# Patient Record
Sex: Female | Born: 1950 | Race: White | Hispanic: No | Smoking: Former smoker
Health system: Southern US, Community
[De-identification: ages and names within clinical notes are randomized; demographics above are authoritative.]

## PROBLEM LIST (undated history)

## (undated) DIAGNOSIS — G43909 Migraine, unspecified, not intractable, without status migrainosus: Secondary | ICD-10-CM

## (undated) DIAGNOSIS — G4730 Sleep apnea, unspecified: Secondary | ICD-10-CM

## (undated) DIAGNOSIS — F419 Anxiety disorder, unspecified: Secondary | ICD-10-CM

## (undated) DIAGNOSIS — M109 Gout, unspecified: Secondary | ICD-10-CM

## (undated) DIAGNOSIS — N183 Chronic kidney disease, stage 3 unspecified: Secondary | ICD-10-CM

## (undated) DIAGNOSIS — G8929 Other chronic pain: Secondary | ICD-10-CM

## (undated) DIAGNOSIS — C50919 Malignant neoplasm of unspecified site of unspecified female breast: Secondary | ICD-10-CM

## (undated) DIAGNOSIS — J449 Chronic obstructive pulmonary disease, unspecified: Secondary | ICD-10-CM

## (undated) DIAGNOSIS — B009 Herpesviral infection, unspecified: Secondary | ICD-10-CM

## (undated) DIAGNOSIS — E119 Type 2 diabetes mellitus without complications: Secondary | ICD-10-CM

## (undated) DIAGNOSIS — K760 Fatty (change of) liver, not elsewhere classified: Secondary | ICD-10-CM

## (undated) DIAGNOSIS — Z8709 Personal history of other diseases of the respiratory system: Secondary | ICD-10-CM

## (undated) DIAGNOSIS — K219 Gastro-esophageal reflux disease without esophagitis: Secondary | ICD-10-CM

## (undated) DIAGNOSIS — I10 Essential (primary) hypertension: Secondary | ICD-10-CM

## (undated) DIAGNOSIS — K449 Diaphragmatic hernia without obstruction or gangrene: Secondary | ICD-10-CM

## (undated) DIAGNOSIS — E669 Obesity, unspecified: Secondary | ICD-10-CM

## (undated) DIAGNOSIS — Z8669 Personal history of other diseases of the nervous system and sense organs: Secondary | ICD-10-CM

## (undated) DIAGNOSIS — E785 Hyperlipidemia, unspecified: Secondary | ICD-10-CM

## (undated) HISTORY — DX: Gout, unspecified: M10.9

## (undated) HISTORY — DX: Gastro-esophageal reflux disease without esophagitis: K21.9

## (undated) HISTORY — PX: CHOLECYSTECTOMY: SHX55

## (undated) HISTORY — DX: Personal history of other diseases of the respiratory system: Z87.09

## (undated) HISTORY — DX: Diaphragmatic hernia without obstruction or gangrene: K44.9

## (undated) HISTORY — DX: Obesity, unspecified: E66.9

## (undated) HISTORY — DX: Chronic obstructive pulmonary disease, unspecified: J44.9

## (undated) HISTORY — PX: MOLE REMOVAL: SHX2046

## (undated) HISTORY — PX: BLADDER SURGERY: SHX569

## (undated) HISTORY — DX: Hyperlipidemia, unspecified: E78.5

## (undated) HISTORY — DX: Herpesviral infection, unspecified: B00.9

## (undated) HISTORY — DX: Personal history of other diseases of the nervous system and sense organs: Z86.69

## (undated) HISTORY — DX: Migraine, unspecified, not intractable, without status migrainosus: G43.909

## (undated) HISTORY — DX: Other chronic pain: G89.29

## (undated) HISTORY — PX: ABDOMINAL HYSTERECTOMY: SHX81

## (undated) HISTORY — DX: Fatty (change of) liver, not elsewhere classified: K76.0

---

## 2003-05-15 ENCOUNTER — Other Ambulatory Visit: Admission: RE | Admit: 2003-05-15 | Discharge: 2003-05-15 | Payer: Self-pay | Admitting: Unknown Physician Specialty

## 2004-11-09 ENCOUNTER — Ambulatory Visit: Payer: Self-pay | Admitting: Internal Medicine

## 2004-11-15 ENCOUNTER — Ambulatory Visit: Payer: Self-pay | Admitting: Internal Medicine

## 2004-11-15 ENCOUNTER — Ambulatory Visit (HOSPITAL_COMMUNITY): Admission: RE | Admit: 2004-11-15 | Discharge: 2004-11-15 | Payer: Self-pay | Admitting: Internal Medicine

## 2004-11-15 DIAGNOSIS — K449 Diaphragmatic hernia without obstruction or gangrene: Secondary | ICD-10-CM

## 2004-11-15 HISTORY — PX: ESOPHAGOGASTRODUODENOSCOPY: SHX1529

## 2004-11-15 HISTORY — DX: Diaphragmatic hernia without obstruction or gangrene: K44.9

## 2004-11-17 ENCOUNTER — Ambulatory Visit (HOSPITAL_COMMUNITY): Admission: RE | Admit: 2004-11-17 | Discharge: 2004-11-17 | Payer: Self-pay | Admitting: Internal Medicine

## 2004-11-17 IMAGING — NM NM HEPATO W/GB/PHARM/[PERSON_NAME]
2 series · 12 of 12 positions shown · non-contrast
Comparison: none

CLINICAL DATA: Nausea.
 NUCLEAR MEDICINE HEPATOBILIARY SCAN
 Hepatobiliary study was performed after the IV administration of 5.0 mCi [6T] Choletec.  Images were performed at different intervals up to one hour.

[Series 1: hepatobiliary · 3.20mm/px · 6 of 60 frames shown (1 of 2)]
[frame 6/60]
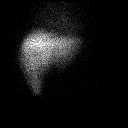
[frame 16/60]
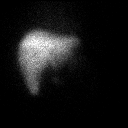
[frame 26/60]
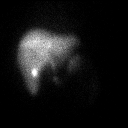
[frame 36/60]
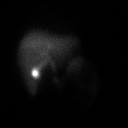
[frame 46/60]
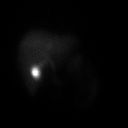
[frame 56/60]
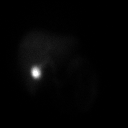

[Series 1: hepatobiliary · 3.20mm/px · 6 of 57 frames shown (2 of 2)]
[frame 5/57]
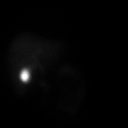
[frame 15/57]
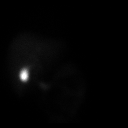
[frame 24/57]
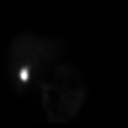
[frame 34/57]
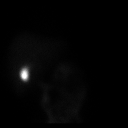
[frame 43/57]
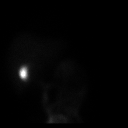
[frame 53/57]
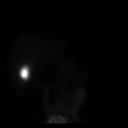

[12 of 12 positions shown; findings below may reference images not displayed]

The liver was seen promptly followed by the biliary ducts and then the gallbladder in the first 22 minutes of the study.  Intestinal activity was seen approximately by 15 minutes.  At the end of one hour, most of the activity had cleared from the liver and was concentrated in the gallbladder and small intestine.  Post fatty meal challenge, there was more passage of the tracer into the small bowel and the gallbladder emptied with an ejection fraction of 9% (normal greater than 35%).
IMPRESSION: Decreased gallbladder ejection fraction of 9% suggestive of a severe degree of biliary dyskinesia /cystic duct syndrome.

## 2010-04-06 ENCOUNTER — Encounter: Payer: Self-pay | Admitting: Cardiology

## 2010-05-05 ENCOUNTER — Encounter: Payer: Self-pay | Admitting: Cardiology

## 2010-07-06 ENCOUNTER — Encounter: Payer: Self-pay | Admitting: Cardiology

## 2010-07-07 ENCOUNTER — Encounter: Payer: Self-pay | Admitting: Cardiology

## 2010-07-07 ENCOUNTER — Ambulatory Visit: Payer: Self-pay | Admitting: Cardiology

## 2010-07-22 ENCOUNTER — Encounter: Payer: Self-pay | Admitting: Cardiology

## 2010-08-24 ENCOUNTER — Ambulatory Visit: Payer: Self-pay | Admitting: Cardiology

## 2010-08-24 DIAGNOSIS — E785 Hyperlipidemia, unspecified: Secondary | ICD-10-CM | POA: Insufficient documentation

## 2010-08-24 DIAGNOSIS — E7800 Pure hypercholesterolemia, unspecified: Secondary | ICD-10-CM | POA: Insufficient documentation

## 2010-08-24 DIAGNOSIS — E119 Type 2 diabetes mellitus without complications: Secondary | ICD-10-CM | POA: Insufficient documentation

## 2010-08-24 DIAGNOSIS — K219 Gastro-esophageal reflux disease without esophagitis: Secondary | ICD-10-CM | POA: Insufficient documentation

## 2010-08-24 DIAGNOSIS — G4730 Sleep apnea, unspecified: Secondary | ICD-10-CM | POA: Insufficient documentation

## 2010-08-24 DIAGNOSIS — R079 Chest pain, unspecified: Secondary | ICD-10-CM | POA: Insufficient documentation

## 2010-08-24 DIAGNOSIS — I10 Essential (primary) hypertension: Secondary | ICD-10-CM | POA: Insufficient documentation

## 2010-08-24 DIAGNOSIS — E118 Type 2 diabetes mellitus with unspecified complications: Secondary | ICD-10-CM | POA: Insufficient documentation

## 2010-08-24 DIAGNOSIS — J45909 Unspecified asthma, uncomplicated: Secondary | ICD-10-CM | POA: Insufficient documentation

## 2010-11-16 NOTE — Consult Note (Signed)
Summary: CARDIOLOGY CONSULT/ [facility]  CARDIOLOGY CONSULT/ [facility]   Imported By: Zachary George 08/23/2010 16:46:10  _____________________________________________________________________  External Attachment:    Type:   Image     Comment:   External Document

## 2010-11-16 NOTE — Letter (Signed)
Summary: Discharge Summary/ [facility]  Discharge Summary/ [facility]   Imported By: Dorise Hiss 08/24/2010 08:41:55  _____________________________________________________________________  External Attachment:    Type:   Image     Comment:   External Document

## 2010-11-16 NOTE — Letter (Signed)
Summary: Internal Other/ PATIENT HISTORY FORM  Internal Other/ PATIENT HISTORY FORM   Imported By: Dorise Hiss 08/26/2010 09:06:13  _____________________________________________________________________  External Attachment:    Type:   Image     Comment:   External Document

## 2010-11-16 NOTE — Letter (Signed)
Summary: External Correspondence/ OFFICE VISIT DR. HASANAJ  External Correspondence/ OFFICE VISIT DR. HASANAJ   Imported By: Dorise Hiss 08/16/2010 12:19:09  _____________________________________________________________________  External Attachment:    Type:   Image     Comment:   External Document
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
## 2011-03-04 NOTE — Op Note (Signed)
Beth Phelps, Beth Phelps                ACCOUNT NO.:  [id number]   MEDICAL RECORD NO.:  [id number]          PATIENT TYPE:  AMB   LOCATION:  DAY                           FACILITY:  [facility]   PHYSICIAN:  Lionel December, M.D.    DATE OF BIRTH:  1951/02/14   DATE OF PROCEDURE:  11/15/2004  DATE OF DISCHARGE:                                 OPERATIVE REPORT   PROCEDURE:  Esophagogastroduodenoscopy.   INDICATIONS:  Beth Phelps is a 60 year old Caucasian female with nausea of four  months' duration associated with postprandial abdominal pain.  She has  chronic GERD and her symptoms are well controlled with PPI, but not her  other symptoms.  She had an ultrasound recently revealing a fatty liver, but  no evidence of cholelithiasis or dilated bile duct.  Her LFTs are  essentially normal, except for borderline alkaline phosphatase.  Her AST was  19 and ALT 22 (November 09, 2004).  Her CBC was normal, except for a WBC of  11 and an MCV of 79.3.  H&H was 13.5 and 41.1.  She is undergoing diagnostic  EGD.  The procedure risks were reviewed with the patient and informed  consent was obtained.   PREOPERATIVE MEDICATIONS:  Cetacaine spray for pharyngeal topical anesthesia  and Demerol 25 mg IV and Versed 6 mg IV in divided dose.   FINDINGS:  Procedure performed in endoscopy suite.  The patient's vital  signs and O2 saturations were monitored during the procedure and remained  stable.  The patient was placed in the left lateral decubitus position and  the Olympus was passed per oropharynx without any difficulty into the  esophagus.   Esophagus:  Mucosa of the esophagus normal.  The squamocolumnar junction was  located at 33 cm from the incisors.  There was focal erythema at the GE  junction.  The hiatus was at 37 cm from the incisors.   Stomach:  It was empty and distended for evaluation insufflation.  Folds of  the proximal stomach were normal.  Examination of the mucosa revealed normal  appearance of body,  antrum, pyloric channel, as well as angularis, fundus  and cardia.   Duodenal and bulbar mucosa were normal.  The scope was passed to the second  part of the duodenum where mucosa and folds were normal.  The endoscope was  withdrawn.  The patient tolerated the procedure well.   FINAL DIAGNOSES:  1.  Small sliding hiatal hernia with mild changes of reflux esophagitis      limited to gastroesophageal junction.  2.  No evidence of peptic ulcer disease or complicated gastrointestinal      reflux disease.  3.  Suspect her nausea may be part of her depression given postprandial      pain.  Would rule out biliary dyskinesia.   RECOMMENDATIONS:  1.  Levsin SL before each meal.  2.  HIDA scan with CCK.  The patient was advised not to take any Levsin for      24 hours prior to this procedure.      NR/MEDQ  D:  11/15/2004  T:  11/15/2004  Job:  [id number]   cc:   Earleen Newport, P.A.  [facility] Department
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
## 2011-04-14 ENCOUNTER — Encounter: Payer: Self-pay | Admitting: Cardiology

## 2012-02-28 ENCOUNTER — Ambulatory Visit: Payer: Self-pay | Admitting: Internal Medicine

## 2012-04-03 ENCOUNTER — Telehealth: Payer: Self-pay | Admitting: Internal Medicine

## 2012-04-03 ENCOUNTER — Ambulatory Visit: Payer: Self-pay | Admitting: Internal Medicine

## 2012-04-03 NOTE — Telephone Encounter (Signed)
Pt was a no show

## 2012-09-16 HISTORY — PX: COLONOSCOPY: SHX174

## 2013-02-03 DIAGNOSIS — R059 Cough, unspecified: Secondary | ICD-10-CM

## 2013-02-03 DIAGNOSIS — R05 Cough: Secondary | ICD-10-CM

## 2014-03-25 ENCOUNTER — Other Ambulatory Visit: Payer: Self-pay | Admitting: Internal Medicine

## 2014-03-25 DIAGNOSIS — N63 Unspecified lump in unspecified breast: Secondary | ICD-10-CM

## 2014-04-03 ENCOUNTER — Other Ambulatory Visit: Payer: Self-pay | Admitting: Internal Medicine

## 2014-04-03 ENCOUNTER — Ambulatory Visit
Admission: RE | Admit: 2014-04-03 | Discharge: 2014-04-03 | Disposition: A | Discharge status: Home / Self Care | Payer: Medicare Other | Source: Ambulatory Visit | Attending: Internal Medicine | Admitting: Internal Medicine

## 2014-04-03 ENCOUNTER — Encounter (INDEPENDENT_AMBULATORY_CARE_PROVIDER_SITE_OTHER): Payer: Self-pay

## 2014-04-03 DIAGNOSIS — N63 Unspecified lump in unspecified breast: Secondary | ICD-10-CM

## 2014-05-07 ENCOUNTER — Emergency Department (HOSPITAL_COMMUNITY): Payer: Medicare Other

## 2014-05-07 ENCOUNTER — Observation Stay (HOSPITAL_COMMUNITY)
Admission: EM | Admit: 2014-05-07 | Discharge: 2014-05-09 | Disposition: A | Discharge status: Home / Self Care | Payer: Medicare Other | Attending: Family Medicine | Admitting: Family Medicine

## 2014-05-07 ENCOUNTER — Encounter (HOSPITAL_COMMUNITY): Payer: Self-pay | Admitting: Emergency Medicine

## 2014-05-07 DIAGNOSIS — Z87891 Personal history of nicotine dependence: Secondary | ICD-10-CM | POA: Diagnosis not present

## 2014-05-07 DIAGNOSIS — K219 Gastro-esophageal reflux disease without esophagitis: Secondary | ICD-10-CM | POA: Diagnosis not present

## 2014-05-07 DIAGNOSIS — R1032 Left lower quadrant pain: Principal | ICD-10-CM | POA: Insufficient documentation

## 2014-05-07 DIAGNOSIS — K625 Hemorrhage of anus and rectum: Secondary | ICD-10-CM | POA: Diagnosis not present

## 2014-05-07 DIAGNOSIS — J45909 Unspecified asthma, uncomplicated: Secondary | ICD-10-CM | POA: Insufficient documentation

## 2014-05-07 DIAGNOSIS — Z79899 Other long term (current) drug therapy: Secondary | ICD-10-CM | POA: Diagnosis not present

## 2014-05-07 DIAGNOSIS — R1031 Right lower quadrant pain: Secondary | ICD-10-CM | POA: Diagnosis not present

## 2014-05-07 DIAGNOSIS — K559 Vascular disorder of intestine, unspecified: Secondary | ICD-10-CM

## 2014-05-07 DIAGNOSIS — Z90710 Acquired absence of both cervix and uterus: Secondary | ICD-10-CM | POA: Insufficient documentation

## 2014-05-07 DIAGNOSIS — E669 Obesity, unspecified: Secondary | ICD-10-CM | POA: Insufficient documentation

## 2014-05-07 DIAGNOSIS — E119 Type 2 diabetes mellitus without complications: Secondary | ICD-10-CM

## 2014-05-07 DIAGNOSIS — G8929 Other chronic pain: Secondary | ICD-10-CM | POA: Diagnosis not present

## 2014-05-07 DIAGNOSIS — M7989 Other specified soft tissue disorders: Secondary | ICD-10-CM | POA: Diagnosis not present

## 2014-05-07 DIAGNOSIS — N390 Urinary tract infection, site not specified: Secondary | ICD-10-CM

## 2014-05-07 DIAGNOSIS — Z9089 Acquired absence of other organs: Secondary | ICD-10-CM | POA: Diagnosis not present

## 2014-05-07 DIAGNOSIS — Z8669 Personal history of other diseases of the nervous system and sense organs: Secondary | ICD-10-CM | POA: Diagnosis not present

## 2014-05-07 DIAGNOSIS — K5900 Constipation, unspecified: Secondary | ICD-10-CM | POA: Insufficient documentation

## 2014-05-07 DIAGNOSIS — R1030 Lower abdominal pain, unspecified: Secondary | ICD-10-CM

## 2014-05-07 DIAGNOSIS — N3000 Acute cystitis without hematuria: Secondary | ICD-10-CM

## 2014-05-07 DIAGNOSIS — R109 Unspecified abdominal pain: Secondary | ICD-10-CM | POA: Diagnosis present

## 2014-05-07 DIAGNOSIS — E118 Type 2 diabetes mellitus with unspecified complications: Secondary | ICD-10-CM | POA: Diagnosis present

## 2014-05-07 DIAGNOSIS — Z8619 Personal history of other infectious and parasitic diseases: Secondary | ICD-10-CM | POA: Diagnosis not present

## 2014-05-07 DIAGNOSIS — R197 Diarrhea, unspecified: Secondary | ICD-10-CM

## 2014-05-07 DIAGNOSIS — K922 Gastrointestinal hemorrhage, unspecified: Secondary | ICD-10-CM | POA: Diagnosis present

## 2014-05-07 HISTORY — DX: Essential (primary) hypertension: I10

## 2014-05-07 LAB — CBC WITH DIFFERENTIAL/PLATELET
Basophils Absolute: 0 10*3/uL (ref 0.0–0.1)
Basophils Relative: 0 % (ref 0–1)
Eosinophils Absolute: 0.2 10*3/uL (ref 0.0–0.7)
Eosinophils Relative: 2 % (ref 0–5)
HCT: 41.7 % (ref 36.0–46.0)
Hemoglobin: 13.8 g/dL (ref 12.0–15.0)
Lymphocytes Relative: 31 % (ref 12–46)
Lymphs Abs: 2.8 10*3/uL (ref 0.7–4.0)
MCH: 27.8 pg (ref 26.0–34.0)
MCHC: 33.1 g/dL (ref 30.0–36.0)
MCV: 84.1 fL (ref 78.0–100.0)
Monocytes Absolute: 0.6 10*3/uL (ref 0.1–1.0)
Monocytes Relative: 7 % (ref 3–12)
Neutro Abs: 5.5 10*3/uL (ref 1.7–7.7)
Neutrophils Relative %: 60 % (ref 43–77)
Platelets: 252 10*3/uL (ref 150–400)
RBC: 4.96 MIL/uL (ref 3.87–5.11)
RDW: 13.9 % (ref 11.5–15.5)
WBC: 9.1 10*3/uL (ref 4.0–10.5)

## 2014-05-07 LAB — BASIC METABOLIC PANEL
Anion gap: 13 (ref 5–15)
BUN: 12 mg/dL (ref 6–23)
CO2: 29 mEq/L (ref 19–32)
Calcium: 9.7 mg/dL (ref 8.4–10.5)
Chloride: 97 mEq/L (ref 96–112)
Creatinine, Ser: 0.81 mg/dL (ref 0.50–1.10)
GFR calc Af Amer: 88 mL/min — ABNORMAL LOW (ref >90)
GFR calc non Af Amer: 76 mL/min — ABNORMAL LOW (ref >90)
Glucose, Bld: 139 mg/dL — ABNORMAL HIGH (ref 70–99)
Potassium: 3.8 mEq/L (ref 3.7–5.3)
Sodium: 139 mEq/L (ref 137–147)

## 2014-05-07 LAB — TYPE AND SCREEN
ABO/RH(D): A POS
Antibody Screen: NEGATIVE

## 2014-05-07 LAB — HEPATIC FUNCTION PANEL
ALT: 24 U/L (ref 0–35)
AST: 21 U/L (ref 0–37)
Albumin: 3.5 g/dL (ref 3.5–5.2)
Alkaline Phosphatase: 91 U/L (ref 39–117)
Bilirubin, Direct: <0.2 mg/dL (ref 0.0–0.3)
Total Bilirubin: 0.4 mg/dL (ref 0.3–1.2)
Total Protein: 7.7 g/dL (ref 6.0–8.3)

## 2014-05-07 LAB — GLUCOSE, CAPILLARY
Glucose-Capillary: 125 mg/dL — ABNORMAL HIGH (ref 70–99)
Glucose-Capillary: 97 mg/dL (ref 70–99)

## 2014-05-07 LAB — CBC
HCT: 38.9 % (ref 36.0–46.0)
Hemoglobin: 12.8 g/dL (ref 12.0–15.0)
MCH: 27.8 pg (ref 26.0–34.0)
MCHC: 32.9 g/dL (ref 30.0–36.0)
MCV: 84.6 fL (ref 78.0–100.0)
Platelets: 242 10*3/uL (ref 150–400)
RBC: 4.6 MIL/uL (ref 3.87–5.11)
RDW: 14.1 % (ref 11.5–15.5)
WBC: 9.4 10*3/uL (ref 4.0–10.5)

## 2014-05-07 LAB — PRO B NATRIURETIC PEPTIDE: Pro B Natriuretic peptide (BNP): 33.1 pg/mL (ref 0–125)

## 2014-05-07 LAB — LIPASE, BLOOD: Lipase: 18 U/L (ref 11–59)

## 2014-05-07 LAB — MRSA PCR SCREENING: MRSA by PCR: NEGATIVE

## 2014-05-07 IMAGING — CR DG CHEST 2V
2 series · 2 of 2 positions shown · non-contrast
Comparison: Prior chest x-ray [DATE]; CT abdomen/ pelvis
performed today [DATE]

CLINICAL DATA: Four day history of cough, former smoker

EXAM:
CHEST  2 VIEW

[view not recorded (1 of 2)]
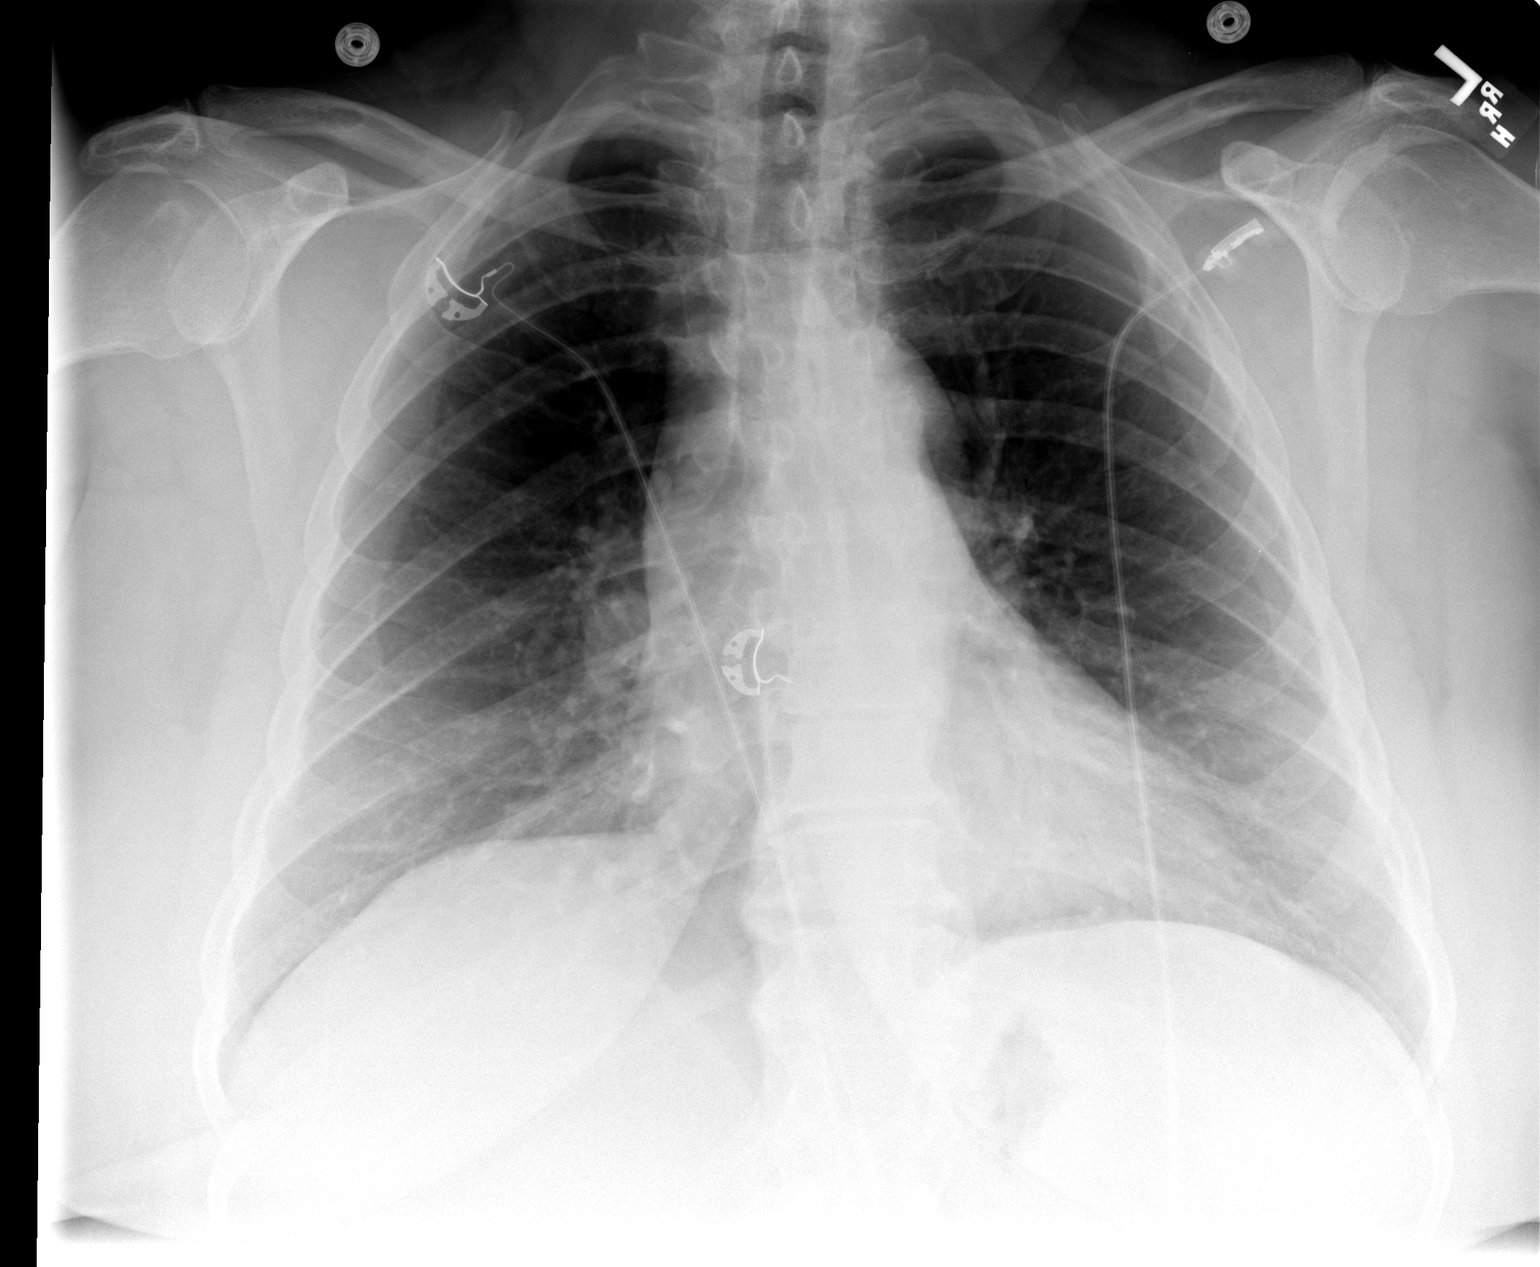

[view not recorded (2 of 2)]
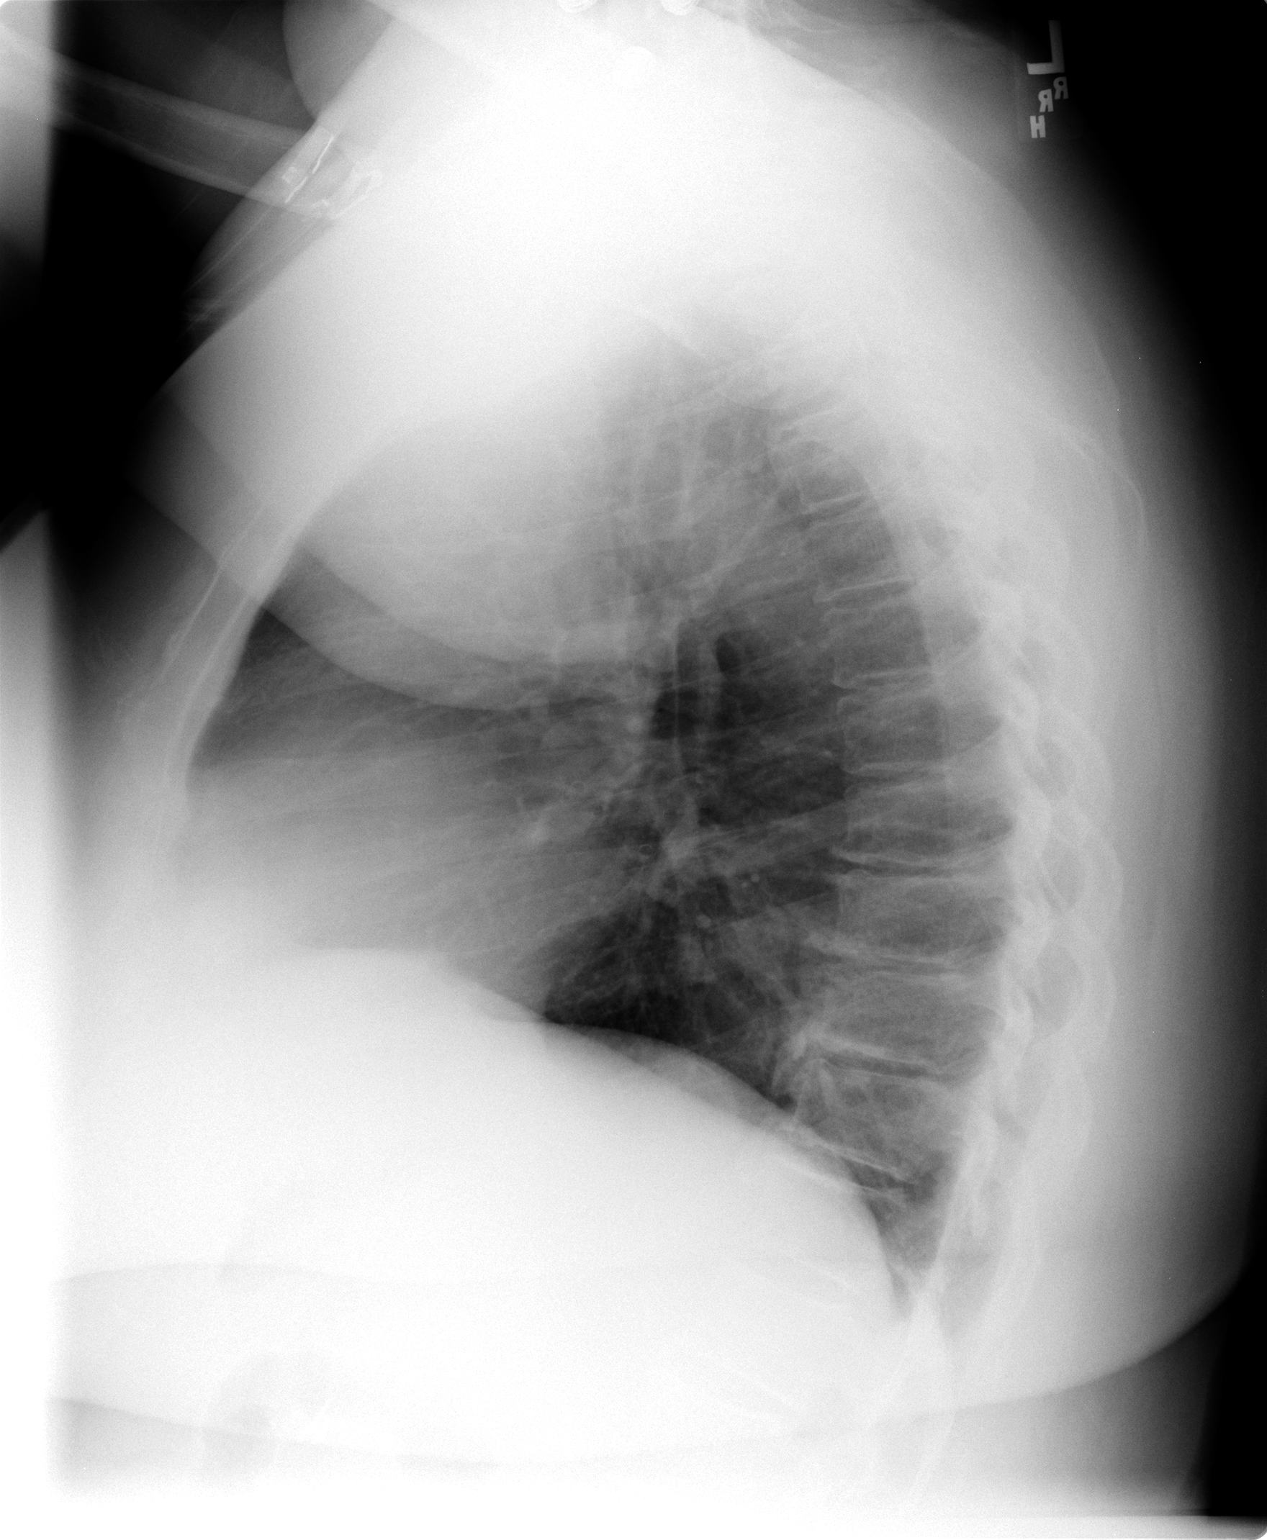

[2 of 2 positions shown; findings below may reference images not displayed]

FINDINGS: Stable cardiac and mediastinal contours which are within normal
limits. The lungs are clear. No focal airspace consolidation,
pleural effusion or pneumothorax. No suspicious pulmonary nodule or
mass. No acute osseous abnormality.
IMPRESSION: No active cardiopulmonary disease.

## 2014-05-07 IMAGING — CT CT ABD-PELV W/ CM
2 of 5 series · 16 of 46 positions shown, 18 images · IV contrast (Omnipaque 300)
Comparison: None.

CLINICAL DATA: Abdominal pain, nausea

EXAM:
CT ABDOMEN AND PELVIS WITH CONTRAST
TECHNIQUE: Multidetector CT imaging of the abdomen and pelvis was performed
using the standard protocol following bolus administration of
intravenous contrast.
CONTRAST:  50mL OMNIPAQUE IOHEXOL 300 MG/ML SOLN, 100mL OMNIPAQUE
IOHEXOL 300 MG/ML SOLN

[Series 2: abd_pel_with 5.0 b40f · axial · 0.88mm/px · z∈[-478,-64]mm · 13 of 95 slices shown, 15 images]
[im 6/95  soft-tissue]
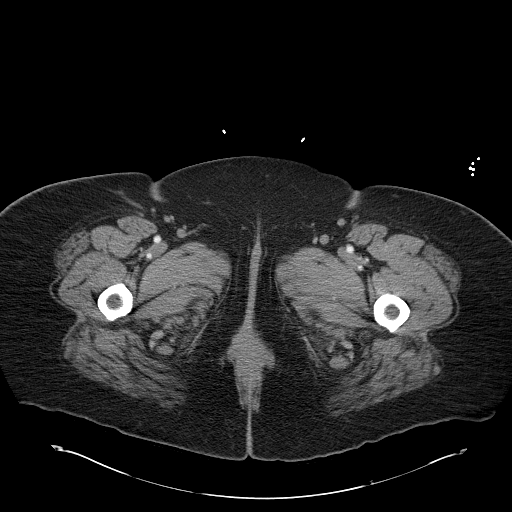
[im 6/95  bone]
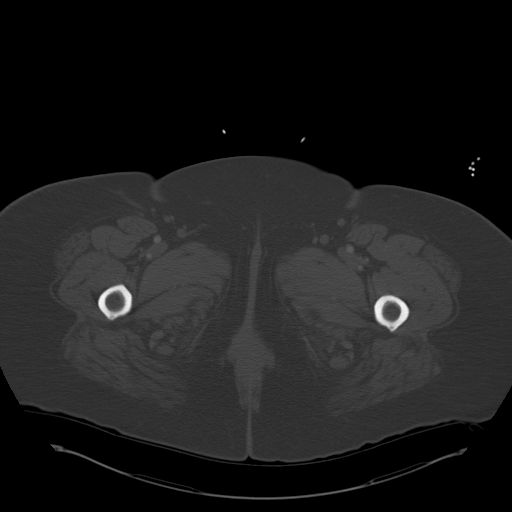
[im 12/95  soft-tissue]
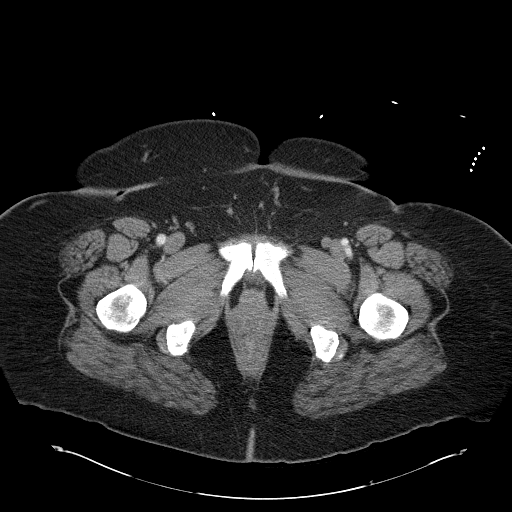
[im 23/95  soft-tissue]
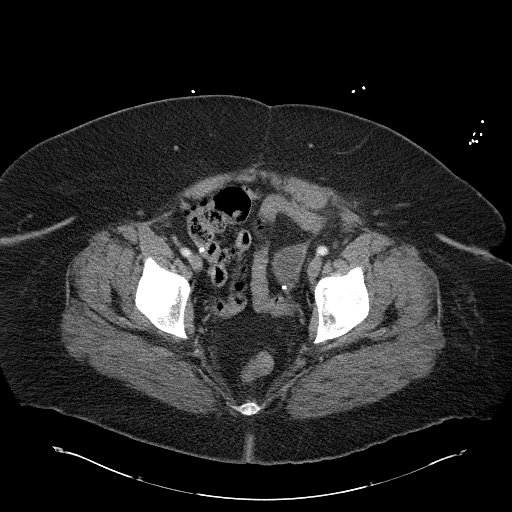
[im 28/95  soft-tissue]
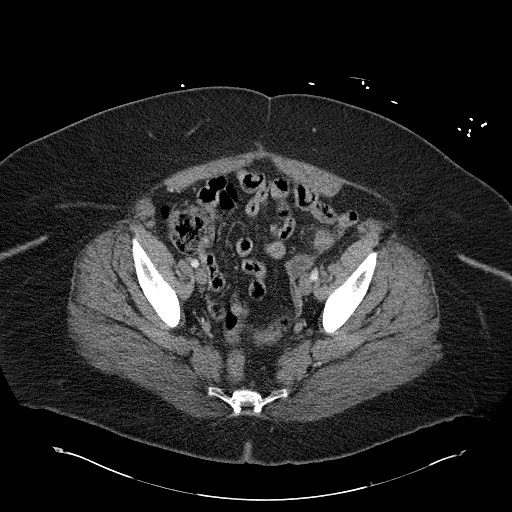
[im 34/95  soft-tissue]
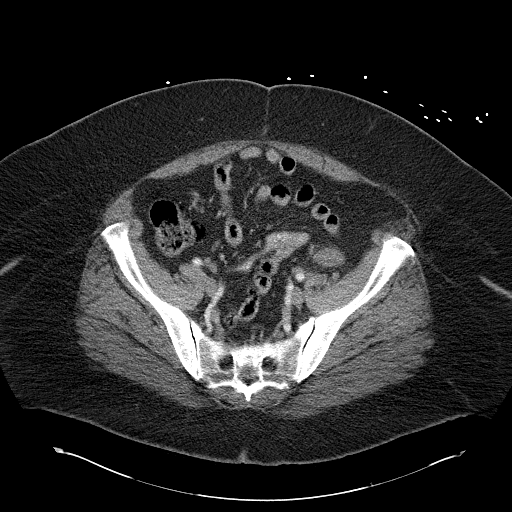
[im 39/95  soft-tissue]
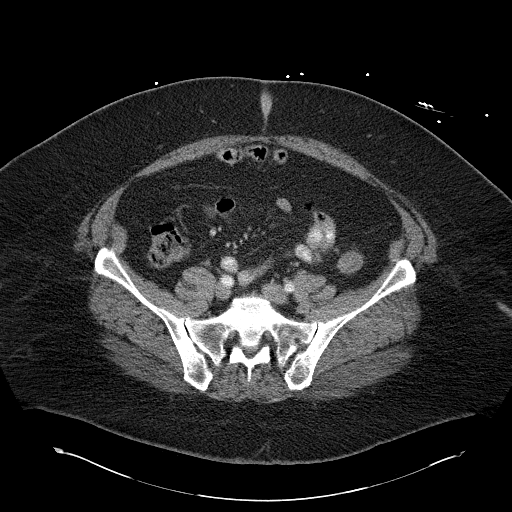
[im 50/95  soft-tissue]
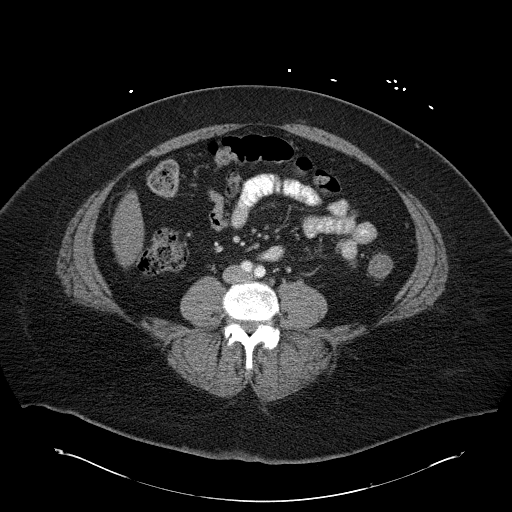
[im 56/95  soft-tissue]
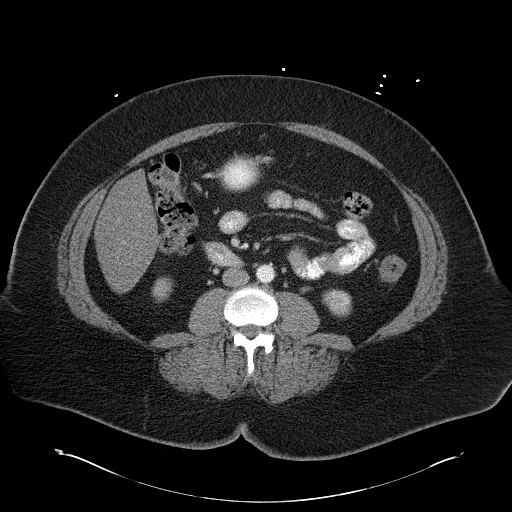
[im 61/95  soft-tissue]
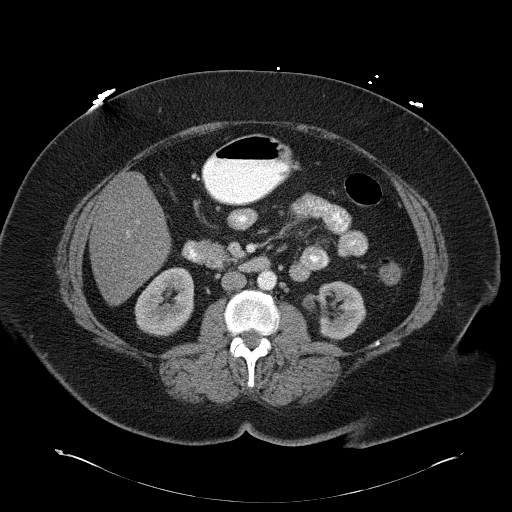
[im 61/95  bone]
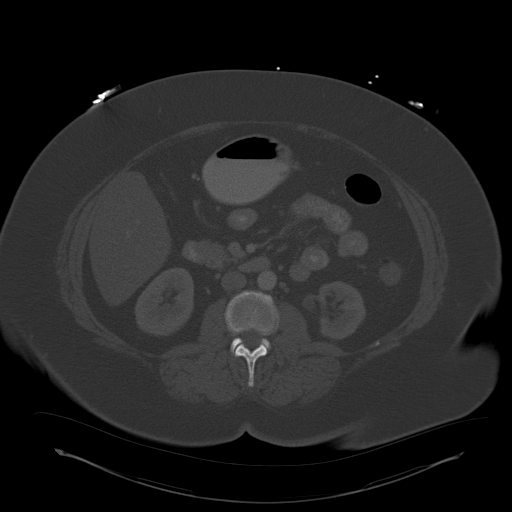
[im 67/95  soft-tissue]
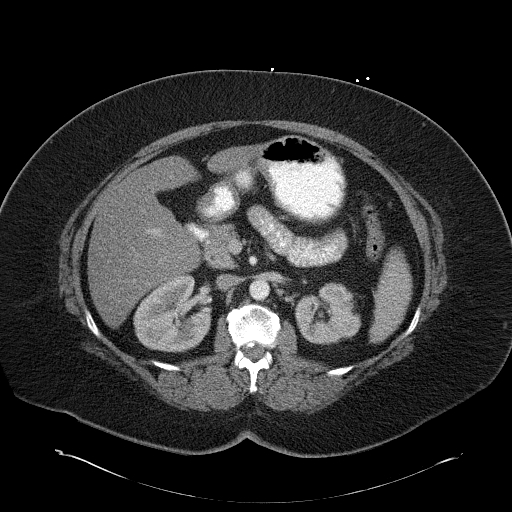
[im 72/95  soft-tissue]
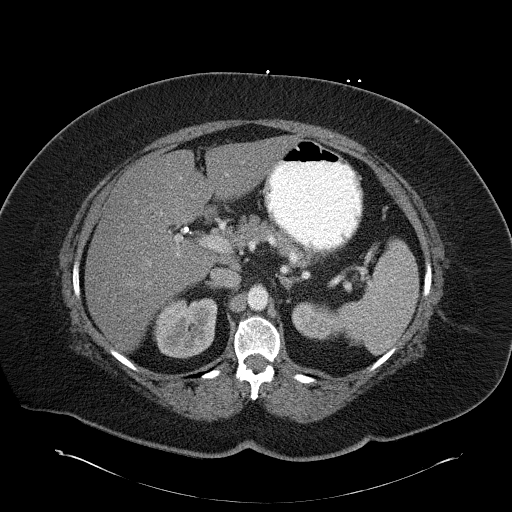
[im 83/95  soft-tissue]
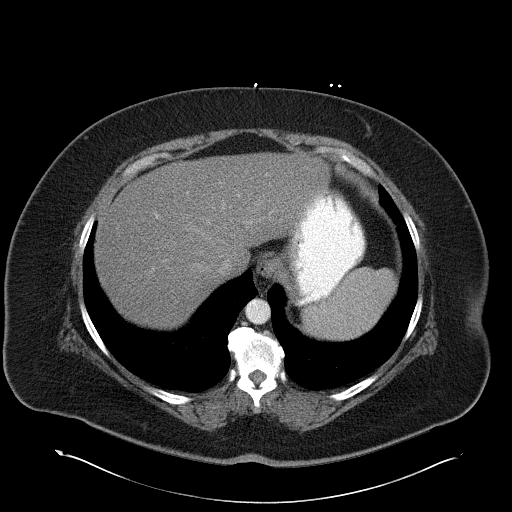
[im 89/95  soft-tissue]
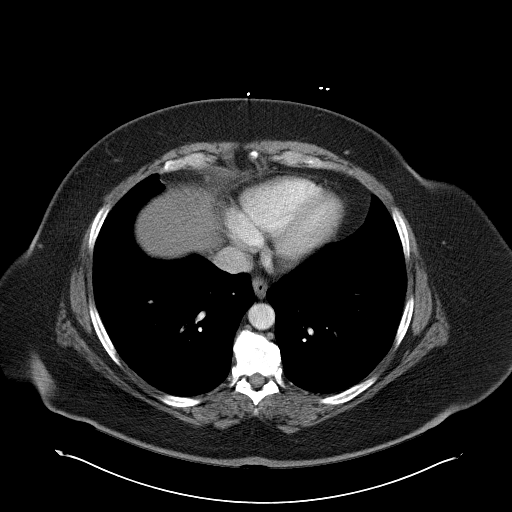

[Series 3: abd_pel_with 3.0 spo cor · coronal · 0.90mm/px · 3 of 97 slices shown]
[im 33/97  soft-tissue]
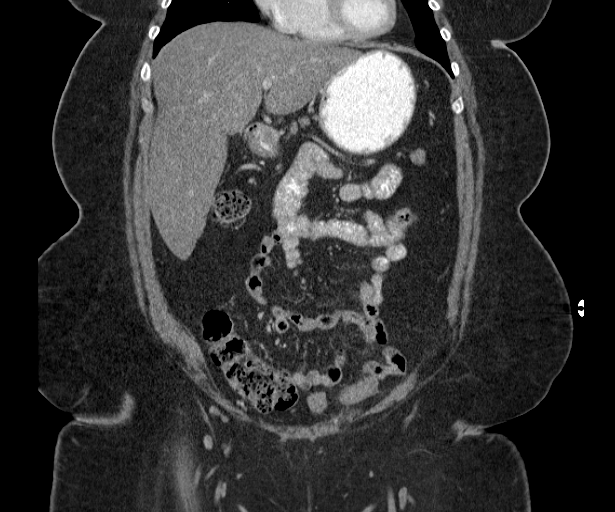
[im 43/97  soft-tissue]
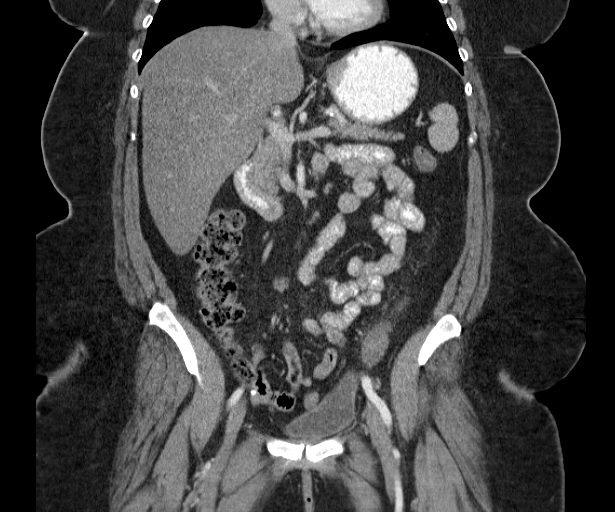
[im 54/97  soft-tissue]
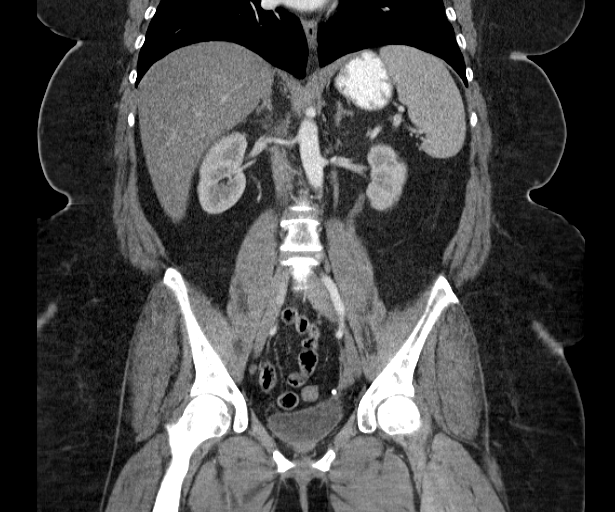

[16 of 46 positions shown; findings below may reference images not displayed]

FINDINGS: Lower Chest: The lung bases are clear. Visualized cardiac structures
are within normal limits for size. No pericardial effusion.
Unremarkable visualized distal thoracic esophagus.

Abdomen: Unremarkable CT appearance of the stomach, duodenum,
spleen, adrenal glands and pancreas. Diffuse hypoattenuation of the
hepatic parenchyma consistent with at least moderate steatosis. No
discrete hepatic lesion. The gallbladder is surgically absent. No
intra or extrahepatic biliary ductal dilatation. Unremarkable
appearance of the bilateral kidneys. No focal solid lesion,
hydronephrosis or nephrolithiasis. Subtle regions of focal cortical
thinning in the superior aspect of the left kidney suggests areas of
cortical scarring, perhaps from prior infection.

No focal bowel wall thickening or evidence of obstruction. The
appendix is not clearly identified however, there is no significant
inflammatory change in the right lower quadrant to suggest
appendicitis.

Pelvis: Surgical changes of prior cholecystectomy. The the bladder
dome is patulous and extends upward to be the left ureter consistent
with surgical changes of prior psoas BORRES procedure. The bilateral
ovaries are unremarkable. No free fluid or suspicious adenopathy.

Bones/Soft Tissues: No acute fracture or aggressive appearing lytic
or blastic osseous lesion.

Vascular: No significant atherosclerotic vascular disease,
aneurysmal dilatation or acute abnormality.
IMPRESSION: 1. No acute abnormality in the abdomen or pelvis to explain the
patient's clinical symptoms.
2. Surgical changes of prior cholecystectomy, hysterectomy and left
psoas BORRES ureteroneocystostomy procedure.
3. Small foci of left upper pole renal cortical scarring likely
secondary to remote reflux nephropathy or pyelonephritis.
4. The appendix is not clearly identified and may also be surgically
absent. No inflammatory change of the right lower quadrant to
suggest appendicitis.

## 2014-05-07 MED ORDER — ACETAMINOPHEN 325 MG PO TABS
650.0000 mg | ORAL_TABLET | Freq: Four times a day (QID) | ORAL | Status: DC | PRN
  Administered 2014-05-07 – 2014-05-09 (×4): 650 mg via ORAL
  Filled 2014-05-07 (×4): qty 2

## 2014-05-07 MED ORDER — ALUM & MAG HYDROXIDE-SIMETH 200-200-20 MG/5ML PO SUSP
15.0000 mL | ORAL | Status: DC | PRN
Start: 2014-05-07 — End: 2014-05-09
  Administered 2014-05-08: 15 mL via ORAL
  Filled 2014-05-07: qty 30

## 2014-05-07 MED ORDER — SODIUM CHLORIDE 0.9 % IV SOLN
INTRAVENOUS | Status: DC
  Administered 2014-05-07 – 2014-05-08 (×4): via INTRAVENOUS

## 2014-05-07 MED ORDER — IOHEXOL 300 MG/ML  SOLN
100.0000 mL | Freq: Once | INTRAMUSCULAR | Status: AC | PRN
  Administered 2014-05-07: 100 mL via INTRAVENOUS

## 2014-05-07 MED ORDER — SODIUM CHLORIDE 0.9 % IV SOLN: INTRAVENOUS | Status: DC

## 2014-05-07 MED ORDER — METOPROLOL TARTRATE 50 MG PO TABS
100.0000 mg | ORAL_TABLET | Freq: Every day | ORAL | Status: DC
  Administered 2014-05-08 – 2014-05-09 (×2): 100 mg via ORAL
  Filled 2014-05-07 (×2): qty 2

## 2014-05-07 MED ORDER — IOHEXOL 300 MG/ML  SOLN
50.0000 mL | Freq: Once | INTRAMUSCULAR | Status: AC | PRN
Start: 2014-05-07 — End: 2014-05-07
  Administered 2014-05-07: 50 mL via ORAL

## 2014-05-07 MED ORDER — ACETAMINOPHEN 650 MG RE SUPP: 650.0000 mg | Freq: Four times a day (QID) | RECTAL | Status: DC | PRN

## 2014-05-07 MED ORDER — ONDANSETRON HCL 4 MG PO TABS: 4.0000 mg | ORAL_TABLET | Freq: Four times a day (QID) | ORAL | Status: DC | PRN

## 2014-05-07 MED ORDER — ESCITALOPRAM OXALATE 10 MG PO TABS
20.0000 mg | ORAL_TABLET | Freq: Every day | ORAL | Status: DC
  Administered 2014-05-08 – 2014-05-09 (×2): 20 mg via ORAL
  Filled 2014-05-07 (×2): qty 2

## 2014-05-07 MED ORDER — PANTOPRAZOLE SODIUM 40 MG PO TBEC
40.0000 mg | DELAYED_RELEASE_TABLET | Freq: Every day | ORAL | Status: DC
  Administered 2014-05-07 – 2014-05-09 (×3): 40 mg via ORAL
  Filled 2014-05-07 (×3): qty 1

## 2014-05-07 MED ORDER — INSULIN ASPART 100 UNIT/ML ~~LOC~~ SOLN
0.0000 [IU] | Freq: Three times a day (TID) | SUBCUTANEOUS | Status: DC
  Administered 2014-05-09: 1 [IU] via SUBCUTANEOUS

## 2014-05-07 MED ORDER — SODIUM CHLORIDE 0.9 % IV BOLUS (SEPSIS)
250.0000 mL | Freq: Once | INTRAVENOUS | Status: AC
  Administered 2014-05-07: 1000 mL via INTRAVENOUS

## 2014-05-07 MED ORDER — HYDROCHLOROTHIAZIDE 25 MG PO TABS
25.0000 mg | ORAL_TABLET | Freq: Every day | ORAL | Status: DC
Start: 2014-05-08 — End: 2014-05-09
  Administered 2014-05-08 – 2014-05-09 (×2): 25 mg via ORAL
  Filled 2014-05-07 (×2): qty 1

## 2014-05-07 MED ORDER — ONDANSETRON HCL 4 MG/2ML IJ SOLN: 4.0000 mg | Freq: Four times a day (QID) | INTRAMUSCULAR | Status: DC | PRN

## 2014-05-07 MED ORDER — SODIUM CHLORIDE 0.9 % IJ SOLN
INTRAMUSCULAR | Status: AC
  Filled 2014-05-07: qty 500

## 2014-05-07 NOTE — ED Notes (Signed)
abd pain and rectal bleeding since last night. Denies n/v. Eating cramps stomach. 6-7 Episodes of bright red blood. States just bloody mucus coming out now. Alert/oriented. No gen weakness noted. Mm wet

## 2014-05-07 NOTE — ED Provider Notes (Signed)
CSN: [id number]     Arrival date & time 05/07/14  1013 History  This chart was scribed for Fredia Sorrow, MD by Peyton Bottoms, ED Scribe. This patient was seen in room APA02/APA02 and the patient's care was started at 10:58 AM.  Chief Complaint  Patient presents with  . Abdominal Pain   Patient is a 63 y.o. female presenting with abdominal pain. The history is provided by the patient. No language interpreter was used.  Abdominal Pain Pain location:  LLQ and RLQ Pain quality: cramping   Pain radiates to:  Does not radiate Pain severity:  Moderate Onset quality:  Unable to specify Duration:  2 days Timing:  Intermittent Progression:  Unchanged Chronicity:  New Relieved by:  None tried Exacerbated by: eating. Ineffective treatments:  None tried Associated symptoms: constipation, cough and diarrhea   Associated symptoms: no chest pain, no chills, no dysuria, no fever, no nausea, no shortness of breath, no sore throat and no vomiting    HPI Comments: Beth Phelps is a 63 y.o. female with prior who presents to the Emergency Department complaining of 6-7 episodes of red blood mixed in stool onset at 7pm last night. Patient states the blood was mixed in with her stool and she didn't notice the toilet water water turn red. Patient states she has had constipation for the past 2 months. Patient has prior history of similar episode of rectal bleeding 6 years ago at which time she was diagnosed with a colon polyp. Patient also has had associated lower abdominal pain onset at 3pm yesterday.  Patient describes abdominal pain as intermittent, non-radiating, cramping and rates pain as 8/10. Patient states pain is worsened with eating. Patient denies nausea and vomiting.    Past Medical History  Diagnosis Date  . History of sleep apnea   . Diabetes mellitus     type 2  . History of asthma   . GERD (gastroesophageal reflux disease)   . Chronic pain   . Hyperlipidemia   . Obesity   . Fatty  liver   . Hiatal hernia 11/15/2004    small  . Migraine   . Herpes    Past Surgical History  Procedure Laterality Date  . Abdominal hysterectomy    . Cholecystectomy    . Bladder surgery    . Mole removal      malignant  . Esophagogastroduodenoscopy  11/15/2004    Dr. Laural Golden- small sliding hiatal hernia with mild changes of reflux esophagitis   Family History  Problem Relation Age of Onset  . Heart disease Neg Hx    History  Substance Use Topics  . Smoking status: Former Research scientist (life sciences)  . Smokeless tobacco: Not on file  . Alcohol Use: No   OB History   Grav Para Term Preterm Abortions TAB SAB Ect Mult Living                 Review of Systems  Constitutional: Negative for fever and chills.  HENT: Negative for rhinorrhea and sore throat.   Eyes: Negative for visual disturbance.  Respiratory: Positive for cough. Negative for shortness of breath.   Cardiovascular: Positive for leg swelling. Negative for chest pain.  Gastrointestinal: Positive for abdominal pain (lower quadrants), diarrhea and constipation. Negative for nausea and vomiting.  Genitourinary: Negative for dysuria.  Musculoskeletal: Negative for back pain.  Skin: Negative for rash.  Neurological: Negative for dizziness, light-headedness and headaches.  Hematological: Does not bruise/bleed easily.  Psychiatric/Behavioral: Negative for confusion.  Allergies  Review of patient's allergies indicates no known allergies.  Home Medications   Prior to Admission medications   Medication Sig Start Date End Date Taking? Authorizing Provider  escitalopram (LEXAPRO) 20 MG tablet Take 20 mg by mouth daily.     Yes Historical Provider, MD  esomeprazole (NEXIUM) 40 MG capsule Take 40 mg by mouth daily before breakfast.     Yes Historical Provider, MD  hydrochlorothiazide 25 MG tablet Take 25 mg by mouth daily.     Yes Historical Provider, MD  metFORMIN (GLUCOPHAGE) 500 MG tablet Take 500 mg by mouth daily.     Yes Historical  Provider, MD  metoprolol (LOPRESSOR) 50 MG tablet Take 100 mg by mouth daily.    Yes Historical Provider, MD   Triage Vitals: BP 155/116  Pulse 93  Temp(Src) 98.1 F (36.7 C) (Oral)  Resp 20  Ht 5\' 3"  (1.6 m)  Wt 286 lb (129.729 kg)  BMI 50.68 kg/m2  SpO2 98% Physical Exam  Nursing note and vitals reviewed. Constitutional: She is oriented to person, place, and time. She appears well-developed and well-nourished. No distress.  HENT:  Head: Normocephalic and atraumatic.  Eyes: Conjunctivae and EOM are normal.  Neck: Neck supple. No tracheal deviation present.  Cardiovascular: Normal rate and regular rhythm.   Pulmonary/Chest: Effort normal and breath sounds normal. No respiratory distress. She has no wheezes. She has no rales.  Lungs CTA  Abdominal: Bowel sounds are normal. There is no tenderness.  Genitourinary: Guaiac positive stool.  Rectal heme positive faint red in color. No external hemorrhoids no prolapsed internal hemorrhoids no fissure.  Musculoskeletal: Normal range of motion. She exhibits no edema.  No swelling in ankles  Neurological: She is alert and oriented to person, place, and time.  Skin: Skin is warm and dry.  Psychiatric: She has a normal mood and affect. Her behavior is normal.    ED Course  Procedures (including critical care time)  DIAGNOSTIC STUDIES: Oxygen Saturation is 98% on RA, normal by my interpretation.    COORDINATION OF CARE: 11:03 AM- Discussed plans to order diagnostic lab work and imaging. Pt advised of plan for treatment and pt agrees.  Medications  0.9 %  sodium chloride infusion ( Intravenous New Bag/Given 05/07/14 1151)  sodium chloride 0.9 % injection (not administered)  sodium chloride 0.9 % bolus 250 mL (0 mLs Intravenous Stopped 05/07/14 1150)  iohexol (OMNIPAQUE) 300 MG/ML solution 50 mL (50 mLs Oral Contrast Given 05/07/14 1213)  iohexol (OMNIPAQUE) 300 MG/ML solution 100 mL (100 mLs Intravenous Contrast Given 05/07/14 1213)     Results for orders placed during the hospital encounter of 05/07/14  CBC WITH DIFFERENTIAL      Result Value Ref Range   WBC 9.1  4.0 - 10.5 K/uL   RBC 4.96  3.87 - 5.11 MIL/uL   Hemoglobin 13.8  12.0 - 15.0 g/dL   HCT 41.7  36.0 - 46.0 %   MCV 84.1  78.0 - 100.0 fL   MCH 27.8  26.0 - 34.0 pg   MCHC 33.1  30.0 - 36.0 g/dL   RDW 13.9  11.5 - 15.5 %   Platelets 252  150 - 400 K/uL   Neutrophils Relative % 60  43 - 77 %   Neutro Abs 5.5  1.7 - 7.7 K/uL   Lymphocytes Relative 31  12 - 46 %   Lymphs Abs 2.8  0.7 - 4.0 K/uL   Monocytes Relative 7  3 - 12 %  Monocytes Absolute 0.6  0.1 - 1.0 K/uL   Eosinophils Relative 2  0 - 5 %   Eosinophils Absolute 0.2  0.0 - 0.7 K/uL   Basophils Relative 0  0 - 1 %   Basophils Absolute 0.0  0.0 - 0.1 K/uL  BASIC METABOLIC PANEL      Result Value Ref Range   Sodium 139  137 - 147 mEq/L   Potassium 3.8  3.7 - 5.3 mEq/L   Chloride 97  96 - 112 mEq/L   CO2 29  19 - 32 mEq/L   Glucose, Bld 139 (*) 70 - 99 mg/dL   BUN 12  6 - 23 mg/dL   Creatinine, Ser 0.81  0.50 - 1.10 mg/dL   Calcium 9.7  8.4 - 10.5 mg/dL   GFR calc non Af Amer 76 (*) >90 mL/min   GFR calc Af Amer 88 (*) >90 mL/min   Anion gap 13  5 - 15  PRO B NATRIURETIC PEPTIDE      Result Value Ref Range   Pro B Natriuretic peptide (BNP) 33.1  0 - 125 pg/mL  LIPASE, BLOOD      Result Value Ref Range   Lipase 18  11 - 59 U/L  HEPATIC FUNCTION PANEL      Result Value Ref Range   Total Protein 7.7  6.0 - 8.3 g/dL   Albumin 3.5  3.5 - 5.2 g/dL   AST 21  0 - 37 U/L   ALT 24  0 - 35 U/L   Alkaline Phosphatase 91  39 - 117 U/L   Total Bilirubin 0.4  0.3 - 1.2 mg/dL   Bilirubin, Direct <0.2  0.0 - 0.3 mg/dL   Indirect Bilirubin NOT CALCULATED  0.3 - 0.9 mg/dL  TYPE AND SCREEN      Result Value Ref Range   ABO/RH(D) A POS     Antibody Screen NEG     Sample Expiration 05/10/2014      Imaging Review Dg Chest 2 View  05/07/2014   CLINICAL DATA:  Four day history of cough, former  smoker  EXAM: CHEST  2 VIEW  COMPARISON:  Prior chest x-ray 02/03/2013; CT abdomen/ pelvis performed today 05/07/2014  FINDINGS: Stable cardiac and mediastinal contours which are within normal limits. The lungs are clear. No focal airspace consolidation, pleural effusion or pneumothorax. No suspicious pulmonary nodule or mass. No acute osseous abnormality.  IMPRESSION: No active cardiopulmonary disease.   Electronically Signed   By: Jacqulynn Cadet M.D.   On: 05/07/2014 12:20   Ct Abdomen Pelvis W Contrast  05/07/2014   CLINICAL DATA:  Abdominal pain, nausea  EXAM: CT ABDOMEN AND PELVIS WITH CONTRAST  TECHNIQUE: Multidetector CT imaging of the abdomen and pelvis was performed using the standard protocol following bolus administration of intravenous contrast.  CONTRAST:  35mL OMNIPAQUE IOHEXOL 300 MG/ML SOLN, 164mL OMNIPAQUE IOHEXOL 300 MG/ML SOLN  COMPARISON:  None.  FINDINGS: Lower Chest: The lung bases are clear. Visualized cardiac structures are within normal limits for size. No pericardial effusion. Unremarkable visualized distal thoracic esophagus.  Abdomen: Unremarkable CT appearance of the stomach, duodenum, spleen, adrenal glands and pancreas. Diffuse hypoattenuation of the hepatic parenchyma consistent with at least moderate steatosis. No discrete hepatic lesion. The gallbladder is surgically absent. No intra or extrahepatic biliary ductal dilatation. Unremarkable appearance of the bilateral kidneys. No focal solid lesion, hydronephrosis or nephrolithiasis. Subtle regions of focal cortical thinning in the superior aspect of the left kidney suggests areas  of cortical scarring, perhaps from prior infection.  No focal bowel wall thickening or evidence of obstruction. The appendix is not clearly identified however, there is no significant inflammatory change in the right lower quadrant to suggest appendicitis.  Pelvis: Surgical changes of prior cholecystectomy. The the bladder dome is patulous and extends  upward to be the left ureter consistent with surgical changes of prior psoas hitch procedure. The bilateral ovaries are unremarkable. No free fluid or suspicious adenopathy.  Bones/Soft Tissues: No acute fracture or aggressive appearing lytic or blastic osseous lesion.  Vascular: No significant atherosclerotic vascular disease, aneurysmal dilatation or acute abnormality.  IMPRESSION: 1. No acute abnormality in the abdomen or pelvis to explain the patient's clinical symptoms. 2. Surgical changes of prior cholecystectomy, hysterectomy and left psoas hitch ureteroneocystostomy procedure. 3. 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 4. The appendix is not clearly identified and may also be surgically absent. No inflammatory change of the right lower quadrant to suggest appendicitis.   Electronically Signed   By: Jacqulynn Cadet M.D.   On: 05/07/2014 12:44     EKG Interpretation None      MDM   Final diagnoses:  Rectal bleeding  Lower abdominal pain    Patient presents with lower abdominal pain and rectal bleeding since last night about 67 episodes of blood not staining water all red. Explanation for the lower quadrant abdominal pain not provided by the CT scan. On rectal exam patient is heme positive with some faint red blood not dark in color. No obvious explanation for the rectal bleeding. Due to the episodes and frequency of the bleeding the patient should be admitted. Patient is not tachycardic not hypotensive. Hemoglobin and hematocrit here is normal. No leukocytosis. CT scan does not show diverticulosis or diverticulitis. Patient primary care Dr. in the [place] area.  I personally performed the services described in this documentation, which was scribed in my presence. The recorded information has been reviewed and is accurate.       Fredia Sorrow, MD 05/07/14 [phone]

## 2014-05-07 NOTE — H&P (Signed)
History and Physical  Beth Phelps [facility] SAY:[id number] DOB: [birth year] DOA: 05/07/2014  Referring physician: Dr. Rogene Houston in ED PCP: Neale Burly, MD   Chief Complaint: Bleeding  HPI:  63 year old woman presented to the emergency department with acute onset of crampy abdominal pain and rectal bleeding beginning last night. Initial evaluation was unremarkable in the emergency department and she was referred for observation.  Patient reports a colonoscopy sometime in the past with polypectomy, uncertain how long ago. She has had no previous problems with bleeding but does have hemorrhoids. She was visiting family yesterday when on the return trip she developed crampy abdominal pain and diarrhea. She began bleeding last night with several episodes overnight. Seems to be letting up at this point. Abdominal pains associated with cramping and bowel movement and relieved with evacuation. She has had no nausea or vomiting.  In the emergency department afebrile, vital signs stable. No hypoxia. Complete metabolic panel unremarkable. CBC normal. CT of the abdomen and pelvis with no acute abnormalities.  Review of Systems:  Negative for fever, visual changes, sore throat, rash, new muscle aches, chest pain, SOB, dysuria  Past Medical History  Diagnosis Date  . History of sleep apnea   . Diabetes mellitus     type 2  . History of asthma   . GERD (gastroesophageal reflux disease)   . Chronic pain   . Hyperlipidemia   . Obesity   . Fatty liver   . Hiatal hernia 11/15/2004    small  . Migraine   . Herpes   . HTN (hypertension)     Past Surgical History  Procedure Laterality Date  . Abdominal hysterectomy    . Cholecystectomy    . Bladder surgery    . Mole removal      malignant  . Esophagogastroduodenoscopy  11/15/2004    Dr. Laural Golden- small sliding hiatal hernia with mild changes of reflux esophagitis    Social History:  reports that she has quit smoking. She does not have any smokeless  tobacco history on file. She reports that she does not drink alcohol or use illicit drugs.  No Known Allergies  Family History  Problem Relation Age of Onset  . Heart disease Neg Hx   . Diabetes Father      Prior to Admission medications   Medication Sig Start Date End Date Taking? Authorizing Provider  escitalopram (LEXAPRO) 20 MG tablet Take 20 mg by mouth daily.     Yes Historical Provider, MD  esomeprazole (NEXIUM) 40 MG capsule Take 40 mg by mouth daily before breakfast.     Yes Historical Provider, MD  hydrochlorothiazide 25 MG tablet Take 25 mg by mouth daily.     Yes Historical Provider, MD  metFORMIN (GLUCOPHAGE) 500 MG tablet Take 500 mg by mouth daily.     Yes Historical Provider, MD  metoprolol (LOPRESSOR) 50 MG tablet Take 100 mg by mouth daily.    Yes Historical Provider, MD   Physical Exam: Filed Vitals:   05/07/14 1230 05/07/14 1249 05/07/14 1439 05/07/14 1533  BP:  135/76 127/75 127/74  Pulse: 83 79 86 83  Temp:    98 F (36.7 C)  TempSrc:    Oral  Resp: 22 19 18 16   Height:      Weight:      SpO2: 99% 98% 97% 97%    General: Examined in the emergency department. Appears calm and comfortable Eyes: PERRL, normal lids, irises  ENT: grossly normal hearing, lips & tongue Neck: no  LAD, masses or thyromegaly Cardiovascular: RRR, no m/r/g. No LE edema. Respiratory: CTA bilaterally, no w/r/r. Normal respiratory effort. Abdomen: soft, nondistended. Mild lower abdominal pain. No masses appreciated Skin: no rash or induration seen  Musculoskeletal: grossly normal tone BUE/BLE Psychiatric: grossly normal mood and affect, speech fluent and appropriate Neurologic: grossly non-focal.  Wt Readings from Last 3 Encounters:  05/07/14 129.729 kg (286 lb)  08/24/10 124.739 kg (275 lb)    Labs on Admission:  Basic Metabolic Panel:  Recent Labs Lab 05/07/14 1125  NA 139  K 3.8  CL 97  CO2 29  GLUCOSE 139*  BUN 12  CREATININE 0.81  CALCIUM 9.7    Liver  Function Tests:  Recent Labs Lab 05/07/14 1125  AST 21  ALT 24  ALKPHOS 91  BILITOT 0.4  PROT 7.7  ALBUMIN 3.5    Recent Labs Lab 05/07/14 1125  LIPASE 18    CBC:  Recent Labs Lab 05/07/14 1125  WBC 9.1  NEUTROABS 5.5  HGB 13.8  HCT 41.7  MCV 84.1  PLT 252      Recent Labs  05/07/14 1125  PROBNP 33.1     Radiological Exams on Admission: Dg Chest 2 View  05/07/2014   CLINICAL DATA:  Four day history of cough, former smoker  EXAM: CHEST  2 VIEW  COMPARISON:  Prior chest x-ray 02/03/2013; CT abdomen/ pelvis performed today 05/07/2014  FINDINGS: Stable cardiac and mediastinal contours which are within normal limits. The lungs are clear. No focal airspace consolidation, pleural effusion or pneumothorax. No suspicious pulmonary nodule or mass. No acute osseous abnormality.  IMPRESSION: No active cardiopulmonary disease.   Electronically Signed   By: Jacqulynn Cadet M.D.   On: 05/07/2014 12:20   Ct Abdomen Pelvis W Contrast  05/07/2014   CLINICAL DATA:  Abdominal pain, nausea  EXAM: CT ABDOMEN AND PELVIS WITH CONTRAST  TECHNIQUE: Multidetector CT imaging of the abdomen and pelvis was performed using the standard protocol following bolus administration of intravenous contrast.  CONTRAST:  57mL OMNIPAQUE IOHEXOL 300 MG/ML SOLN, 193mL OMNIPAQUE IOHEXOL 300 MG/ML SOLN  COMPARISON:  None.  FINDINGS: Lower Chest: The lung bases are clear. Visualized cardiac structures are within normal limits for size. No pericardial effusion. Unremarkable visualized distal thoracic esophagus.  Abdomen: Unremarkable CT appearance of the stomach, duodenum, spleen, adrenal glands and pancreas. Diffuse hypoattenuation of the hepatic parenchyma consistent with at least moderate steatosis. No discrete hepatic lesion. The gallbladder is surgically absent. No intra or extrahepatic biliary ductal dilatation. Unremarkable appearance of the bilateral kidneys. No focal solid lesion, hydronephrosis or  nephrolithiasis. Subtle regions of focal cortical thinning in the superior aspect of the left kidney suggests areas of cortical scarring, perhaps from prior infection.  No focal bowel wall thickening or evidence of obstruction. The appendix is not clearly identified however, there is no significant inflammatory change in the right lower quadrant to suggest appendicitis.  Pelvis: Surgical changes of prior cholecystectomy. The the bladder dome is patulous and extends upward to be the left ureter consistent with surgical changes of prior psoas hitch procedure. The bilateral ovaries are unremarkable. No free fluid or suspicious adenopathy.  Bones/Soft Tissues: No acute fracture or aggressive appearing lytic or blastic osseous lesion.  Vascular: No significant atherosclerotic vascular disease, aneurysmal dilatation or acute abnormality.  IMPRESSION: 1. No acute abnormality in the abdomen or pelvis to explain the patient's clinical symptoms. 2. Surgical changes of prior cholecystectomy, hysterectomy and left psoas hitch ureteroneocystostomy procedure. 3. 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 4. The appendix is not clearly identified and may also be surgically absent. No inflammatory change of the right lower quadrant to suggest appendicitis.   Electronically Signed   By: Jacqulynn Cadet M.D.   On: 05/07/2014 12:44      Active Problems:   GI bleed   Assessment/Plan 1. Rectal bleeding with abdominal cramping, suspect lower GI bleed. Differential includes hemorrhoidal or diverticular. No evidence of hemorrhage at this point. Initial hemoglobin within normal limits. 2. Diabetes mellitus type 2.   Appears stable for admission to medical floor for overnight observation. Plan serial CBC. Monitor for worsening of bleeding. GI evaluation in the morning.  Sliding-scale insulin.  Code Status: full code  DVT prophylaxis: SCDs Family  Communication: none present Disposition Plan/Anticipated LOS: obs, 24 hours  Time spent: 50 minutes  Murray Hodgkins, MD  [facility] Pager [phone] 05/07/2014, 4:34 PM

## 2014-05-08 ENCOUNTER — Encounter (HOSPITAL_COMMUNITY): Payer: Self-pay | Admitting: Gastroenterology

## 2014-05-08 DIAGNOSIS — R197 Diarrhea, unspecified: Secondary | ICD-10-CM

## 2014-05-08 DIAGNOSIS — E119 Type 2 diabetes mellitus without complications: Secondary | ICD-10-CM

## 2014-05-08 DIAGNOSIS — K625 Hemorrhage of anus and rectum: Secondary | ICD-10-CM

## 2014-05-08 DIAGNOSIS — R109 Unspecified abdominal pain: Secondary | ICD-10-CM

## 2014-05-08 LAB — URINE MICROSCOPIC-ADD ON

## 2014-05-08 LAB — GLUCOSE, CAPILLARY
Glucose-Capillary: 137 mg/dL — ABNORMAL HIGH (ref 70–99)
Glucose-Capillary: 110 mg/dL — ABNORMAL HIGH (ref 70–99)
Glucose-Capillary: 107 mg/dL — ABNORMAL HIGH (ref 70–99)
Glucose-Capillary: 164 mg/dL — ABNORMAL HIGH (ref 70–99)

## 2014-05-08 LAB — URINALYSIS, ROUTINE W REFLEX MICROSCOPIC
Bilirubin Urine: NEGATIVE
Glucose, UA: NEGATIVE mg/dL
Hgb urine dipstick: NEGATIVE
Ketones, ur: NEGATIVE mg/dL
Nitrite: POSITIVE — AB
Protein, ur: NEGATIVE mg/dL
Specific Gravity, Urine: 1.01 (ref 1.005–1.030)
Urobilinogen, UA: 0.2 mg/dL (ref 0.0–1.0)
pH: 6.5 (ref 5.0–8.0)

## 2014-05-08 LAB — BASIC METABOLIC PANEL
Anion gap: 11 (ref 5–15)
BUN: 10 mg/dL (ref 6–23)
CO2: 29 mEq/L (ref 19–32)
Calcium: 8.7 mg/dL (ref 8.4–10.5)
Chloride: 102 mEq/L (ref 96–112)
Creatinine, Ser: 0.84 mg/dL (ref 0.50–1.10)
GFR calc Af Amer: 84 mL/min — ABNORMAL LOW (ref >90)
GFR calc non Af Amer: 72 mL/min — ABNORMAL LOW (ref >90)
Glucose, Bld: 119 mg/dL — ABNORMAL HIGH (ref 70–99)
Potassium: 3.6 mEq/L — ABNORMAL LOW (ref 3.7–5.3)
Sodium: 142 mEq/L (ref 137–147)

## 2014-05-08 LAB — OCCULT BLOOD, POC DEVICE: Fecal Occult Bld: POSITIVE — AB

## 2014-05-08 LAB — CBC
HCT: 39 % (ref 36.0–46.0)
Hemoglobin: 12.9 g/dL (ref 12.0–15.0)
MCH: 27.9 pg (ref 26.0–34.0)
MCHC: 33.1 g/dL (ref 30.0–36.0)
MCV: 84.4 fL (ref 78.0–100.0)
Platelets: 212 10*3/uL (ref 150–400)
RBC: 4.62 MIL/uL (ref 3.87–5.11)
RDW: 14.2 % (ref 11.5–15.5)
WBC: 7.8 10*3/uL (ref 4.0–10.5)

## 2014-05-08 MED ORDER — CIPROFLOXACIN HCL 250 MG PO TABS
250.0000 mg | ORAL_TABLET | Freq: Two times a day (BID) | ORAL | Status: DC
  Administered 2014-05-08 – 2014-05-09 (×2): 250 mg via ORAL
  Filled 2014-05-08 (×2): qty 1

## 2014-05-08 NOTE — Consult Note (Signed)
Referring Provider: Samuella Cota, MD Primary Care Physician:  Neale Burly, MD Primary Gastroenterologist:  Garfield Cornea, MD  Reason for Consultation:  Rectal bleeding  HPI: Beth Phelps is a 63 y.o. female who presented to the emergency department with acute onset crampy abdominal pain associated with diarrhea and rectal bleeding beginning evening before admission. We were asked to see the patient at the request of Dr. Sarajane Jews. Remotely seen by Dr. Laural Golden but request seeing Dr Gala Romney.   In the ER, CT performed with no acute findings. Patient reports previous colonoscopies with colon polyps. Last TCS ?one year ago, Dr. Anthony Sar. I have requested records this morning. Constipation worse for couple of months. Never been regular. Prior to this occasional brbpr on toilet tissue. Current episode noted blood after several BMs. Two BMs in the ER. Less blood. No heartburn, dysphagia, vomiting. Some nausea. Was taken ASA per day but none in 3 days or so. UTI 3 weeks ago, Macrodantin. C/o dysuria that started overnight. Denies ill contacts, travel abroad.    Prior to Admission medications   Medication Sig Start Date End Date Taking? Authorizing Provider  escitalopram (LEXAPRO) 20 MG tablet Take 20 mg by mouth daily.     Yes Historical Provider, MD  esomeprazole (NEXIUM) 40 MG capsule Take 40 mg by mouth daily before breakfast.     Yes Historical Provider, MD  hydrochlorothiazide 25 MG tablet Take 25 mg by mouth daily.     Yes Historical Provider, MD  metFORMIN (GLUCOPHAGE) 500 MG tablet Take 500 mg by mouth daily.     Yes Historical Provider, MD  metoprolol (LOPRESSOR) 50 MG tablet Take 100 mg by mouth daily.    Yes Historical Provider, MD    Current Facility-Administered Medications  Medication Dose Route Frequency Provider Last Rate Last Dose  . 0.9 %  sodium chloride infusion   Intravenous Continuous Fredia Sorrow, MD 75 mL/hr at 05/07/14 2102    . acetaminophen (TYLENOL) tablet 650 mg   650 mg Oral Q6H PRN Samuella Cota, MD   650 mg at 05/08/14 4970   Or  . acetaminophen (TYLENOL) suppository 650 mg  650 mg Rectal Q6H PRN Samuella Cota, MD      . alum & mag hydroxide-simeth (MAALOX/MYLANTA) 200-200-20 MG/5ML suspension 15 mL  15 mL Oral Q4H PRN Samuella Cota, MD   15 mL at 05/08/14 0729  . escitalopram (LEXAPRO) tablet 20 mg  20 mg Oral Daily Samuella Cota, MD      . hydrochlorothiazide (HYDRODIURIL) tablet 25 mg  25 mg Oral Daily Samuella Cota, MD      . insulin aspart (novoLOG) injection 0-9 Units  0-9 Units Subcutaneous TID WC Samuella Cota, MD      . metoprolol (LOPRESSOR) tablet 100 mg  100 mg Oral Daily Samuella Cota, MD      . ondansetron [facility]) tablet 4 mg  4 mg Oral Q6H PRN Samuella Cota, MD       Or  . ondansetron [facility]) injection 4 mg  4 mg Intravenous Q6H PRN Samuella Cota, MD      . pantoprazole (PROTONIX) EC tablet 40 mg  40 mg Oral Daily Samuella Cota, MD   40 mg at 05/07/14 1737    Allergies as of 05/07/2014  . (No Known Allergies)    Past Medical History  Diagnosis Date  . History of sleep apnea   . Diabetes mellitus     type 2  .  History of asthma   . GERD (gastroesophageal reflux disease)   . Chronic pain   . Hyperlipidemia   . Obesity   . Fatty liver   . Hiatal hernia 11/15/2004    small  . Migraine   . Herpes   . HTN (hypertension)     Past Surgical History  Procedure Laterality Date  . Abdominal hysterectomy    . Cholecystectomy    . Bladder surgery    . Mole removal      malignant  . Esophagogastroduodenoscopy  11/15/2004    Dr. Laural Golden- small sliding hiatal hernia with mild changes of reflux esophagitis    Family History  Problem Relation Age of Onset  . Heart disease Neg Hx   . Diabetes Father   . Colon cancer Neg Hx     History   Social History  . Marital Status: Divorced    Spouse Name: N/A    Number of Children: N/A  . Years of Education: N/A   Occupational History  .  Not on file.   Social History Main Topics  . Smoking status: Former Research scientist (life sciences)  . Smokeless tobacco: Never Used  . Alcohol Use: No  . Drug Use: No  . Sexual Activity: No   Other Topics Concern  . Not on file   Social History Narrative   Divorced     ROS:  General: Negative for anorexia, weight loss, fever, chills, fatigue, weakness. Eyes: Negative for vision changes.  ENT: Negative for hoarseness, difficulty swallowing , nasal congestion. CV: Negative for chest pain, angina, palpitations, dyspnea on exertion, peripheral edema.  Respiratory: Negative for dyspnea at rest, dyspnea on exertion, cough, sputum, wheezing.  GI: See history of present illness. GU:  Negative for hematuria, urinary incontinence. +dysuria urinary frequency, nocturnal urination.  MS: Negative for joint pain, low back pain.  Derm: Negative for rash or itching.  Neuro: Negative for weakness, abnormal sensation, seizure, frequent headaches, memory loss, confusion.  Psych: Negative for anxiety, depression, suicidal ideation, hallucinations.  Endo: Negative for unusual weight change.  Heme: Negative for bruising or bleeding. Allergy: Negative for rash or hives.       Physical Examination: Vital signs in last 24 hours: Temp:  [97.4 F (36.3 C)-98.4 F (36.9 C)] 97.4 F (36.3 C) (07/23 0727) Pulse Rate:  [75-93] 87 (07/23 0727) Resp:  [16-22] 16 (07/22 1533) BP: (127-155)/(68-116) 155/68 mmHg (07/23 0727) SpO2:  [95 %-99 %] 95 % (07/23 0727) Weight:  [276 lb 3.8 oz (125.3 kg)-286 lb (129.729 kg)] 276 lb 3.8 oz (125.3 kg) (07/22 1647) Last BM Date: 05/07/14  General: Well-nourished, well-developed in no acute distress. Morbidly obese. Head: Normocephalic, atraumatic.   Eyes: Conjunctiva pink, no icterus. Mouth: Oropharyngeal mucosa moist and pink , no lesions erythema or exudate. Neck: Supple without thyromegaly, masses, or lymphadenopathy.  Lungs: Clear to auscultation bilaterally.  Heart: Regular rate  and rhythm, no murmurs rubs or gallops.  Abdomen: Bowel sounds are normal, nontender, nondistended, no hepatosplenomegaly or masses, no abdominal bruits or    hernia , no rebound or guarding.  Limited exam due to body habitus. Rectal: done in ER Extremities: No lower extremity edema, clubbing, deformity.  Neuro: Alert and oriented x 4 , grossly normal neurologically.  Skin: Warm and dry, no rash or jaundice.   Psych: Alert and cooperative, normal mood and affect.        Intake/Output from previous day: 07/22 0701 - 07/23 0700 In: 1578.8 [P.O.:240; I.V.:1338.8] Out: 400 [Urine:400] Intake/Output this shift:  Lab Results: CBC  Recent Labs  05/07/14 1125 05/07/14 1700 05/08/14 0537  WBC 9.1 9.4 7.8  HGB 13.8 12.8 12.9  HCT 41.7 38.9 39.0  MCV 84.1 84.6 84.4  PLT 252 242 212   BMET  Recent Labs  05/07/14 1125 05/08/14 0537  NA 139 142  K 3.8 3.6*  CL 97 102  CO2 29 29  GLUCOSE 139* 119*  BUN 12 10  CREATININE 0.81 0.84  CALCIUM 9.7 8.7   LFT  Recent Labs  05/07/14 1125  BILITOT 0.4  BILIDIR <0.2  IBILI NOT CALCULATED  ALKPHOS 91  AST 21  ALT 24  PROT 7.7  ALBUMIN 3.5    Lipase  Recent Labs  05/07/14 1125  LIPASE 18    PT/INR No results found for this basename: LABPROT, INR,  in the last 72 hours    Imaging Studies: Dg Chest 2 View  05/07/2014   CLINICAL DATA:  Four day history of cough, former smoker  EXAM: CHEST  2 VIEW  COMPARISON:  Prior chest x-ray 02/03/2013; CT abdomen/ pelvis performed today 05/07/2014  FINDINGS: Stable cardiac and mediastinal contours which are within normal limits. The lungs are clear. No focal airspace consolidation, pleural effusion or pneumothorax. No suspicious pulmonary nodule or mass. No acute osseous abnormality.  IMPRESSION: No active cardiopulmonary disease.   Electronically Signed   By: Jacqulynn Cadet M.D.   On: 05/07/2014 12:20   Ct Abdomen Pelvis W Contrast  05/07/2014   CLINICAL DATA:  Abdominal pain,  nausea  EXAM: CT ABDOMEN AND PELVIS WITH CONTRAST  TECHNIQUE: Multidetector CT imaging of the abdomen and pelvis was performed using the standard protocol following bolus administration of intravenous contrast.  CONTRAST:  40mL OMNIPAQUE IOHEXOL 300 MG/ML SOLN, 1104mL OMNIPAQUE IOHEXOL 300 MG/ML SOLN  COMPARISON:  None.  FINDINGS: Lower Chest: The lung bases are clear. Visualized cardiac structures are within normal limits for size. No pericardial effusion. Unremarkable visualized distal thoracic esophagus.  Abdomen: Unremarkable CT appearance of the stomach, duodenum, spleen, adrenal glands and pancreas. Diffuse hypoattenuation of the hepatic parenchyma consistent with at least moderate steatosis. No discrete hepatic lesion. The gallbladder is surgically absent. No intra or extrahepatic biliary ductal dilatation. Unremarkable appearance of the bilateral kidneys. No focal solid lesion, hydronephrosis or nephrolithiasis. Subtle regions of focal cortical thinning in the superior aspect of the left kidney suggests areas of cortical scarring, perhaps from prior infection.  No focal bowel wall thickening or evidence of obstruction. The appendix is not clearly identified however, there is no significant inflammatory change in the right lower quadrant to suggest appendicitis.  Pelvis: Surgical changes of prior cholecystectomy. The the bladder dome is patulous and extends upward to be the left ureter consistent with surgical changes of prior psoas hitch procedure. The bilateral ovaries are unremarkable. No free fluid or suspicious adenopathy.  Bones/Soft Tissues: No acute fracture or aggressive appearing lytic or blastic osseous lesion.  Vascular: No significant atherosclerotic vascular disease, aneurysmal dilatation or acute abnormality.  IMPRESSION: 1. No acute abnormality in the abdomen or pelvis to explain the patient's clinical symptoms. 2. Surgical changes of prior cholecystectomy, hysterectomy and left psoas hitch  ureteroneocystostomy procedure. 3. 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 4. The appendix is not clearly identified and may also be surgically absent. No inflammatory change of the right lower quadrant to suggest appendicitis.   Electronically Signed   By: Jacqulynn Cadet M.D.   On:  05/07/2014 12:44  [4 week]   Impression: 63 year old lady presented with acute onset abdominal cramping associated with loose stools and fresh blood per him. Hemoglobin has remained normal. She's had no further bowel movements since admission. She believes her last colonoscopy was about one year ago. Clinically he would consider gastroenteritis plus or minus benign anorectal bleeding, cannot exclude mild ischemic colitis, unlikely IBD or malignancy.  Plan: 1. Review records of last colonoscopy. Keep n.p.o. until decision made regarding possible need for colonoscopy this admission. 2. Stool studies. 3. Further recommendations to follow.   We would like to thank you for the opportunity to participate in the care of [place].   LOS: 1 day   Neil Crouch  05/08/2014, 7:40 AM  Addendum Colonoscopy, December 2013, Dr. Anthony Sar.  Indication: Personal history of polyps.  Impression: Normal colonoscopy.  Discussed with Dr. Gala Romney. Patient likely with episode of mild ischemic colitis. Would recommend supportive measures. Consider outpatient colonoscopy.   Attending note:  Patient seen and examined this afternoon. As outlined above. Patient is having 1 formed nonbloody stool this afternoon. More likely acute illness related to mild ischemic colitis, less likely infection. She appears to be improving spontaneously.  She will be reassessed in the morning

## 2014-05-08 NOTE — Progress Notes (Signed)
  PROGRESS NOTE  [facility] ZPH:[id number] DOB: 1951-01-18 DOA: 05/07/2014 PCP: Neale Burly, MD  Summary: 63 year old woman presented with acute onset of crampy abdominal pain and rectal bleeding less than 24 hours prior to admission. Initial evaluation was unremarkable.  Assessment/Plan: 1. Rectal bleeding with abdominal cramping. Significance unclear. Hemoglobin remained stable. No further bleeding since admission. Possibly hemorrhoids.-Enteritis also considered but appears resolved. 2. Dysuria. Stable. 3. Diabetes mellitus type 2.   Further recommendations per GI. Anticipate discharge when cleared by GI. Check urinalysis.  Check urinalysis, culture.  Code Status: Full code DVT prophylaxis: SCDs Family Communication: None present Disposition Plan: Home  Murray Hodgkins, MD  [facility]  Pager [phone] If 7PM-7AM, please contact night-coverage at [email or web address], password [facility] 05/08/2014, 9:26 AM  LOS: 1 day   Consultants:  Gastroenterology  Procedures:    Antibiotics:    HPI/Subjective: Gastroenterology considering gastroenteritis, possible benign anorectal bleeding. Records requested.  No bowel movement or bleeding since admission. No nausea or vomiting. No abdominal pain. Reports dysuria, burning.  Objective: Filed Vitals:   05/07/14 1533 05/07/14 1647 05/07/14 2106 05/08/14 0727  BP: 127/74 144/88 140/78 155/68  Pulse: 83 79 75 87  Temp: 98 F (36.7 C) 98.4 F (36.9 C) 98.1 F (36.7 C) 97.4 F (36.3 C)  TempSrc: Oral Oral Oral Oral  Resp: 16     Height:  5\' 3"  (1.6 m)    Weight:  125.3 kg (276 lb 3.8 oz)    SpO2: 97% 97% 97% 95%    Intake/Output Summary (Last 24 hours) at 05/08/14 0926 Last data filed at 05/08/14 0542  Gross per 24 hour  Intake 1578.75 ml  Output    400 ml  Net 1178.75 ml     Filed Weights   05/07/14 1026 05/07/14 1647  Weight: 129.729 kg (286 lb) 125.3 kg (276 lb 3.8 oz)    Exam:     Afebrile, vital signs  stable. No hypoxia. Gen. Appears calm and comfortable. Speech fluent and clear.  Psych. Grossly normal mentation.  Cardiovascular. Regular rate and rhythm. No murmur, rub or gallop.  Respiratory. Clear to auscultation bilaterally.  Abdomen. Soft, nontender, nondistended.  Data Reviewed:  Chemistry: Basic metabolic panel unremarkable.  Heme: CBC remains normal.  Scheduled Meds: . escitalopram  20 mg Oral Daily  . hydrochlorothiazide  25 mg Oral Daily  . insulin aspart  0-9 Units Subcutaneous TID WC  . metoprolol  100 mg Oral Daily  . pantoprazole  40 mg Oral Daily   Continuous Infusions: . sodium chloride 75 mL/hr at 05/07/14 2102    Principal Problem:   GI bleed Active Problems:   DM   Rectal bleeding   Time spent 20 minutes

## 2014-05-08 NOTE — Progress Notes (Signed)
Late entry for 0730. Pt c/o left side chest pain that was deep, muscular, & sharp in nature. Pt states that it feels like gas pain. Pt has Maalox available. Maalox and Tylenol were given. Pain was reassessed and pt had no relief. Pt states that she has had pulled muscles in the past and that it felt similar to this. Heat pack was given, pain was reassessed within the hour and pt denies pain at this time. Marry Guan

## 2014-05-09 ENCOUNTER — Telehealth: Payer: Self-pay | Admitting: Gastroenterology

## 2014-05-09 DIAGNOSIS — N3000 Acute cystitis without hematuria: Secondary | ICD-10-CM

## 2014-05-09 DIAGNOSIS — N390 Urinary tract infection, site not specified: Secondary | ICD-10-CM

## 2014-05-09 DIAGNOSIS — K559 Vascular disorder of intestine, unspecified: Secondary | ICD-10-CM

## 2014-05-09 LAB — CBC
HCT: 41.8 % (ref 36.0–46.0)
Hemoglobin: 14.1 g/dL (ref 12.0–15.0)
MCH: 28.7 pg (ref 26.0–34.0)
MCHC: 33.7 g/dL (ref 30.0–36.0)
MCV: 85 fL (ref 78.0–100.0)
Platelets: 244 10*3/uL (ref 150–400)
RBC: 4.92 MIL/uL (ref 3.87–5.11)
RDW: 13.9 % (ref 11.5–15.5)
WBC: 8 10*3/uL (ref 4.0–10.5)

## 2014-05-09 LAB — GLUCOSE, CAPILLARY
Glucose-Capillary: 138 mg/dL — ABNORMAL HIGH (ref 70–99)
Glucose-Capillary: 151 mg/dL — ABNORMAL HIGH (ref 70–99)

## 2014-05-09 MED ORDER — CIPROFLOXACIN HCL 250 MG PO TABS: 250.0000 mg | ORAL_TABLET | Freq: Two times a day (BID) | ORAL | Status: DC

## 2014-05-09 NOTE — Plan of Care (Signed)
Problem: Food- and Nutrition-Related Knowledge Deficit (NB-1.1) Goal: Nutrition education Formal process to instruct or train a patient/client in a skill or to impart knowledge to help patients/clients voluntarily manage or modify food choices and eating behavior to maintain or improve health. Outcome: Adequate for Discharge  RD consulted for nutrition education regarding diabetes.     No results found for this basename: HGBA1C    RD provided "Sample Meal Planning", " [place]", " Eating Healthy on a Lean Budget" handouts. Pt says she was able to lose weight and improve her DM management in the past when she was tracking how much she ate daily. Provided her with recommendations of 3 credible on-line sources that will allow her to journal her daily intake and give her feedback as too calorie intake and macronutrient breakdown. Discussed importance of controlled and consistent carbohydrate intake throughout the day. Provided examples of ways to balance meals/snacks and encouraged intake of high-fiber, whole grain complex carbohydrates. Teach back method used.  Expect good compliance.  Body mass index is 48.95 kg/(m^2). Pt meets criteria for obesity class III  based on current BMI.  Current diet order is CHO Modified diet and patient is consuming approximately 100% of meals at this time. Labs and medications reviewed. No further nutrition interventions warranted at this time. RD contact information provided. If additional nutrition issues arise, please re-consult RD.  Colman Cater MS,RD,CSG,LDN Office: [phone] Pager: [phone]

## 2014-05-09 NOTE — Discharge Summary (Signed)
Physician Discharge Summary  Beth Phelps [facility] HQI:[id number] DOB: [birth year] DOA: 05/07/2014  PCP: Beth Burly, MD  Admit date: 05/07/2014 Discharge date: 05/09/2014  Recommendations for Outpatient Follow-up:  1. Suspected ischemic colitis, outpatient colonoscopy. 2. Resolution of UTI. Culture pending.   Follow-up Information   Schedule an appointment as soon as possible for a visit with [facility] A, MD.   Specialty:  Internal Medicine   Contact information:   [address] [place] [address] 010 [phone]       Follow up with Beth Rudd, MD. (office will contact you for appointment)    Specialty:  Gastroenterology   Contact information:   [place] [address] [place] [address] [phone]      Discharge Diagnoses:  1. Rectal bleeding, suspected mild ischemic colitis. 2. UTI. 3. Diabetes mellitus type 2.  Discharge Condition: Improved. Disposition: Home.  Diet recommendation: Diabetic diet.  Filed Weights   05/07/14 1026 05/07/14 1647  Weight: 129.729 kg (286 lb) 125.3 kg (276 lb 3.8 oz)    History of present illness:  63 year old woman presented with acute onset of crampy abdominal pain and rectal bleeding less than 24 hours prior to admission. Initial evaluation was unremarkable.  Hospital Course:  Ms. Beth Phelps was admitted for further evaluation of rectal bleeding. She had no further episodes during her hospitalization her hemoglobin did remain stable. She required no blood products. She was seen by gastroenterology. No procedures were pursued. Clinical suspicion for mild ischemic colitis. GI recommended outpatient followup with colonoscopy. Incidental finding of UTI which will be treated with oral antibiotics.  1. Rectal bleeding with abdominal cramping. S resolved. GI suspect mild ischemic colitis. Hemoglobin remained stable. No further bleeding since admission.  2. UTI. Empiric Cipro. Followup culture. 3. Diabetes mellitus  type 2. Stable.  Consultants:  Gastroenterology Procedures: none Antibiotics:  Ciprofloxacin 7/23 >> 7/25  Discharge Instructions  Discharge Instructions   Activity as tolerated - No restrictions    Complete by:  As directed      Diet Carb Modified    Complete by:  As directed      Discharge instructions    Complete by:  As directed   Call your physician or seek immediate medical attention for recurrent bleeding, pain or worsening of condition.            Medication List         ciprofloxacin 250 MG tablet  Commonly known as:  CIPRO  Take 1 tablet (250 mg total) by mouth 2 (two) times daily.     escitalopram 20 MG tablet  Commonly known as:  LEXAPRO  Take 20 mg by mouth daily.     esomeprazole 40 MG capsule  Commonly known as:  NEXIUM  Take 40 mg by mouth daily before breakfast.     hydrochlorothiazide 25 MG tablet  Commonly known as:  HYDRODIURIL  Take 25 mg by mouth daily.     metFORMIN 500 MG tablet  Commonly known as:  GLUCOPHAGE  Take 500 mg by mouth daily.     metoprolol 50 MG tablet  Commonly known as:  LOPRESSOR  Take 100 mg by mouth daily.       No Known Allergies  The results of significant diagnostics from this hospitalization (including imaging, microbiology, ancillary and laboratory) are listed below for reference.    Significant Diagnostic Studies: Dg Chest 2 View  05/07/2014   CLINICAL DATA:  Four day history of cough, former smoker  EXAM: CHEST  2 VIEW  COMPARISON:  Prior chest x-ray 02/03/2013; CT abdomen/ pelvis performed today 05/07/2014  FINDINGS: Stable cardiac and mediastinal contours which are within normal limits. The lungs are clear. No focal airspace consolidation, pleural effusion or pneumothorax. No suspicious pulmonary nodule or mass. No acute osseous abnormality.  IMPRESSION: No active cardiopulmonary disease.   Electronically Signed   By: Jacqulynn Cadet M.D.   On: 05/07/2014 12:20   Ct Abdomen Pelvis W Contrast  05/07/2014    CLINICAL DATA:  Abdominal pain, nausea  EXAM: CT ABDOMEN AND PELVIS WITH CONTRAST  TECHNIQUE: Multidetector CT imaging of the abdomen and pelvis was performed using the standard protocol following bolus administration of intravenous contrast.  CONTRAST:  77mL OMNIPAQUE IOHEXOL 300 MG/ML SOLN, 150mL OMNIPAQUE IOHEXOL 300 MG/ML SOLN  COMPARISON:  None.  FINDINGS: Lower Chest: The lung bases are clear. Visualized cardiac structures are within normal limits for size. No pericardial effusion. Unremarkable visualized distal thoracic esophagus.  Abdomen: Unremarkable CT appearance of the stomach, duodenum, spleen, adrenal glands and pancreas. Diffuse hypoattenuation of the hepatic parenchyma consistent with at least moderate steatosis. No discrete hepatic lesion. The gallbladder is surgically absent. No intra or extrahepatic biliary ductal dilatation. Unremarkable appearance of the bilateral kidneys. No focal solid lesion, hydronephrosis or nephrolithiasis. Subtle regions of focal cortical thinning in the superior aspect of the left kidney suggests areas of cortical scarring, perhaps from prior infection.  No focal bowel wall thickening or evidence of obstruction. The appendix is not clearly identified however, there is no significant inflammatory change in the right lower quadrant to suggest appendicitis.  Pelvis: Surgical changes of prior cholecystectomy. The the bladder dome is patulous and extends upward to be the left ureter consistent with surgical changes of prior psoas hitch procedure. The bilateral ovaries are unremarkable. No free fluid or suspicious adenopathy.  Bones/Soft Tissues: No acute fracture or aggressive appearing lytic or blastic osseous lesion.  Vascular: No significant atherosclerotic vascular disease, aneurysmal dilatation or acute abnormality.  IMPRESSION: 1. No acute abnormality in the abdomen or pelvis to explain the patient's clinical symptoms. 2. Surgical changes of prior cholecystectomy,  hysterectomy and left psoas hitch ureteroneocystostomy procedure. 3. 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 4. The appendix is not clearly identified and may also be surgically absent. No inflammatory change of the right lower quadrant to suggest appendicitis.   Electronically Signed   By: Jacqulynn Cadet M.D.   On: 05/07/2014 12:44    Microbiology: Recent Results (from the past 240 hour(s))  MRSA PCR SCREENING     Status: None   Collection Time    05/07/14  5:40 PM      Result Value Ref Range Status   MRSA by PCR NEGATIVE  NEGATIVE Final   Comment:            The GeneXpert MRSA Assay (FDA     approved for NASAL specimens     only), is one component of a     comprehensive MRSA colonization     surveillance program. It is not     intended to diagnose MRSA     infection nor to guide or     monitor treatment for     MRSA infections.     Labs: Basic Metabolic Panel:  Recent Labs Lab 05/07/14 1125 05/08/14 0537  NA 139 142  K 3.8 3.6*  CL 97 102  CO2 29 29  GLUCOSE  139* 119*  BUN 12 10  CREATININE 0.81 0.84  CALCIUM 9.7 8.7   Liver Function Tests:  Recent Labs Lab 05/07/14 1125  AST 21  ALT 24  ALKPHOS 91  BILITOT 0.4  PROT 7.7  ALBUMIN 3.5    Recent Labs Lab 05/07/14 1125  LIPASE 18   CBC:  Recent Labs Lab 05/07/14 1125 05/07/14 1700 05/08/14 0537 05/09/14 0850  WBC 9.1 9.4 7.8 8.0  NEUTROABS 5.5  --   --   --   HGB 13.8 12.8 12.9 14.1  HCT 41.7 38.9 39.0 41.8  MCV 84.1 84.6 84.4 85.0  PLT 252 242 212 244     Recent Labs  05/07/14 1125  PROBNP 33.1   CBG:  Recent Labs Lab 05/08/14 1146 05/08/14 1714 05/08/14 2050 05/09/14 0718 05/09/14 1131  GLUCAP 110* 107* 164* 138* 151*    Principal Problem:   GI bleed Active Problems:   DM   Rectal bleeding   Time coordinating discharge: 25 minutes  Signed:  Murray Hodgkins, MD [facility] 05/09/2014, 11:59 AM

## 2014-05-09 NOTE — Progress Notes (Signed)
Subjective:  No further bleeding. Wants to go home. No abd pain, vomiting.  Objective: Vital signs in last 24 hours: Temp:  [97.6 F (36.4 C)-98.1 F (36.7 C)] 97.7 F (36.5 C) (07/24 0655) Pulse Rate:  [62-90] 62 (07/24 0655) Resp:  [16-18] 18 (07/24 0655) BP: (136-149)/(69-88) 149/69 mmHg (07/24 0655) SpO2:  [96 %-98 %] 96 % (07/24 0655) Last BM Date: 05/07/14 General:   Alert,  Well-developed, well-nourished, pleasant and cooperative in NAD Head:  Normocephalic and atraumatic. Eyes:  Sclera clear, no icterus.  Abdomen:  Soft, nontender and nondistended.  Normal bowel sounds, without guarding, and without rebound.   Extremities:  Without clubbing, deformity or edema. Neurologic:  Alert and  oriented x4;  grossly normal neurologically. Skin:  Intact without significant lesions or rashes. Psych:  Alert and cooperative. Normal mood and affect.  Intake/Output from previous day: 07/23 0701 - 07/24 0700 In: 2476.3 [P.O.:720; I.V.:1756.3] Out: 1700 [Urine:1700] Intake/Output this shift:    Lab Results: CBC  Recent Labs  05/07/14 1125 05/07/14 1700 05/08/14 0537  WBC 9.1 9.4 7.8  HGB 13.8 12.8 12.9  HCT 41.7 38.9 39.0  MCV 84.1 84.6 84.4  PLT 252 242 212   BMET  Recent Labs  05/07/14 1125 05/08/14 0537  NA 139 142  K 3.8 3.6*  CL 97 102  CO2 29 29  GLUCOSE 139* 119*  BUN 12 10  CREATININE 0.81 0.84  CALCIUM 9.7 8.7   LFTs  Recent Labs  05/07/14 1125  BILITOT 0.4  BILIDIR <0.2  IBILI NOT CALCULATED  ALKPHOS 91  AST 21  ALT 24  PROT 7.7  ALBUMIN 3.5    Recent Labs  05/07/14 1125  LIPASE 18   PT/INR No results found for this basename: LABPROT, INR,  in the last 72 hours    Imaging Studies: Dg Chest 2 View  05/07/2014   CLINICAL DATA:  Four day history of cough, former smoker  EXAM: CHEST  2 VIEW  COMPARISON:  Prior chest x-ray 02/03/2013; CT abdomen/ pelvis performed today 05/07/2014  FINDINGS: Stable cardiac and mediastinal  contours which are within normal limits. The lungs are clear. No focal airspace consolidation, pleural effusion or pneumothorax. No suspicious pulmonary nodule or mass. No acute osseous abnormality.  IMPRESSION: No active cardiopulmonary disease.   Electronically Signed   By: Jacqulynn Cadet M.D.   On: 05/07/2014 12:20   Ct Abdomen Pelvis W Contrast  05/07/2014   CLINICAL DATA:  Abdominal pain, nausea  EXAM: CT ABDOMEN AND PELVIS WITH CONTRAST  TECHNIQUE: Multidetector CT imaging of the abdomen and pelvis was performed using the standard protocol following bolus administration of intravenous contrast.  CONTRAST:  39mL OMNIPAQUE IOHEXOL 300 MG/ML SOLN, 133mL OMNIPAQUE IOHEXOL 300 MG/ML SOLN  COMPARISON:  None.  FINDINGS: Lower Chest: The lung bases are clear. Visualized cardiac structures are within normal limits for size. No pericardial effusion. Unremarkable visualized distal thoracic esophagus.  Abdomen: Unremarkable CT appearance of the stomach, duodenum, spleen, adrenal glands and pancreas. Diffuse hypoattenuation of the hepatic parenchyma consistent with at least moderate steatosis. No discrete hepatic lesion. The gallbladder is surgically absent. No intra or extrahepatic biliary ductal dilatation. Unremarkable appearance of the bilateral kidneys. No focal solid lesion, hydronephrosis or nephrolithiasis. Subtle regions of focal cortical thinning in the superior aspect of the left kidney suggests areas of cortical scarring, perhaps from prior infection.  No focal bowel wall thickening or evidence of obstruction. The appendix is not clearly identified however,  there is no significant inflammatory change in the right lower quadrant to suggest appendicitis.  Pelvis: Surgical changes of prior cholecystectomy. The the bladder dome is patulous and extends upward to be the left ureter consistent with surgical changes of prior psoas hitch procedure. The bilateral ovaries are unremarkable. No free fluid or  suspicious adenopathy.  Bones/Soft Tissues: No acute fracture or aggressive appearing lytic or blastic osseous lesion.  Vascular: No significant atherosclerotic vascular disease, aneurysmal dilatation or acute abnormality.  IMPRESSION: 1. No acute abnormality in the abdomen or pelvis to explain the patient's clinical symptoms. 2. Surgical changes of prior cholecystectomy, hysterectomy and left psoas hitch ureteroneocystostomy procedure. 3. 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 4. The appendix is not clearly identified and may also be surgically absent. No inflammatory change of the right lower quadrant to suggest appendicitis.   Electronically Signed   By: Jacqulynn Cadet M.D.   On: 05/07/2014 12:44  [2 weeks]   Assessment:  63 year old lady presented with acute onset abdominal cramping associated with loose stools and fresh blood per him. Hemoglobin has remained normal. One formed nonbloody stool yesterday. Last colonoscopy 09/2012, normal, Dr. Anthony Sar. Suspect mild ischemic colitis this admission. H/H have remained normal.    Plan: 1. Plan outpatient colonoscopy with Dr. Gala Romney in the upcoming weeks. We will arrange for f/u.    LOS: 2 days   Neil Crouch  05/09/2014, 7:50 AM

## 2014-05-09 NOTE — Progress Notes (Signed)
  PROGRESS NOTE  Beth Phelps [employer] [facility] WUJ:[id number] DOB: 12-16-1950 DOA: 05/07/2014 PCP: Neale Burly, MD  Summary: 63 year old woman presented with acute onset of crampy abdominal pain and rectal bleeding less than 24 hours prior to admission. Initial evaluation was unremarkable.  Assessment/Plan: 1. Rectal bleeding with abdominal cramping. S resolved. GI suspect mild ischemic colitis. Hemoglobin remained stable. No further bleeding since admission.  2. UTI. Empiric Cipro. Followup culture. 3. Diabetes mellitus type 2. Stable.   Plan discharge home today. Followup with Dr. Gala Romney as an outpatient for colonoscopy.  Finish antibiotics for UTI as an outpatient.  Murray Hodgkins, MD  [facility]  Pager [phone] If 7PM-7AM, please contact night-coverage at [email or web address], password [facility] 05/09/2014, 11:04 AM  LOS: 2 days   Consultants:  Gastroenterology  Procedures:    Antibiotics:  Ciprofloxacin 7/23 >>   HPI/Subjective: Feels much better today. No abdominal pain nausea or vomiting. No bleeding. Wants to go home.  Objective: Filed Vitals:   05/08/14 0727 05/08/14 1501 05/08/14 2051 05/09/14 0655  BP: 155/68 136/70 149/88 149/69  Pulse: 87 90 63 62  Temp: 97.4 F (36.3 C) 97.6 F (36.4 C) 98.1 F (36.7 C) 97.7 F (36.5 C)  TempSrc: Oral  Oral Oral  Resp:  16 18 18   Height:      Weight:      SpO2: 95% 96% 98% 96%    Intake/Output Summary (Last 24 hours) at 05/09/14 1104 Last data filed at 05/09/14 1013  Gross per 24 hour  Intake 2596.25 ml  Output   1700 ml  Net 896.25 ml     Filed Weights   05/07/14 1026 05/07/14 1647  Weight: 129.729 kg (286 lb) 125.3 kg (276 lb 3.8 oz)    Exam:     Afebrile, VSS. No hypoxia. Gen. Appears calm and comfortable.  Psych. Speech fluent and clear. Alert. Grossly normal mentation. Cardiovascular. Regular rate and rhythm. No murmur, rub or gallop. Respiratory. Clear to auscultation bilaterally. No wheezes, rales or  rhonchi. Normal respiratory effort. Abdomen. Soft.  Data Reviewed:  Heme: CBC within normal limits.  ID: Urinalysis grossly positive. Urine culture pending.  Scheduled Meds: . ciprofloxacin  250 mg Oral BID  . escitalopram  20 mg Oral Daily  . hydrochlorothiazide  25 mg Oral Daily  . insulin aspart  0-9 Units Subcutaneous TID WC  . metoprolol  100 mg Oral Daily  . pantoprazole  40 mg Oral Daily   Continuous Infusions: . sodium chloride 75 mL/hr at 05/08/14 2340    Principal Problem:   GI bleed Active Problems:   DM   Rectal bleeding

## 2014-05-09 NOTE — Telephone Encounter (Signed)
Patient needs E30 hospital f/u to schedule colonoscopy, last week of August.

## 2014-05-09 NOTE — Progress Notes (Signed)
Pt requesting to have a new diet.  Pt states that she has no pain other than her IV site.  Pt requesting diet education.  Placed a call to [employer] for [employer].  Pt also requesting information on PCP in [place] as she is thinking of moving to the area.  Pt up to chair without assistance and consumed 100% of breakfast without c/o nausea.

## 2014-05-10 LAB — URINE CULTURE: Colony Count: >=100000

## 2014-05-13 NOTE — Telephone Encounter (Signed)
Urine culture resulted, resistant to Cipro. Patient without a phone but was able to reach her through her mother today.  Still has some dysuria, otherwise doing well.  No allergies.  Called in Keflex 500 mg po BID to [employer]. Patient will go pick-up.  Murray Hodgkins, MD [facility] [phone]

## 2014-05-14 ENCOUNTER — Encounter: Payer: Self-pay | Admitting: Gastroenterology

## 2014-05-14 NOTE — Telephone Encounter (Signed)
Pt scheduled in next available which will be 9/2 at 0830 with LSL and appt card mailed

## 2014-06-18 ENCOUNTER — Ambulatory Visit (INDEPENDENT_AMBULATORY_CARE_PROVIDER_SITE_OTHER): Payer: Medicare Other | Admitting: Gastroenterology

## 2014-06-18 ENCOUNTER — Encounter: Payer: Self-pay | Admitting: Gastroenterology

## 2014-06-18 ENCOUNTER — Other Ambulatory Visit: Payer: Self-pay | Admitting: Internal Medicine

## 2014-06-18 ENCOUNTER — Encounter (HOSPITAL_COMMUNITY): Payer: Self-pay | Admitting: Pharmacy Technician

## 2014-06-18 ENCOUNTER — Encounter (INDEPENDENT_AMBULATORY_CARE_PROVIDER_SITE_OTHER): Payer: Self-pay

## 2014-06-18 VITALS — BP 134/88 | HR 83 | Temp 98.2°F | Ht 63.0 in | Wt 275.6 lb

## 2014-06-18 DIAGNOSIS — K5900 Constipation, unspecified: Secondary | ICD-10-CM | POA: Insufficient documentation

## 2014-06-18 DIAGNOSIS — K625 Hemorrhage of anus and rectum: Secondary | ICD-10-CM

## 2014-06-18 DIAGNOSIS — K760 Fatty (change of) liver, not elsewhere classified: Secondary | ICD-10-CM

## 2014-06-18 DIAGNOSIS — K7689 Other specified diseases of liver: Secondary | ICD-10-CM

## 2014-06-18 MED ORDER — LUBIPROSTONE 24 MCG PO CAPS: 24.0000 ug | ORAL_CAPSULE | Freq: Two times a day (BID) | ORAL | Status: DC

## 2014-06-18 MED ORDER — PEG 3350-KCL-NA BICARB-NACL 420 G PO SOLR: 4000.0000 mL | ORAL | Status: DC

## 2014-06-18 NOTE — Assessment & Plan Note (Signed)
Recent rectal bleeding possibly ischemic colitis. Continues to have ongoing constipation/change in bowel habits over the past several months. Add Amitiza 10mcg BID. Plan for colonoscopy as previously recommended.  I have discussed the risks, alternatives, benefits with regards to but not limited to the risk of reaction to medication, bleeding, infection, perforation and the patient is agreeable to proceed. Written consent to be obtained.

## 2014-06-18 NOTE — Progress Notes (Signed)
Cc to pcp °

## 2014-06-18 NOTE — Progress Notes (Signed)
PATIENT NIC'D FOR 6 MONTH FU

## 2014-06-18 NOTE — Patient Instructions (Signed)
1. Start Amitiza 102mcg twice daily with food for constipation.  Colonoscopy as scheduled.   Instructions for fatty liver: Recommend 1-2# weight loss per week until ideal body weight through exercise & diet. Low fat/cholesterol diet.   Avoid sweets, sodas, fruit juices, sweetened beverages like tea, etc. Gradually increase exercise from 15 min daily up to 1 hr per day 5 days/week. Limit alcohol use.   Fatty Liver Fatty liver is the accumulation of fat in liver cells. It is also called hepatosteatosis or steatohepatitis. It is normal for your liver to contain some fat. If fat is more than 5 to 10% of your liver's weight, you have fatty liver.  There are often no symptoms (problems) for years while damage is still occurring. People often learn about their fatty liver when they have medical tests for other reasons. Fat can damage your liver for years or even decades without causing problems. When it becomes severe, it can cause fatigue, weight loss, weakness, and confusion. This makes you more likely to develop more serious liver problems. The liver is the largest organ in the body. It does a lot of work and often gives no warning signs when it is sick until late in a disease. The liver has many important jobs including:  Breaking down foods.  Storing vitamins, iron, and other minerals.  Making proteins.  Making bile for food digestion.  Breaking down many products including medications, alcohol and some poisons. CAUSES  There are a number of different conditions, medications, and poisons that can cause a fatty liver. Eating too many calories causes fat to build up in the liver. Not processing and breaking fats down normally may also cause this. Certain conditions, such as obesity, diabetes, and high triglycerides also cause this. Most fatty liver patients tend to be middle-aged and over weight.  Some causes of fatty liver are:  Alcohol over consumption.  Malnutrition.  Steroid  use.  Valproic acid toxicity.  Obesity.  Cushing's syndrome.  Poisons.  Tetracycline in high dosages.  Pregnancy.  Diabetes.  Hyperlipidemia.  Rapid weight loss. Some people develop fatty liver even having none of these conditions. SYMPTOMS  Fatty liver most often causes no problems. This is called asymptomatic.  It can be diagnosed with blood tests and also by a liver biopsy.  It is one of the most common causes of minor elevations of liver enzymes on routine blood tests.  Specialized Imaging of the liver using ultrasound, CT (computed tomography) scan, or MRI (magnetic resonance imaging) can suggest a fatty liver but a biopsy is needed to confirm it.  A biopsy involves taking a small sample of liver tissue. This is done by using a needle. It is then looked at under a microscope by a specialist. TREATMENT  It is important to treat the cause. Simple fatty liver without a medical reason may not need treatment.  Weight loss, fat restriction, and exercise in overweight patients produces inconsistent results but is worth trying.  Fatty liver due to alcohol toxicity may not improve even with stopping drinking.  Good control of diabetes may reduce fatty liver.  Lower your triglycerides through diet, medication or both.  Eat a balanced, healthy diet.  Increase your physical activity.  Get regular checkups from a liver specialist.  There are no medical or surgical treatments for a fatty liver or NASH, but improving your diet and increasing your exercise may help prevent or reverse some of the damage. PROGNOSIS  Fatty liver may cause no damage or it  can lead to an inflammation of the liver. This is, called steatohepatitis. When it is linked to alcohol abuse, it is called alcoholic steatohepatitis. It often is not linked to alcohol. It is then called nonalcoholic steatohepatitis, or NASH. Over time the liver may become scarred and hardened. This condition is called cirrhosis.  Cirrhosis is serious and may lead to liver failure or cancer. NASH is one of the leading causes of cirrhosis. About 10-20% of Americans have fatty liver and a smaller 2-5% has NASH. Document Released: 11/18/2005 Document Revised: 12/26/2011 Document Reviewed: 02/12/2014 [facility] Patient Information 2015 [place], [place]. This information is not intended to replace advice given to you by your health care provider. Make sure you discuss any questions you have with your health care provider.

## 2014-06-18 NOTE — Assessment & Plan Note (Signed)
Fatty liver based on imaging studies but no evidence of advanced disease. LFTs normal.  Instructions for fatty liver: Recommend 1-2# weight loss per week until ideal body weight through exercise & diet. Low fat/cholesterol diet.   Avoid sweets, sodas, fruit juices, sweetened beverages like tea, etc. Gradually increase exercise from 15 min daily up to 1 hr per day 5 days/week. Limit alcohol use.  OV in 6 months with Dr. Gala Romney.

## 2014-06-18 NOTE — Progress Notes (Signed)
Primary Care Physician:  Neale Burly, MD  Primary Gastroenterologist:  Garfield Cornea, MD   Chief Complaint  Patient presents with  . Follow-up  . Constipation    HPI:  Beth Phelps is a 63 y.o. female here for hospital follow up. Seen on 05/08/14 when she presented to ER with acute onset crampy abdominal pain associated with diarrhea and rectal bleeding. CT unremarkable. Suspected to have ischemic colitis. She recovered very quickly.   H/O fatty liver. Told years ago by Dr. Laural Golden. BM few times per week. Constipated to loose. No melena, brbpr. No abdominal pain. GERD well-controlled on Nexium. No dysphagia.   Current Outpatient Prescriptions  Medication Sig Dispense Refill  . aspirin 325 MG tablet Take 325 mg by mouth daily.      Marland Kitchen escitalopram (LEXAPRO) 20 MG tablet Take 20 mg by mouth daily.        Marland Kitchen esomeprazole (NEXIUM) 40 MG capsule Take 40 mg by mouth daily before breakfast.        . hydrochlorothiazide 25 MG tablet Take 25 mg by mouth daily.        . metFORMIN (GLUCOPHAGE) 500 MG tablet Take 500 mg by mouth daily.        . metoprolol (LOPRESSOR) 50 MG tablet Take 100 mg by mouth daily.        No current facility-administered medications for this visit.    Allergies as of 06/18/2014  . (No Known Allergies)    Past Medical History  Diagnosis Date  . History of sleep apnea   . Diabetes mellitus     type 2  . History of asthma   . GERD (gastroesophageal reflux disease)   . Chronic pain   . Hyperlipidemia   . Obesity   . Fatty liver   . Hiatal hernia 11/15/2004    small  . Migraine   . Herpes   . HTN (hypertension)     Past Surgical History  Procedure Laterality Date  . Abdominal hysterectomy    . Cholecystectomy    . Bladder surgery    . Mole removal      malignant  . Esophagogastroduodenoscopy  11/15/2004    Dr. Laural Golden- small sliding hiatal hernia with mild changes of reflux esophagitis  . Colonoscopy  09/2012    Dr. Anthony Sar: normal. H/O prior polyps.     Family History  Problem Relation Age of Onset  . Heart disease Neg Hx   . Diabetes Father   . Colon cancer Neg Hx   . Colon polyps Father     History   Social History  . Marital Status: Divorced    Spouse Name: N/A    Number of Children: N/A  . Years of Education: N/A   Occupational History  . Not on file.   Social History Main Topics  . Smoking status: Former Research scientist (life sciences)  . Smokeless tobacco: Never Used  . Alcohol Use: No  . Drug Use: No  . Sexual Activity: No   Other Topics Concern  . Not on file   Social History Narrative   Divorced      ROS:  General: Negative for anorexia, weight loss, fever, chills, fatigue, weakness. Eyes: Negative for vision changes.  ENT: Negative for hoarseness, difficulty swallowing , nasal congestion. CV: Negative for chest pain, angina, palpitations, dyspnea on exertion, peripheral edema.  Respiratory: Negative for dyspnea at rest, dyspnea on exertion, cough, sputum, wheezing.  GI: See history of present illness. GU:  Negative for dysuria, hematuria, urinary  incontinence, urinary frequency, nocturnal urination.  MS: Negative for joint pain, low back pain.  Derm: Negative for rash or itching.  Neuro: Negative for weakness, abnormal sensation, seizure, frequent headaches, memory loss, confusion.  Psych: Negative for anxiety, depression, suicidal ideation, hallucinations.  Endo: Negative for unusual weight change.  Heme: Negative for bruising or bleeding. Allergy: Negative for rash or hives.    Physical Examination:  BP 134/88  Pulse 83  Temp(Src) 98.2 F (36.8 C) (Oral)  Ht 5\' 3"  (1.6 m)  Wt 275 lb 9.6 oz (125.011 kg)  BMI 48.83 kg/m2   General: Well-nourished, well-developed in no acute distress.  Head: Normocephalic, atraumatic.   Eyes: Conjunctiva pink, no icterus. Mouth: Oropharyngeal mucosa moist and pink , no lesions erythema or exudate. Neck: Supple without thyromegaly, masses, or lymphadenopathy.  Lungs: Clear to  auscultation bilaterally.  Heart: Regular rate and rhythm, no murmurs rubs or gallops.  Abdomen: Bowel sounds are normal, nontender, nondistended, no hepatosplenomegaly or masses, no abdominal bruits or    hernia , no rebound or guarding.   Rectal: deferred Extremities: No lower extremity edema. No clubbing or deformities.  Neuro: Alert and oriented x 4 , grossly normal neurologically.  Skin: Warm and dry, no rash or jaundice.   Psych: Alert and cooperative, normal mood and affect.  Labs: Lab Results  Component Value Date   WBC 8.0 05/09/2014   HGB 14.1 05/09/2014   HCT 41.8 05/09/2014   MCV 85.0 05/09/2014   PLT 244 05/09/2014   Lab Results  Component Value Date   CREATININE 0.84 05/08/2014   BUN 10 05/08/2014   NA 142 05/08/2014   K 3.6* 05/08/2014   CL 102 05/08/2014   CO2 29 05/08/2014   Lab Results  Component Value Date   ALT 24 05/07/2014   AST 21 05/07/2014   ALKPHOS 91 05/07/2014   BILITOT 0.4 05/07/2014     Imaging Studies: No results found.

## 2014-06-18 NOTE — Progress Notes (Signed)
Patient needs return OV in six months with Dr. Gala Romney for f/u fatty liver.

## 2014-07-02 ENCOUNTER — Encounter (HOSPITAL_COMMUNITY)
Admission: RE | Disposition: A | Discharge status: Home / Self Care | Payer: Self-pay | Source: Ambulatory Visit | Attending: Internal Medicine

## 2014-07-02 ENCOUNTER — Encounter (HOSPITAL_COMMUNITY): Payer: Self-pay

## 2014-07-02 ENCOUNTER — Ambulatory Visit (HOSPITAL_COMMUNITY)
Admission: RE | Admit: 2014-07-02 | Discharge: 2014-07-02 | Disposition: A | Discharge status: Home / Self Care | Payer: Medicare Other | Source: Ambulatory Visit | Attending: Internal Medicine | Admitting: Internal Medicine

## 2014-07-02 DIAGNOSIS — Z86010 Personal history of colonic polyps: Secondary | ICD-10-CM

## 2014-07-02 DIAGNOSIS — E785 Hyperlipidemia, unspecified: Secondary | ICD-10-CM | POA: Diagnosis not present

## 2014-07-02 DIAGNOSIS — E119 Type 2 diabetes mellitus without complications: Secondary | ICD-10-CM | POA: Insufficient documentation

## 2014-07-02 DIAGNOSIS — Z87891 Personal history of nicotine dependence: Secondary | ICD-10-CM | POA: Diagnosis not present

## 2014-07-02 DIAGNOSIS — Z79899 Other long term (current) drug therapy: Secondary | ICD-10-CM | POA: Insufficient documentation

## 2014-07-02 DIAGNOSIS — I10 Essential (primary) hypertension: Secondary | ICD-10-CM | POA: Diagnosis not present

## 2014-07-02 DIAGNOSIS — D126 Benign neoplasm of colon, unspecified: Secondary | ICD-10-CM

## 2014-07-02 DIAGNOSIS — R109 Unspecified abdominal pain: Secondary | ICD-10-CM | POA: Diagnosis not present

## 2014-07-02 DIAGNOSIS — E669 Obesity, unspecified: Secondary | ICD-10-CM | POA: Insufficient documentation

## 2014-07-02 DIAGNOSIS — K5900 Constipation, unspecified: Secondary | ICD-10-CM

## 2014-07-02 DIAGNOSIS — K760 Fatty (change of) liver, not elsewhere classified: Secondary | ICD-10-CM

## 2014-07-02 DIAGNOSIS — K921 Melena: Secondary | ICD-10-CM | POA: Diagnosis present

## 2014-07-02 DIAGNOSIS — K625 Hemorrhage of anus and rectum: Secondary | ICD-10-CM

## 2014-07-02 DIAGNOSIS — Z7982 Long term (current) use of aspirin: Secondary | ICD-10-CM | POA: Diagnosis not present

## 2014-07-02 HISTORY — DX: Sleep apnea, unspecified: G47.30

## 2014-07-02 HISTORY — PX: COLONOSCOPY: SHX5424

## 2014-07-02 LAB — GLUCOSE, CAPILLARY: Glucose-Capillary: 180 mg/dL — ABNORMAL HIGH (ref 70–99)

## 2014-07-02 SURGERY — COLONOSCOPY
Anesthesia: Moderate Sedation

## 2014-07-02 MED ORDER — MIDAZOLAM HCL 5 MG/5ML IJ SOLN
INTRAMUSCULAR | Status: DC | PRN
  Administered 2014-07-02: 2 mg via INTRAVENOUS
  Administered 2014-07-02: 1 mg via INTRAVENOUS

## 2014-07-02 MED ORDER — MEPERIDINE HCL 100 MG/ML IJ SOLN
INTRAMUSCULAR | Status: DC | PRN
  Administered 2014-07-02: 25 mg via INTRAVENOUS
  Administered 2014-07-02: 50 mg via INTRAVENOUS

## 2014-07-02 MED ORDER — MIDAZOLAM HCL 5 MG/5ML IJ SOLN
INTRAMUSCULAR | Status: AC
  Filled 2014-07-02: qty 10

## 2014-07-02 MED ORDER — ONDANSETRON HCL 4 MG/2ML IJ SOLN
INTRAMUSCULAR | Status: DC
Start: 2014-07-02 — End: 2014-07-02
  Filled 2014-07-02: qty 2

## 2014-07-02 MED ORDER — MEPERIDINE HCL 100 MG/ML IJ SOLN
INTRAMUSCULAR | Status: DC
Start: 2014-07-02 — End: 2014-07-02
  Filled 2014-07-02: qty 2

## 2014-07-02 MED ORDER — SODIUM CHLORIDE 0.9 % IV SOLN
INTRAVENOUS | Status: DC
  Administered 2014-07-02: 07:00:00 via INTRAVENOUS

## 2014-07-02 MED ORDER — ONDANSETRON HCL 4 MG/2ML IJ SOLN
INTRAMUSCULAR | Status: DC | PRN
  Administered 2014-07-02: 4 mg via INTRAVENOUS

## 2014-07-02 MED ORDER — SIMETHICONE 40 MG/0.6ML PO SUSP
ORAL | Status: DC | PRN
  Administered 2014-07-02: 08:00:00

## 2014-07-02 NOTE — Discharge Instructions (Addendum)
°Colonoscopy °Discharge Instructions ° °Read the instructions outlined below and refer to this sheet in the next few weeks. These discharge instructions provide you with general information on caring for yourself after you leave the hospital. Your doctor may also give you specific instructions. While your treatment has been planned according to the most current medical practices available, unavoidable complications occasionally occur. If you have any problems or questions after discharge, call Dr. Rourk at [phone]. °ACTIVITY °· You may resume your regular activity, but move at a slower pace for the next 24 hours.  °· Take frequent rest periods for the next 24 hours.  °· Walking will help get rid of the air and reduce the bloated feeling in your belly (abdomen).  °· No driving for 24 hours (because of the medicine (anesthesia) used during the test).   °· Do not sign any important legal documents or operate any machinery for 24 hours (because of the anesthesia used during the test).  °NUTRITION °· Drink plenty of fluids.  °· You may resume your normal diet as instructed by your doctor.  °· Begin with a light meal and progress to your normal diet. Heavy or fried foods are harder to digest and may make you feel sick to your stomach (nauseated).  °· Avoid alcoholic beverages for 24 hours or as instructed.  °MEDICATIONS °· You may resume your normal medications unless your doctor tells you otherwise.  °WHAT YOU CAN EXPECT TODAY °· Some feelings of bloating in the abdomen.  °· Passage of more gas than usual.  °· Spotting of blood in your stool or on the toilet paper.  °IF YOU HAD POLYPS REMOVED DURING THE COLONOSCOPY: °· No aspirin products for 7 days or as instructed.  °· No alcohol for 7 days or as instructed.  °· Eat a soft diet for the next 24 hours.  °FINDING OUT THE RESULTS OF YOUR TEST °Not all test results are available during your visit. If your test results are not back during the visit, make an appointment  with your caregiver to find out the results. Do not assume everything is normal if you have not heard from your caregiver or the medical facility. It is important for you to follow up on all of your test results.  °SEEK IMMEDIATE MEDICAL ATTENTION IF: °· You have more than a spotting of blood in your stool.  °· Your belly is swollen (abdominal distention).  °· You are nauseated or vomiting.  °· You have a temperature over 101.  °· You have abdominal pain or discomfort that is severe or gets worse throughout the day.  ° ° °Polyp information provided ° °Further recommendations to follow pending review of pathology report ° °Colon Polyps °Polyps are lumps of extra tissue growing inside the body. Polyps can grow in the large intestine (colon). Most colon polyps are noncancerous (benign). However, some colon polyps can become cancerous over time. Polyps that are larger than a pea may be harmful. To be safe, caregivers remove and test all polyps. °CAUSES  °Polyps form when mutations in the genes cause your cells to grow and divide even though no more tissue is needed. °RISK FACTORS °There are a number of risk factors that can increase your chances of getting colon polyps. They include: °· Being older than 50 years. °· Family history of colon polyps or colon cancer. °· Long-term colon diseases, such as colitis or Crohn disease. °· Being overweight. °· Smoking. °· Being inactive. °· Drinking too much alcohol. °SYMPTOMS  °  Most small polyps do not cause symptoms. If symptoms are present, they may include: °· Blood in the stool. The stool may look dark red or black. °· Constipation or diarrhea that lasts longer than 1 week. °DIAGNOSIS °People often do not know they have polyps until their caregiver finds them during a regular checkup. Your caregiver can use 4 tests to check for polyps: °· Digital rectal exam. The caregiver wears gloves and feels inside the rectum. This test would find polyps only in the rectum. °· Barium  enema. The caregiver puts a liquid called barium into your rectum before taking X-rays of your colon. Barium makes your colon look white. Polyps are dark, so they are easy to see in the X-ray pictures. °· Sigmoidoscopy. A thin, flexible tube (sigmoidoscope) is placed into your rectum. The sigmoidoscope has a light and tiny camera in it. The caregiver uses the sigmoidoscope to look at the last third of your colon. °· Colonoscopy. This test is like sigmoidoscopy, but the caregiver looks at the entire colon. This is the most common method for finding and removing polyps. °TREATMENT  °Any polyps will be removed during a sigmoidoscopy or colonoscopy. The polyps are then tested for cancer. °PREVENTION  °To help lower your risk of getting more colon polyps: °· Eat plenty of fruits and vegetables. Avoid eating fatty foods. °· Do not smoke. °· Avoid drinking alcohol. °· Exercise every day. °· Lose weight if recommended by your caregiver. °· Eat plenty of calcium and folate. Foods that are rich in calcium include milk, cheese, and broccoli. Foods that are rich in folate include chickpeas, kidney beans, and spinach. °HOME CARE INSTRUCTIONS °Keep all follow-up appointments as directed by your caregiver. You may need periodic exams to check for polyps. °SEEK MEDICAL CARE IF: °You notice bleeding during a bowel movement. °Document Released: 06/29/2004 Document Revised: 12/26/2011 Document Reviewed: 12/13/2011 °ExitCare® Patient Information ©2015 [employer]. This information is not intended to replace advice given to you by your health care provider. Make sure you discuss any questions you have with your health care provider. ° °

## 2014-07-02 NOTE — H&P (View-Only) (Signed)
Primary Care Physician:  Neale Burly, MD  Primary Gastroenterologist:  Garfield Cornea, MD   Chief Complaint  Patient presents with  . Follow-up  . Constipation    HPI:  Beth Phelps is a 63 y.o. female here for hospital follow up. Seen on 05/08/14 when she presented to ER with acute onset crampy abdominal pain associated with diarrhea and rectal bleeding. CT unremarkable. Suspected to have ischemic colitis. She recovered very quickly.   H/O fatty liver. Told years ago by Dr. Laural Golden. BM few times per week. Constipated to loose. No melena, brbpr. No abdominal pain. GERD well-controlled on Nexium. No dysphagia.   Current Outpatient Prescriptions  Medication Sig Dispense Refill  . aspirin 325 MG tablet Take 325 mg by mouth daily.      Marland Kitchen escitalopram (LEXAPRO) 20 MG tablet Take 20 mg by mouth daily.        Marland Kitchen esomeprazole (NEXIUM) 40 MG capsule Take 40 mg by mouth daily before breakfast.        . hydrochlorothiazide 25 MG tablet Take 25 mg by mouth daily.        . metFORMIN (GLUCOPHAGE) 500 MG tablet Take 500 mg by mouth daily.        . metoprolol (LOPRESSOR) 50 MG tablet Take 100 mg by mouth daily.        No current facility-administered medications for this visit.    Allergies as of 06/18/2014  . (No Known Allergies)    Past Medical History  Diagnosis Date  . History of sleep apnea   . Diabetes mellitus     type 2  . History of asthma   . GERD (gastroesophageal reflux disease)   . Chronic pain   . Hyperlipidemia   . Obesity   . Fatty liver   . Hiatal hernia 11/15/2004    small  . Migraine   . Herpes   . HTN (hypertension)     Past Surgical History  Procedure Laterality Date  . Abdominal hysterectomy    . Cholecystectomy    . Bladder surgery    . Mole removal      malignant  . Esophagogastroduodenoscopy  11/15/2004    Dr. Laural Golden- small sliding hiatal hernia with mild changes of reflux esophagitis  . Colonoscopy  09/2012    Dr. Anthony Sar: normal. H/O prior polyps.     Family History  Problem Relation Age of Onset  . Heart disease Neg Hx   . Diabetes Father   . Colon cancer Neg Hx   . Colon polyps Father     History   Social History  . Marital Status: Divorced    Spouse Name: N/A    Number of Children: N/A  . Years of Education: N/A   Occupational History  . Not on file.   Social History Main Topics  . Smoking status: Former Research scientist (life sciences)  . Smokeless tobacco: Never Used  . Alcohol Use: No  . Drug Use: No  . Sexual Activity: No   Other Topics Concern  . Not on file   Social History Narrative   Divorced      ROS:  General: Negative for anorexia, weight loss, fever, chills, fatigue, weakness. Eyes: Negative for vision changes.  ENT: Negative for hoarseness, difficulty swallowing , nasal congestion. CV: Negative for chest pain, angina, palpitations, dyspnea on exertion, peripheral edema.  Respiratory: Negative for dyspnea at rest, dyspnea on exertion, cough, sputum, wheezing.  GI: See history of present illness. GU:  Negative for dysuria, hematuria, urinary  incontinence, urinary frequency, nocturnal urination.  MS: Negative for joint pain, low back pain.  Derm: Negative for rash or itching.  Neuro: Negative for weakness, abnormal sensation, seizure, frequent headaches, memory loss, confusion.  Psych: Negative for anxiety, depression, suicidal ideation, hallucinations.  Endo: Negative for unusual weight change.  Heme: Negative for bruising or bleeding. Allergy: Negative for rash or hives.    Physical Examination:  BP 134/88  Pulse 83  Temp(Src) 98.2 F (36.8 C) (Oral)  Ht 5\' 3"  (1.6 m)  Wt 275 lb 9.6 oz (125.011 kg)  BMI 48.83 kg/m2   General: Well-nourished, well-developed in no acute distress.  Head: Normocephalic, atraumatic.   Eyes: Conjunctiva pink, no icterus. Mouth: Oropharyngeal mucosa moist and pink , no lesions erythema or exudate. Neck: Supple without thyromegaly, masses, or lymphadenopathy.  Lungs: Clear to  auscultation bilaterally.  Heart: Regular rate and rhythm, no murmurs rubs or gallops.  Abdomen: Bowel sounds are normal, nontender, nondistended, no hepatosplenomegaly or masses, no abdominal bruits or    hernia , no rebound or guarding.   Rectal: deferred Extremities: No lower extremity edema. No clubbing or deformities.  Neuro: Alert and oriented x 4 , grossly normal neurologically.  Skin: Warm and dry, no rash or jaundice.   Psych: Alert and cooperative, normal mood and affect.  Labs: Lab Results  Component Value Date   WBC 8.0 05/09/2014   HGB 14.1 05/09/2014   HCT 41.8 05/09/2014   MCV 85.0 05/09/2014   PLT 244 05/09/2014   Lab Results  Component Value Date   CREATININE 0.84 05/08/2014   BUN 10 05/08/2014   NA 142 05/08/2014   K 3.6* 05/08/2014   CL 102 05/08/2014   CO2 29 05/08/2014   Lab Results  Component Value Date   ALT 24 05/07/2014   AST 21 05/07/2014   ALKPHOS 91 05/07/2014   BILITOT 0.4 05/07/2014     Imaging Studies: No results found.

## 2014-07-02 NOTE — Op Note (Signed)
[facility] [address] [place], [address]   COLONOSCOPY PROCEDURE REPORT  PATIENT: Beth Phelps, Beth Phelps  MR#:         [id number] BIRTHDATE: Jul 25, 1951 , 63  yrs. old GENDER: Female ENDOSCOPIST: R.  Garfield Cornea, MD FACP FACG REFERRED BY:  Nadeen Landau, M.D. PROCEDURE DATE:  07/02/2014 PROCEDURE:     Colonoscopy with snare polypectomy and biopsy  INDICATIONS: Recent hematochezia abdominal pain  INFORMED CONSENT:  The risks, benefits, alternatives and imponderables including but not limited to bleeding, perforation as well as the possibility of a missed lesion have been reviewed.  The potential for biopsy, lesion removal, etc. have also been discussed.  Questions have been answered.  All parties agreeable. Please see the history and physical in the medical record for more information.  MEDICATIONS: Versed 3 mg IV and Demerol 75 mg IV in divided doses. Zofran 4 mg IV.  DESCRIPTION OF PROCEDURE:  After a digital rectal exam was performed, the EC-[id number] ([id number])  colonoscope was advanced from the anus through the rectum and colon to the area of the cecum, ileocecal valve and appendiceal orifice.  The cecum was deeply intubated.  These structures were well-seen and photographed for the record.  From the level of the cecum and ileocecal valve, the scope was slowly and cautiously withdrawn.  The mucosal surfaces were carefully surveyed utilizing scope tip deflection to facilitate fold flattening as needed.  The scope was pulled down into the rectum where a thorough examination  was performed.    FINDINGS:  Normal rectum. Rectal vault small. Unable to retroflex. Rectal mucosa seen well on-face.  (2) diminutive polyps at the rectosigmoid junction there was another (1) diminutive polyp in the mid descending segment. There was (1) 4 mm pedunculated polyp in the ascending segment; otherwise, the remainder of colonic mucosa appeared normal.  THERAPEUTIC / DIAGNOSTIC  MANEUVERS PERFORMED:  The polyps left colon were removed with cold biopsy forcep technique. The ascending colon polyp was cold snare removed.  COMPLICATIONS: none  CECAL WITHDRAWAL TIME:  11 minutes  IMPRESSION:  Colonic polyps-removed as described above. I suspect a recent bout of ischemic or segmental colitis as the cause of this lady's acute illness.  RECOMMENDATIONS: Followup on pathology.   _______________________________ eSigned:  R. Garfield Cornea, MD FACP [facility] 07/02/2014 8:28 AM   CC:

## 2014-07-02 NOTE — Interval H&P Note (Signed)
History and Physical Interval Note:  07/02/2014 7:37 AM  Beth Phelps  has presented today for surgery, with the diagnosis of CONSTIPATION, RECTAL BLEED, FATTY LIVER  The various methods of treatment have been discussed with the patient and family. After consideration of risks, benefits and other options for treatment, the patient has consented to  Procedure(s) with comments: COLONOSCOPY (N/A) - 7:30 as a surgical intervention .  The patient's history has been reviewed, patient examined, no change in status, stable for surgery.  I have reviewed the patient's chart and labs.  Questions were answered to the patient's satisfaction.     Robert Rourk  No further rectal bleeding. No change. Colonoscopy per plan.The risks, benefits, limitations, alternatives and imponderables have been reviewed with the patient. Questions have been answered. All parties are agreeable.

## 2014-07-03 ENCOUNTER — Encounter: Payer: Self-pay | Admitting: Internal Medicine

## 2014-07-04 ENCOUNTER — Encounter (HOSPITAL_COMMUNITY): Payer: Self-pay | Admitting: Internal Medicine

## 2014-11-18 ENCOUNTER — Encounter: Payer: Self-pay | Admitting: Internal Medicine

## 2015-10-21 ENCOUNTER — Ambulatory Visit (INDEPENDENT_AMBULATORY_CARE_PROVIDER_SITE_OTHER): Payer: Medicare Other | Admitting: Podiatry

## 2015-10-21 ENCOUNTER — Encounter: Payer: Self-pay | Admitting: Podiatry

## 2015-10-21 VITALS — BP 120/72 | HR 72

## 2015-10-21 DIAGNOSIS — B351 Tinea unguium: Secondary | ICD-10-CM | POA: Diagnosis not present

## 2015-10-21 DIAGNOSIS — M79676 Pain in unspecified toe(s): Secondary | ICD-10-CM

## 2015-10-21 DIAGNOSIS — E119 Type 2 diabetes mellitus without complications: Secondary | ICD-10-CM | POA: Diagnosis not present

## 2015-10-21 NOTE — Progress Notes (Signed)
Subjective:     Patient ID: Beth Phelps, female   DOB: Mar 11, 1951, 65 y.o.   MRN: [id number]  HPI this diabetic patient presents the office for an evaluation of her feet and nails. She says she is diabetic for 10 years and she is taking medication for her diabetes. She states that she also has long thick nails that she is unable to self treated herself. She relates that her blood sugar is very high at this point, she desires a foot exam as well as treatment of her painful thick nails   Review of Systems     Objective:   Physical Exam GENERAL APPEARANCE: Alert, conversant. Appropriately groomed. No acute distress.  VASCULAR: Pedal pulses palpable at  [facility] and PT bilateral.  Capillary refill time is immediate to all digits,  Normal temperature gradient.  Digital hair growth is present bilateral  NEUROLOGIC: sensation is normal to 5.07 monofilament at 5/5 sites bilateral.  Light touch is intact bilateral, Muscle strength normal.  MUSCULOSKELETAL: acceptable muscle strength, tone and stability bilateral.  Intrinsic muscluature intact bilateral.  Rectus appearance of foot and digits noted bilateral.   DERMATOLOGIC: skin color, texture, and turgor are within normal limits.  No preulcerative lesions or ulcers  are seen, no interdigital maceration noted.  No open lesions present.  . No drainage noted.  NAILS  Thick disfigured discolored 1,2 nails both feet.     Assessment:     Onychomycosis  Diabetes with no complications.     Plan:     IE  Debridement of Nails.  RTC 4 months.   Gardiner Barefoot DPM

## 2015-12-18 DIAGNOSIS — M1711 Unilateral primary osteoarthritis, right knee: Secondary | ICD-10-CM | POA: Diagnosis not present

## 2015-12-31 DIAGNOSIS — E119 Type 2 diabetes mellitus without complications: Secondary | ICD-10-CM | POA: Diagnosis not present

## 2016-01-01 DIAGNOSIS — G4733 Obstructive sleep apnea (adult) (pediatric): Secondary | ICD-10-CM | POA: Diagnosis not present

## 2016-01-01 DIAGNOSIS — Z0000 Encounter for general adult medical examination without abnormal findings: Secondary | ICD-10-CM | POA: Diagnosis not present

## 2016-01-01 DIAGNOSIS — E119 Type 2 diabetes mellitus without complications: Secondary | ICD-10-CM | POA: Diagnosis not present

## 2016-01-01 DIAGNOSIS — I10 Essential (primary) hypertension: Secondary | ICD-10-CM | POA: Diagnosis not present

## 2016-01-20 DIAGNOSIS — M1711 Unilateral primary osteoarthritis, right knee: Secondary | ICD-10-CM | POA: Diagnosis not present

## 2016-02-17 ENCOUNTER — Ambulatory Visit: Payer: Medicare Other | Admitting: Podiatry

## 2016-02-18 DIAGNOSIS — E119 Type 2 diabetes mellitus without complications: Secondary | ICD-10-CM | POA: Diagnosis not present

## 2016-04-11 DIAGNOSIS — I10 Essential (primary) hypertension: Secondary | ICD-10-CM | POA: Diagnosis not present

## 2016-04-11 DIAGNOSIS — G4733 Obstructive sleep apnea (adult) (pediatric): Secondary | ICD-10-CM | POA: Diagnosis not present

## 2016-04-11 DIAGNOSIS — E782 Mixed hyperlipidemia: Secondary | ICD-10-CM | POA: Diagnosis not present

## 2016-04-11 DIAGNOSIS — E119 Type 2 diabetes mellitus without complications: Secondary | ICD-10-CM | POA: Diagnosis not present

## 2016-07-14 DIAGNOSIS — I10 Essential (primary) hypertension: Secondary | ICD-10-CM | POA: Diagnosis not present

## 2016-07-14 DIAGNOSIS — E119 Type 2 diabetes mellitus without complications: Secondary | ICD-10-CM | POA: Diagnosis not present

## 2016-07-14 DIAGNOSIS — Z1389 Encounter for screening for other disorder: Secondary | ICD-10-CM | POA: Diagnosis not present

## 2016-07-14 DIAGNOSIS — Z0000 Encounter for general adult medical examination without abnormal findings: Secondary | ICD-10-CM | POA: Diagnosis not present

## 2016-07-14 DIAGNOSIS — G4733 Obstructive sleep apnea (adult) (pediatric): Secondary | ICD-10-CM | POA: Diagnosis not present

## 2016-07-25 DIAGNOSIS — Z1231 Encounter for screening mammogram for malignant neoplasm of breast: Secondary | ICD-10-CM | POA: Diagnosis not present

## 2016-08-16 DIAGNOSIS — K219 Gastro-esophageal reflux disease without esophagitis: Secondary | ICD-10-CM | POA: Diagnosis not present

## 2016-08-16 DIAGNOSIS — Z79899 Other long term (current) drug therapy: Secondary | ICD-10-CM | POA: Diagnosis not present

## 2016-08-16 DIAGNOSIS — E7800 Pure hypercholesterolemia, unspecified: Secondary | ICD-10-CM | POA: Diagnosis not present

## 2016-08-16 DIAGNOSIS — Z7982 Long term (current) use of aspirin: Secondary | ICD-10-CM | POA: Diagnosis not present

## 2016-08-16 DIAGNOSIS — E119 Type 2 diabetes mellitus without complications: Secondary | ICD-10-CM | POA: Diagnosis not present

## 2016-08-16 DIAGNOSIS — I10 Essential (primary) hypertension: Secondary | ICD-10-CM | POA: Diagnosis not present

## 2016-08-16 DIAGNOSIS — N390 Urinary tract infection, site not specified: Secondary | ICD-10-CM | POA: Diagnosis not present

## 2016-08-16 DIAGNOSIS — Z7984 Long term (current) use of oral hypoglycemic drugs: Secondary | ICD-10-CM | POA: Diagnosis not present

## 2016-08-23 DIAGNOSIS — H524 Presbyopia: Secondary | ICD-10-CM | POA: Diagnosis not present

## 2016-08-23 DIAGNOSIS — E11319 Type 2 diabetes mellitus with unspecified diabetic retinopathy without macular edema: Secondary | ICD-10-CM | POA: Diagnosis not present

## 2016-08-31 DIAGNOSIS — N390 Urinary tract infection, site not specified: Secondary | ICD-10-CM | POA: Diagnosis not present

## 2016-09-05 DIAGNOSIS — I10 Essential (primary) hypertension: Secondary | ICD-10-CM | POA: Diagnosis not present

## 2016-09-05 DIAGNOSIS — E119 Type 2 diabetes mellitus without complications: Secondary | ICD-10-CM | POA: Diagnosis not present

## 2016-10-07 DIAGNOSIS — E119 Type 2 diabetes mellitus without complications: Secondary | ICD-10-CM | POA: Diagnosis not present

## 2016-10-07 DIAGNOSIS — I10 Essential (primary) hypertension: Secondary | ICD-10-CM | POA: Diagnosis not present

## 2016-10-31 DIAGNOSIS — E119 Type 2 diabetes mellitus without complications: Secondary | ICD-10-CM | POA: Diagnosis not present

## 2016-10-31 DIAGNOSIS — I10 Essential (primary) hypertension: Secondary | ICD-10-CM | POA: Diagnosis not present

## 2016-12-12 DIAGNOSIS — I10 Essential (primary) hypertension: Secondary | ICD-10-CM | POA: Diagnosis not present

## 2016-12-12 DIAGNOSIS — E119 Type 2 diabetes mellitus without complications: Secondary | ICD-10-CM | POA: Diagnosis not present

## 2017-01-06 DIAGNOSIS — Z0000 Encounter for general adult medical examination without abnormal findings: Secondary | ICD-10-CM | POA: Diagnosis not present

## 2017-01-06 DIAGNOSIS — E1165 Type 2 diabetes mellitus with hyperglycemia: Secondary | ICD-10-CM | POA: Diagnosis not present

## 2017-01-06 DIAGNOSIS — E559 Vitamin D deficiency, unspecified: Secondary | ICD-10-CM | POA: Diagnosis not present

## 2017-01-06 DIAGNOSIS — N390 Urinary tract infection, site not specified: Secondary | ICD-10-CM | POA: Diagnosis not present

## 2017-01-06 DIAGNOSIS — Z79899 Other long term (current) drug therapy: Secondary | ICD-10-CM | POA: Diagnosis not present

## 2017-01-06 DIAGNOSIS — R5383 Other fatigue: Secondary | ICD-10-CM | POA: Diagnosis not present

## 2017-01-06 DIAGNOSIS — E7800 Pure hypercholesterolemia, unspecified: Secondary | ICD-10-CM | POA: Diagnosis not present

## 2017-01-06 DIAGNOSIS — R0602 Shortness of breath: Secondary | ICD-10-CM | POA: Diagnosis not present

## 2017-01-25 DIAGNOSIS — R9431 Abnormal electrocardiogram [ECG] [EKG]: Secondary | ICD-10-CM | POA: Diagnosis not present

## 2017-01-25 DIAGNOSIS — E1165 Type 2 diabetes mellitus with hyperglycemia: Secondary | ICD-10-CM | POA: Diagnosis not present

## 2017-01-25 DIAGNOSIS — M79652 Pain in left thigh: Secondary | ICD-10-CM | POA: Diagnosis not present

## 2017-01-25 DIAGNOSIS — I10 Essential (primary) hypertension: Secondary | ICD-10-CM | POA: Diagnosis not present

## 2017-01-25 DIAGNOSIS — S76912A Strain of unspecified muscles, fascia and tendons at thigh level, left thigh, initial encounter: Secondary | ICD-10-CM | POA: Diagnosis not present

## 2017-02-01 DIAGNOSIS — I10 Essential (primary) hypertension: Secondary | ICD-10-CM | POA: Diagnosis not present

## 2017-02-01 DIAGNOSIS — E1165 Type 2 diabetes mellitus with hyperglycemia: Secondary | ICD-10-CM | POA: Diagnosis not present

## 2017-02-01 DIAGNOSIS — L508 Other urticaria: Secondary | ICD-10-CM | POA: Diagnosis not present

## 2017-02-01 DIAGNOSIS — C4491 Basal cell carcinoma of skin, unspecified: Secondary | ICD-10-CM | POA: Diagnosis not present

## 2017-02-02 DIAGNOSIS — C4491 Basal cell carcinoma of skin, unspecified: Secondary | ICD-10-CM | POA: Diagnosis not present

## 2017-02-02 DIAGNOSIS — E1165 Type 2 diabetes mellitus with hyperglycemia: Secondary | ICD-10-CM | POA: Diagnosis not present

## 2017-02-02 DIAGNOSIS — I10 Essential (primary) hypertension: Secondary | ICD-10-CM | POA: Diagnosis not present

## 2017-02-02 DIAGNOSIS — D485 Neoplasm of uncertain behavior of skin: Secondary | ICD-10-CM | POA: Diagnosis not present

## 2017-02-16 DIAGNOSIS — I10 Essential (primary) hypertension: Secondary | ICD-10-CM | POA: Diagnosis not present

## 2017-02-16 DIAGNOSIS — E1165 Type 2 diabetes mellitus with hyperglycemia: Secondary | ICD-10-CM | POA: Diagnosis not present

## 2017-02-16 DIAGNOSIS — C4491 Basal cell carcinoma of skin, unspecified: Secondary | ICD-10-CM | POA: Diagnosis not present

## 2017-02-16 DIAGNOSIS — D485 Neoplasm of uncertain behavior of skin: Secondary | ICD-10-CM | POA: Diagnosis not present

## 2017-02-25 DIAGNOSIS — C4491 Basal cell carcinoma of skin, unspecified: Secondary | ICD-10-CM | POA: Diagnosis not present

## 2017-02-25 DIAGNOSIS — E1165 Type 2 diabetes mellitus with hyperglycemia: Secondary | ICD-10-CM | POA: Diagnosis not present

## 2017-02-25 DIAGNOSIS — E559 Vitamin D deficiency, unspecified: Secondary | ICD-10-CM | POA: Diagnosis not present

## 2017-02-25 DIAGNOSIS — I10 Essential (primary) hypertension: Secondary | ICD-10-CM | POA: Diagnosis not present

## 2017-03-17 DIAGNOSIS — E119 Type 2 diabetes mellitus without complications: Secondary | ICD-10-CM | POA: Diagnosis not present

## 2017-03-17 DIAGNOSIS — L309 Dermatitis, unspecified: Secondary | ICD-10-CM | POA: Diagnosis not present

## 2017-03-17 DIAGNOSIS — E1165 Type 2 diabetes mellitus with hyperglycemia: Secondary | ICD-10-CM | POA: Diagnosis not present

## 2017-03-31 DIAGNOSIS — M79604 Pain in right leg: Secondary | ICD-10-CM | POA: Diagnosis not present

## 2017-03-31 DIAGNOSIS — E1140 Type 2 diabetes mellitus with diabetic neuropathy, unspecified: Secondary | ICD-10-CM | POA: Diagnosis not present

## 2017-03-31 DIAGNOSIS — M79605 Pain in left leg: Secondary | ICD-10-CM | POA: Diagnosis not present

## 2017-04-04 DIAGNOSIS — M79604 Pain in right leg: Secondary | ICD-10-CM | POA: Diagnosis not present

## 2017-04-13 ENCOUNTER — Encounter (HOSPITAL_COMMUNITY): Payer: Self-pay | Admitting: *Deleted

## 2017-04-13 ENCOUNTER — Emergency Department (HOSPITAL_COMMUNITY)
Admission: EM | Admit: 2017-04-13 | Discharge: 2017-04-13 | Disposition: A | Discharge status: Home / Self Care | Payer: Medicare Other | Attending: Emergency Medicine | Admitting: Emergency Medicine

## 2017-04-13 DIAGNOSIS — Z7984 Long term (current) use of oral hypoglycemic drugs: Secondary | ICD-10-CM | POA: Insufficient documentation

## 2017-04-13 DIAGNOSIS — X501XXA Overexertion from prolonged static or awkward postures, initial encounter: Secondary | ICD-10-CM | POA: Insufficient documentation

## 2017-04-13 DIAGNOSIS — Z7982 Long term (current) use of aspirin: Secondary | ICD-10-CM | POA: Diagnosis not present

## 2017-04-13 DIAGNOSIS — Z87891 Personal history of nicotine dependence: Secondary | ICD-10-CM | POA: Diagnosis not present

## 2017-04-13 DIAGNOSIS — M545 Low back pain, unspecified: Secondary | ICD-10-CM

## 2017-04-13 DIAGNOSIS — Y929 Unspecified place or not applicable: Secondary | ICD-10-CM | POA: Diagnosis not present

## 2017-04-13 DIAGNOSIS — S3992XA Unspecified injury of lower back, initial encounter: Secondary | ICD-10-CM | POA: Diagnosis present

## 2017-04-13 DIAGNOSIS — I10 Essential (primary) hypertension: Secondary | ICD-10-CM | POA: Insufficient documentation

## 2017-04-13 DIAGNOSIS — Y93E2 Activity, laundry: Secondary | ICD-10-CM | POA: Diagnosis not present

## 2017-04-13 DIAGNOSIS — Z79899 Other long term (current) drug therapy: Secondary | ICD-10-CM | POA: Insufficient documentation

## 2017-04-13 DIAGNOSIS — E119 Type 2 diabetes mellitus without complications: Secondary | ICD-10-CM | POA: Diagnosis not present

## 2017-04-13 DIAGNOSIS — J45909 Unspecified asthma, uncomplicated: Secondary | ICD-10-CM | POA: Insufficient documentation

## 2017-04-13 DIAGNOSIS — S39012A Strain of muscle, fascia and tendon of lower back, initial encounter: Secondary | ICD-10-CM

## 2017-04-13 DIAGNOSIS — Y998 Other external cause status: Secondary | ICD-10-CM | POA: Diagnosis not present

## 2017-04-13 MED ORDER — METHOCARBAMOL 500 MG PO TABS
500.0000 mg | ORAL_TABLET | Freq: Two times a day (BID) | ORAL | 0 refills | Status: DC
Start: 2017-04-13 — End: 2020-02-05

## 2017-04-13 MED ORDER — METHOCARBAMOL 500 MG PO TABS
500.0000 mg | ORAL_TABLET | Freq: Once | ORAL | Status: AC
  Administered 2017-04-13: 500 mg via ORAL
  Filled 2017-04-13: qty 1

## 2017-04-13 NOTE — Discharge Instructions (Signed)
Please read and follow all provided instructions.  Your diagnoses today include:  1. Acute bilateral low back pain without sciatica   2. Strain of lumbar region, initial encounter    Tests performed today include: Vital signs - see below for your results today  Medications prescribed:   Take any prescribed medications only as directed.  Home care instructions:  Follow any educational materials contained in this packet Please rest, use ice or heat on your back for the next several days Do not lift, push, pull anything more than 10 pounds for the next week  Follow-up instructions: Please follow-up with your primary care provider in the next 1 week for further evaluation of your symptoms.   Return instructions:  SEEK IMMEDIATE MEDICAL ATTENTION IF YOU HAVE: New numbness, tingling, weakness, or problem with the use of your arms or legs Severe back pain not relieved with medications Loss control of your bowels or bladder Increasing pain in any areas of the body (such as chest or abdominal pain) Shortness of breath, dizziness, or fainting.  Worsening nausea (feeling sick to your stomach), vomiting, fever, or sweats Any other emergent concerns regarding your health   Additional Information:  Your vital signs today were: BP (!) 123/59 (BP Location: Right Arm)    Pulse 74    Temp 98.1 F (36.7 C) (Oral)    Resp 16    Ht 5\' 3"  (1.6 m)    Wt 124.7 kg (275 lb)    SpO2 96%    BMI 48.71 kg/m  If your blood pressure (BP) was elevated above 135/85 this visit, please have this repeated by your doctor within one month. --------------

## 2017-04-13 NOTE — ED Provider Notes (Signed)
[facility] DEPT Provider Note   CSN: [id number] Arrival date & time: 04/13/17  1038     History   Chief Complaint Chief Complaint  Patient presents with  . Back Pain    HPI Beth Phelps is a 66 y.o. female.  HPI 66 y.o. female with a hx of DM, HTN, HLD, presents to the Emergency Department today due to low back pain that started on Monday. Notes pain while doing laundry after bending over and picking up laundry. Notes pain bilateral lower lumbar musculature. No midline tenderness. No loss of bowel or bladder function. No saddle anesthesia. No dysuria. States pain worse with lying down and sitting. Improved with standing. No meds PTA. Rates pain 8/10. Aching sensation. No other symptoms noted.   Past Medical History:  Diagnosis Date  . Chronic pain   . Diabetes mellitus    type 2  . Fatty liver   . GERD (gastroesophageal reflux disease)   . Herpes   . Hiatal hernia 11/15/2004   small  . History of asthma   . History of sleep apnea   . HTN (hypertension)   . Hyperlipidemia   . Migraine   . Obesity   . Sleep apnea     Patient Active Problem List   Diagnosis Date Noted  . Unspecified constipation 06/18/2014  . Fatty liver 06/18/2014  . UTI (urinary tract infection) 05/09/2014  . Ischemic colitis ([place]) 05/09/2014  . GI bleed 05/07/2014  . Rectal bleeding 05/07/2014  . DM 08/24/2010  . HYPERLIPIDEMIA 08/24/2010  . HYPERTENSION 08/24/2010  . ASTHMA 08/24/2010  . GERD 08/24/2010  . UNSPECIFIED SLEEP APNEA 08/24/2010  . CHEST PAIN UNSPECIFIED 08/24/2010    Past Surgical History:  Procedure Laterality Date  . ABDOMINAL HYSTERECTOMY    . BLADDER SURGERY    . CHOLECYSTECTOMY    . COLONOSCOPY  09/2012   Dr. Anthony Sar: normal. H/O prior polyps.  . COLONOSCOPY N/A 07/02/2014   Procedure: COLONOSCOPY;  Surgeon: Daneil Dolin, MD;  Location: [facility] SUITE;  Service: Endoscopy;  Laterality: N/A;  7:30  . ESOPHAGOGASTRODUODENOSCOPY  11/15/2004   Dr. Laural Golden- small  sliding hiatal hernia with mild changes of reflux esophagitis  . MOLE REMOVAL     malignant    OB History    No data available       Home Medications    Prior to Admission medications   Medication Sig Start Date End Date Taking? Authorizing Provider  aspirin 325 MG tablet Take 325 mg by mouth daily.    [provider]  escitalopram (LEXAPRO) 20 MG tablet Take 20 mg by mouth daily.      [provider]  esomeprazole (NEXIUM) 40 MG capsule Take 40 mg by mouth daily before breakfast.      [provider]  GLIPIZIDE PO Take 2 mg by mouth 2 (two) times daily.     [provider]  hydrochlorothiazide 25 MG tablet Take 25 mg by mouth daily.      [provider]  lubiprostone (AMITIZA) 24 MCG capsule Take 1 capsule (24 mcg total) by mouth 2 (two) times daily with a meal. 06/18/14   Mahala Menghini, PA-C  meloxicam (MOBIC) 7.5 MG tablet Take 7.5 mg by mouth daily.    [provider]  metFORMIN (GLUCOPHAGE) 500 MG tablet Take 500 mg by mouth daily.      [provider]  metoprolol (LOPRESSOR) 50 MG tablet Take 100 mg by mouth daily.  [provider]    Family History Family History  Problem Relation Age of Onset  . Diabetes Father   . Colon polyps Father   . Heart disease Neg Hx   . Colon cancer Neg Hx     Social History Social History  Substance Use Topics  . Smoking status: Former Smoker    Packs/day: 1.00    Years: 20.00    Types: Cigarettes    Quit date: 07/02/1994  . Smokeless tobacco: Never Used  . Alcohol use No     Allergies   Patient has no known allergies.   Review of Systems Review of Systems  Constitutional: Negative for fever.  Gastrointestinal: Negative for nausea and vomiting.  Genitourinary: Negative for dysuria.  Musculoskeletal: Positive for back pain.  Neurological: Negative for numbness.   Physical Exam Updated Vital Signs BP (!) 123/59 (BP Location: Right Arm)   Pulse 74    Temp 98.1 F (36.7 C) (Oral)   Resp 16   Ht 5\' 3"  (1.6 m)   Wt 124.7 kg (275 lb)   SpO2 96%   BMI 48.71 kg/m   Physical Exam  Constitutional: She is oriented to person, place, and time. Vital signs are normal. She appears well-developed and well-nourished.  HENT:  Head: Normocephalic.  Right Ear: Hearing normal.  Left Ear: Hearing normal.  Eyes: Conjunctivae and EOM are normal. Pupils are equal, round, and reactive to light.  Cardiovascular: Normal rate and regular rhythm.   Pulmonary/Chest: Effort normal.  Musculoskeletal:  TTP bilateral lower lumbar musculature. No midline tenderness. No palpable or visible step off or deformities.   Neurological: She is alert and oriented to person, place, and time. She has normal strength. No sensory deficit.  BLE motor/sensation intact.  Skin: Skin is warm and dry.  Psychiatric: She has a normal mood and affect. Her speech is normal and behavior is normal. Thought content normal.  Nursing note and vitals reviewed.    ED Treatments / Results  Labs (all labs ordered are listed, but only abnormal results are displayed) Labs Reviewed - No data to display  EKG  EKG Interpretation None       Radiology No results found.  Procedures Procedures (including critical care time)  Medications Ordered in ED Medications - No data to display   Initial Impression / Assessment and Plan / ED Course  I have reviewed the triage vital signs and the nursing notes.  Pertinent labs & imaging results that were available during my care of the patient were reviewed by me and considered in my medical decision making (see chart for details).  Final Clinical Impressions(s) / ED Diagnoses     {I have reviewed the relevant previous healthcare records.  {I obtained HPI from historian.   ED Course:  Assessment: Patient is a 66 y.o. female who presents to the ED with back pain. Likely lumbar strain due to positional injury. No neurological deficits  appreciated. Patient is ambulatory. No warning symptoms of back pain including: fecal incontinence, urinary retention or overflow incontinence, night sweats, waking from sleep with back pain, unexplained fevers or weight loss, h/o cancer, IVDU, recent trauma. No concern for cauda equina, epidural abscess, or other serious cause of back pain. Conservative measures such as rest, ice/heat and pain medicine indicated with PCP follow-up if no improvement with conservative management  Disposition/Plan:  DC Home Additional Verbal discharge instructions given and discussed with patient.  Pt Instructed to f/u with PCP in the next week for evaluation and  treatment of symptoms. Return precautions given Pt acknowledges and agrees with plan  Supervising Physician Milton Ferguson, MD  Final diagnoses:  Acute bilateral low back pain without sciatica  Strain of lumbar region, initial encounter    New Prescriptions New Prescriptions   No medications on file     Shary Decamp, Hershal Coria 04/13/17 [place]    Milton Ferguson, MD 04/14/17 1021

## 2017-04-13 NOTE — ED Triage Notes (Signed)
Pt comes in with lower back pain starting Monday. States she did laundry Monday morning and noticed the pain after that. She adds that she did have a shooting pain in her left leg 2 weeks ago but that is now better. Denies any urinary problems.

## 2017-05-21 DIAGNOSIS — R110 Nausea: Secondary | ICD-10-CM | POA: Diagnosis not present

## 2017-05-21 DIAGNOSIS — K219 Gastro-esophageal reflux disease without esophagitis: Secondary | ICD-10-CM | POA: Diagnosis not present

## 2017-05-21 DIAGNOSIS — Z7984 Long term (current) use of oral hypoglycemic drugs: Secondary | ICD-10-CM | POA: Diagnosis not present

## 2017-05-21 DIAGNOSIS — E119 Type 2 diabetes mellitus without complications: Secondary | ICD-10-CM | POA: Diagnosis not present

## 2017-05-21 DIAGNOSIS — R42 Dizziness and giddiness: Secondary | ICD-10-CM | POA: Diagnosis not present

## 2017-05-21 DIAGNOSIS — I10 Essential (primary) hypertension: Secondary | ICD-10-CM | POA: Diagnosis not present

## 2017-05-21 DIAGNOSIS — Z7982 Long term (current) use of aspirin: Secondary | ICD-10-CM | POA: Diagnosis not present

## 2017-05-21 DIAGNOSIS — Z79899 Other long term (current) drug therapy: Secondary | ICD-10-CM | POA: Diagnosis not present

## 2017-06-01 DIAGNOSIS — E119 Type 2 diabetes mellitus without complications: Secondary | ICD-10-CM | POA: Diagnosis not present

## 2018-05-11 ENCOUNTER — Encounter: Payer: Self-pay | Admitting: Internal Medicine

## 2018-06-19 ENCOUNTER — Encounter: Payer: Self-pay | Admitting: Podiatry

## 2018-06-19 ENCOUNTER — Ambulatory Visit: Payer: Medicare Other | Admitting: Podiatry

## 2018-06-19 DIAGNOSIS — M79675 Pain in left toe(s): Secondary | ICD-10-CM

## 2018-06-19 DIAGNOSIS — B351 Tinea unguium: Secondary | ICD-10-CM | POA: Diagnosis not present

## 2018-06-19 DIAGNOSIS — M79674 Pain in right toe(s): Secondary | ICD-10-CM

## 2018-06-19 DIAGNOSIS — M2040 Other hammer toe(s) (acquired), unspecified foot: Secondary | ICD-10-CM | POA: Diagnosis not present

## 2018-06-19 DIAGNOSIS — M2010 Hallux valgus (acquired), unspecified foot: Secondary | ICD-10-CM

## 2018-06-19 DIAGNOSIS — E1142 Type 2 diabetes mellitus with diabetic polyneuropathy: Secondary | ICD-10-CM

## 2018-06-19 NOTE — Progress Notes (Signed)
Complaint:  Visit Type: Patient returns to my office for continued preventative foot care services. Complaint: Patient states" my nails have grown long and thick and become painful to walk and wear shoes" Patient has been diagnosed with DM with neuropathy.. The patient presents for preventative foot care services. No changes to ROS  Podiatric Exam: Vascular: dorsalis pedis and posterior tibial pulses are palpable bilateral. Capillary return is immediate. Temperature gradient is WNL. Skin turgor WNL  Sensorium: Diminished  Semmes Weinstein monofilament test. Normal tactile sensation bilaterally. Nail Exam: Pt has thick disfigured discolored nails with subungual debris noted bilateral entire nail hallux through second  toenails Ulcer Exam: There is no evidence of ulcer or pre-ulcerative changes or infection. Orthopedic Exam: Muscle tone and strength are WNL. No limitations in general ROM. No crepitus or effusions noted. Foot type and digits show no abnormalities.  HAV  B/L.  Hammer toe 2  B/L. Skin: No Porokeratosis. No infection or ulcers  Diagnosis:  Onychomycosis, , Pain in right toe, pain in left toes  DPN  HAV  HT  B/L  Treatment & Plan Procedures and Treatment: Consent by patient was obtained for treatment procedures.   Debridement of mycotic and hypertrophic toenails, 1 through 2 bilateral and clearing of subungual debris. No ulceration, no infection noted. Patient to be evaluated by Liliane Channel for diabetic shoes. Return Visit-Office Procedure: Patient instructed to return to the office for a follow up visit 3 months for continued evaluation and treatment.    Gardiner Barefoot DPM

## 2018-06-27 ENCOUNTER — Telehealth: Payer: Self-pay | Admitting: Podiatry

## 2018-06-27 NOTE — Telephone Encounter (Signed)
Called pt last week and again this week to see if she has seen a MD/DO because Arsenio Katz is a NP and cannot sign off on diabetic shoe paperwork but there is no answer and no voicemail set up.Marland KitchenMarland Kitchen

## 2018-07-10 ENCOUNTER — Telehealth: Payer: Self-pay | Admitting: Podiatry

## 2018-07-10 NOTE — Telephone Encounter (Signed)
Pt called back after seeing missed call and I explained that per Medicare guidelines she has to see the MD/DO for them to sign off on the paperwork for diabetic shoes. I asked pt if there was a md/do in the practice where she sees the PA and she said yes and I told her to call and make an appt with him/her. She said she would have to get back to me on this.

## 2018-07-10 NOTE — Telephone Encounter (Signed)
Called pt again trying to talk with pt about her diabetic shoes she ordered. She gave [place] the doctors name but it is a PA and per medicare guide lines pt has to see MD/DO for diabetic shoe paperwork to be filled out. Pts only number has a voicemail that is full. This is the 3rd attempt to contact pt.

## 2018-08-08 ENCOUNTER — Ambulatory Visit: Payer: Medicare Other | Admitting: Nurse Practitioner

## 2018-08-20 DIAGNOSIS — D1801 Hemangioma of skin and subcutaneous tissue: Secondary | ICD-10-CM | POA: Insufficient documentation

## 2018-09-19 ENCOUNTER — Ambulatory Visit: Payer: Medicare Other | Admitting: Podiatry

## 2019-09-18 ENCOUNTER — Ambulatory Visit: Payer: Medicare Other | Admitting: Urology

## 2019-10-28 DIAGNOSIS — E041 Nontoxic single thyroid nodule: Secondary | ICD-10-CM | POA: Diagnosis not present

## 2019-10-31 DIAGNOSIS — E1165 Type 2 diabetes mellitus with hyperglycemia: Secondary | ICD-10-CM | POA: Diagnosis not present

## 2019-10-31 DIAGNOSIS — Z299 Encounter for prophylactic measures, unspecified: Secondary | ICD-10-CM | POA: Diagnosis not present

## 2019-10-31 DIAGNOSIS — E041 Nontoxic single thyroid nodule: Secondary | ICD-10-CM | POA: Diagnosis not present

## 2019-10-31 DIAGNOSIS — I10 Essential (primary) hypertension: Secondary | ICD-10-CM | POA: Diagnosis not present

## 2019-10-31 DIAGNOSIS — E113293 Type 2 diabetes mellitus with mild nonproliferative diabetic retinopathy without macular edema, bilateral: Secondary | ICD-10-CM | POA: Diagnosis not present

## 2019-10-31 DIAGNOSIS — Z87891 Personal history of nicotine dependence: Secondary | ICD-10-CM | POA: Diagnosis not present

## 2019-11-13 DIAGNOSIS — E113293 Type 2 diabetes mellitus with mild nonproliferative diabetic retinopathy without macular edema, bilateral: Secondary | ICD-10-CM | POA: Diagnosis not present

## 2019-11-13 DIAGNOSIS — I10 Essential (primary) hypertension: Secondary | ICD-10-CM | POA: Diagnosis not present

## 2019-11-13 DIAGNOSIS — M4802 Spinal stenosis, cervical region: Secondary | ICD-10-CM | POA: Diagnosis not present

## 2019-11-13 DIAGNOSIS — E1165 Type 2 diabetes mellitus with hyperglycemia: Secondary | ICD-10-CM | POA: Diagnosis not present

## 2019-11-13 DIAGNOSIS — Z299 Encounter for prophylactic measures, unspecified: Secondary | ICD-10-CM | POA: Diagnosis not present

## 2019-11-21 DIAGNOSIS — E113293 Type 2 diabetes mellitus with mild nonproliferative diabetic retinopathy without macular edema, bilateral: Secondary | ICD-10-CM | POA: Diagnosis not present

## 2019-11-21 DIAGNOSIS — Z299 Encounter for prophylactic measures, unspecified: Secondary | ICD-10-CM | POA: Diagnosis not present

## 2019-11-21 DIAGNOSIS — R197 Diarrhea, unspecified: Secondary | ICD-10-CM | POA: Diagnosis not present

## 2019-11-21 DIAGNOSIS — M25569 Pain in unspecified knee: Secondary | ICD-10-CM | POA: Diagnosis not present

## 2019-11-21 DIAGNOSIS — K589 Irritable bowel syndrome without diarrhea: Secondary | ICD-10-CM | POA: Diagnosis not present

## 2019-11-26 DIAGNOSIS — E1140 Type 2 diabetes mellitus with diabetic neuropathy, unspecified: Secondary | ICD-10-CM | POA: Diagnosis not present

## 2019-11-26 DIAGNOSIS — M79672 Pain in left foot: Secondary | ICD-10-CM | POA: Diagnosis not present

## 2019-11-26 DIAGNOSIS — E1159 Type 2 diabetes mellitus with other circulatory complications: Secondary | ICD-10-CM | POA: Diagnosis not present

## 2019-11-26 DIAGNOSIS — M79671 Pain in right foot: Secondary | ICD-10-CM | POA: Diagnosis not present

## 2019-12-10 DIAGNOSIS — Z808 Family history of malignant neoplasm of other organs or systems: Secondary | ICD-10-CM | POA: Diagnosis not present

## 2019-12-10 DIAGNOSIS — E119 Type 2 diabetes mellitus without complications: Secondary | ICD-10-CM | POA: Diagnosis not present

## 2019-12-10 DIAGNOSIS — E041 Nontoxic single thyroid nodule: Secondary | ICD-10-CM | POA: Diagnosis not present

## 2019-12-10 DIAGNOSIS — I10 Essential (primary) hypertension: Secondary | ICD-10-CM | POA: Diagnosis not present

## 2019-12-12 DIAGNOSIS — Z299 Encounter for prophylactic measures, unspecified: Secondary | ICD-10-CM | POA: Diagnosis not present

## 2019-12-12 DIAGNOSIS — E1142 Type 2 diabetes mellitus with diabetic polyneuropathy: Secondary | ICD-10-CM | POA: Diagnosis not present

## 2019-12-12 DIAGNOSIS — E113293 Type 2 diabetes mellitus with mild nonproliferative diabetic retinopathy without macular edema, bilateral: Secondary | ICD-10-CM | POA: Diagnosis not present

## 2019-12-12 DIAGNOSIS — I10 Essential (primary) hypertension: Secondary | ICD-10-CM | POA: Diagnosis not present

## 2019-12-12 DIAGNOSIS — E1165 Type 2 diabetes mellitus with hyperglycemia: Secondary | ICD-10-CM | POA: Diagnosis not present

## 2019-12-15 DIAGNOSIS — E1165 Type 2 diabetes mellitus with hyperglycemia: Secondary | ICD-10-CM | POA: Diagnosis not present

## 2019-12-18 ENCOUNTER — Other Ambulatory Visit (HOSPITAL_COMMUNITY): Payer: Self-pay | Admitting: Endocrinology

## 2019-12-18 DIAGNOSIS — E041 Nontoxic single thyroid nodule: Secondary | ICD-10-CM

## 2019-12-30 ENCOUNTER — Ambulatory Visit
Admission: RE | Admit: 2019-12-30 | Discharge: 2019-12-30 | Disposition: A | Discharge status: Home / Self Care | Payer: Self-pay | Source: Ambulatory Visit | Attending: Endocrinology | Admitting: Endocrinology

## 2019-12-30 ENCOUNTER — Other Ambulatory Visit (HOSPITAL_COMMUNITY): Payer: Self-pay | Admitting: Endocrinology

## 2019-12-30 DIAGNOSIS — E041 Nontoxic single thyroid nodule: Secondary | ICD-10-CM

## 2020-01-06 DIAGNOSIS — Z299 Encounter for prophylactic measures, unspecified: Secondary | ICD-10-CM | POA: Diagnosis not present

## 2020-01-06 DIAGNOSIS — Z0000 Encounter for general adult medical examination without abnormal findings: Secondary | ICD-10-CM | POA: Diagnosis not present

## 2020-01-06 DIAGNOSIS — E1142 Type 2 diabetes mellitus with diabetic polyneuropathy: Secondary | ICD-10-CM | POA: Diagnosis not present

## 2020-01-06 DIAGNOSIS — Z79899 Other long term (current) drug therapy: Secondary | ICD-10-CM | POA: Diagnosis not present

## 2020-01-06 DIAGNOSIS — E1165 Type 2 diabetes mellitus with hyperglycemia: Secondary | ICD-10-CM | POA: Diagnosis not present

## 2020-01-06 DIAGNOSIS — Z7189 Other specified counseling: Secondary | ICD-10-CM | POA: Diagnosis not present

## 2020-01-06 DIAGNOSIS — Z1211 Encounter for screening for malignant neoplasm of colon: Secondary | ICD-10-CM | POA: Diagnosis not present

## 2020-01-06 DIAGNOSIS — R5383 Other fatigue: Secondary | ICD-10-CM | POA: Diagnosis not present

## 2020-01-06 DIAGNOSIS — I10 Essential (primary) hypertension: Secondary | ICD-10-CM | POA: Diagnosis not present

## 2020-01-08 NOTE — Progress Notes (Signed)
Beth Phelps. [facility] Female, 69 y.o., 1951-01-21 MRN:  [id number] Phone:  [phone] Beth Phelps) PCP:  Precious Gilding, PA Coverage:  [place] [employer] Medicare/[facility] Medicare  RE: Biopsy Received: Today Message Contents  Arne Cleveland, MD  Ernestene Mention   [place] FNA L mid  Can go to [facility]   DDH   Previous Messages  ----- Message -----  From: Lenore Cordia  Sent: 01/08/2020  3:30 PM EDT  To: Beth Phelps  Subject: Biopsy                      Procedure Requested: [place] FNA    Reason for Procedure: Thyroid Nodule    Provider Requesting: Jacelyn Pi  Provider Telephone: [phone]    Other Info: Out side films uploaded            Accession Number [id number] CHL

## 2020-01-09 ENCOUNTER — Encounter (HOSPITAL_COMMUNITY): Payer: Self-pay | Admitting: Radiology

## 2020-01-09 NOTE — Progress Notes (Signed)
[facility] 69 [facility]., September 22, 1951 MRN:  [id number] Phone:  [phone] [name]) PCP:  Precious Gilding, PA Coverage:  [place] [facility] ([facility]) 01/16/2020 at 10:00 AM  FW: Biopsy Received: Today Message Contents  Donn Pierini D      Previous Messages   ----- Message -----  From: Arne Cleveland, MD  Sent: 01/09/2020  8:28 AM EDT  To: Lenore Cordia  Subject: RE: Biopsy                    Yes find a day Thornton Papas or PA is up there   Thx  DDH   ----- Message -----  From: Lenore Cordia  Sent: 01/09/2020  8:13 AM EDT  To: Arne Cleveland, MD  Subject: RE: Biopsy                    Good Morning   Per [name] of [facility]  Patient stated she would like to go to [facility]?  Is that possible?  ----- Message -----  From: Arne Cleveland, MD  Sent: 01/08/2020  4:06 PM EDT  To: Lenore Cordia  Subject: RE: Biopsy                    Ok   [place] FNA L mid  Can go to [facility]   DDH    ----- Message -----  From: Lenore Cordia  Sent: 01/08/2020  3:30 PM EDT  To: Ir Procedure Requests  Subject: Biopsy                      Procedure Requested: [place] FNA    Reason for Procedure: Thyroid Nodule    Provider Requesting: Jacelyn Pi  Provider Telephone: [phone]    Other Info: Out side films uploaded            Accession Number [id number] CHL

## 2020-01-13 DIAGNOSIS — K219 Gastro-esophageal reflux disease without esophagitis: Secondary | ICD-10-CM | POA: Diagnosis not present

## 2020-01-13 DIAGNOSIS — Z87891 Personal history of nicotine dependence: Secondary | ICD-10-CM | POA: Diagnosis not present

## 2020-01-13 DIAGNOSIS — Z7984 Long term (current) use of oral hypoglycemic drugs: Secondary | ICD-10-CM | POA: Diagnosis not present

## 2020-01-13 DIAGNOSIS — L02211 Cutaneous abscess of abdominal wall: Secondary | ICD-10-CM | POA: Diagnosis not present

## 2020-01-13 DIAGNOSIS — R072 Precordial pain: Secondary | ICD-10-CM | POA: Diagnosis not present

## 2020-01-13 DIAGNOSIS — I10 Essential (primary) hypertension: Secondary | ICD-10-CM | POA: Diagnosis not present

## 2020-01-13 DIAGNOSIS — Z20822 Contact with and (suspected) exposure to covid-19: Secondary | ICD-10-CM | POA: Diagnosis not present

## 2020-01-13 DIAGNOSIS — R531 Weakness: Secondary | ICD-10-CM | POA: Diagnosis not present

## 2020-01-13 DIAGNOSIS — Z7982 Long term (current) use of aspirin: Secondary | ICD-10-CM | POA: Diagnosis not present

## 2020-01-13 DIAGNOSIS — N390 Urinary tract infection, site not specified: Secondary | ICD-10-CM | POA: Diagnosis not present

## 2020-01-13 DIAGNOSIS — B9629 Other Escherichia coli [E. coli] as the cause of diseases classified elsewhere: Secondary | ICD-10-CM | POA: Diagnosis not present

## 2020-01-13 DIAGNOSIS — Z79899 Other long term (current) drug therapy: Secondary | ICD-10-CM | POA: Diagnosis not present

## 2020-01-13 DIAGNOSIS — E113299 Type 2 diabetes mellitus with mild nonproliferative diabetic retinopathy without macular edema, unspecified eye: Secondary | ICD-10-CM | POA: Diagnosis not present

## 2020-01-13 DIAGNOSIS — E1140 Type 2 diabetes mellitus with diabetic neuropathy, unspecified: Secondary | ICD-10-CM | POA: Diagnosis not present

## 2020-01-13 DIAGNOSIS — R079 Chest pain, unspecified: Secondary | ICD-10-CM | POA: Diagnosis not present

## 2020-01-14 ENCOUNTER — Other Ambulatory Visit: Payer: Medicare Other

## 2020-01-14 DIAGNOSIS — E1165 Type 2 diabetes mellitus with hyperglycemia: Secondary | ICD-10-CM | POA: Diagnosis not present

## 2020-01-14 DIAGNOSIS — E1140 Type 2 diabetes mellitus with diabetic neuropathy, unspecified: Secondary | ICD-10-CM | POA: Diagnosis not present

## 2020-01-14 DIAGNOSIS — N390 Urinary tract infection, site not specified: Secondary | ICD-10-CM | POA: Diagnosis not present

## 2020-01-14 DIAGNOSIS — R079 Chest pain, unspecified: Secondary | ICD-10-CM | POA: Diagnosis not present

## 2020-01-14 DIAGNOSIS — I10 Essential (primary) hypertension: Secondary | ICD-10-CM | POA: Diagnosis not present

## 2020-01-14 DIAGNOSIS — L662 Folliculitis decalvans: Secondary | ICD-10-CM | POA: Diagnosis not present

## 2020-01-16 ENCOUNTER — Encounter (HOSPITAL_COMMUNITY): Payer: Self-pay

## 2020-01-16 ENCOUNTER — Ambulatory Visit (HOSPITAL_COMMUNITY): Admission: RE | Admit: 2020-01-16 | Payer: Medicare Other | Source: Ambulatory Visit

## 2020-01-16 DIAGNOSIS — Z4800 Encounter for change or removal of nonsurgical wound dressing: Secondary | ICD-10-CM | POA: Diagnosis not present

## 2020-01-16 DIAGNOSIS — I10 Essential (primary) hypertension: Secondary | ICD-10-CM | POA: Diagnosis not present

## 2020-01-16 DIAGNOSIS — B9629 Other Escherichia coli [E. coli] as the cause of diseases classified elsewhere: Secondary | ICD-10-CM | POA: Diagnosis not present

## 2020-01-16 DIAGNOSIS — N12 Tubulo-interstitial nephritis, not specified as acute or chronic: Secondary | ICD-10-CM | POA: Diagnosis not present

## 2020-01-16 DIAGNOSIS — Z792 Long term (current) use of antibiotics: Secondary | ICD-10-CM | POA: Diagnosis not present

## 2020-01-16 DIAGNOSIS — Z7984 Long term (current) use of oral hypoglycemic drugs: Secondary | ICD-10-CM | POA: Diagnosis not present

## 2020-01-16 DIAGNOSIS — L739 Follicular disorder, unspecified: Secondary | ICD-10-CM | POA: Diagnosis not present

## 2020-01-16 DIAGNOSIS — Z9181 History of falling: Secondary | ICD-10-CM | POA: Diagnosis not present

## 2020-01-16 DIAGNOSIS — M5412 Radiculopathy, cervical region: Secondary | ICD-10-CM | POA: Diagnosis not present

## 2020-01-16 DIAGNOSIS — R42 Dizziness and giddiness: Secondary | ICD-10-CM | POA: Diagnosis not present

## 2020-01-16 DIAGNOSIS — E1165 Type 2 diabetes mellitus with hyperglycemia: Secondary | ICD-10-CM | POA: Diagnosis not present

## 2020-01-18 DIAGNOSIS — L739 Follicular disorder, unspecified: Secondary | ICD-10-CM | POA: Diagnosis not present

## 2020-01-18 DIAGNOSIS — N12 Tubulo-interstitial nephritis, not specified as acute or chronic: Secondary | ICD-10-CM | POA: Diagnosis not present

## 2020-01-18 DIAGNOSIS — I10 Essential (primary) hypertension: Secondary | ICD-10-CM | POA: Diagnosis not present

## 2020-01-18 DIAGNOSIS — Z7984 Long term (current) use of oral hypoglycemic drugs: Secondary | ICD-10-CM | POA: Diagnosis not present

## 2020-01-18 DIAGNOSIS — E1165 Type 2 diabetes mellitus with hyperglycemia: Secondary | ICD-10-CM | POA: Diagnosis not present

## 2020-01-18 DIAGNOSIS — R42 Dizziness and giddiness: Secondary | ICD-10-CM | POA: Diagnosis not present

## 2020-01-18 DIAGNOSIS — Z792 Long term (current) use of antibiotics: Secondary | ICD-10-CM | POA: Diagnosis not present

## 2020-01-18 DIAGNOSIS — B9629 Other Escherichia coli [E. coli] as the cause of diseases classified elsewhere: Secondary | ICD-10-CM | POA: Diagnosis not present

## 2020-01-18 DIAGNOSIS — Z9181 History of falling: Secondary | ICD-10-CM | POA: Diagnosis not present

## 2020-01-18 DIAGNOSIS — Z4800 Encounter for change or removal of nonsurgical wound dressing: Secondary | ICD-10-CM | POA: Diagnosis not present

## 2020-01-18 DIAGNOSIS — M5412 Radiculopathy, cervical region: Secondary | ICD-10-CM | POA: Diagnosis not present

## 2020-01-20 DIAGNOSIS — L739 Follicular disorder, unspecified: Secondary | ICD-10-CM | POA: Diagnosis not present

## 2020-01-20 DIAGNOSIS — N12 Tubulo-interstitial nephritis, not specified as acute or chronic: Secondary | ICD-10-CM | POA: Diagnosis not present

## 2020-01-20 DIAGNOSIS — Z792 Long term (current) use of antibiotics: Secondary | ICD-10-CM | POA: Diagnosis not present

## 2020-01-20 DIAGNOSIS — E1165 Type 2 diabetes mellitus with hyperglycemia: Secondary | ICD-10-CM | POA: Diagnosis not present

## 2020-01-20 DIAGNOSIS — Z7984 Long term (current) use of oral hypoglycemic drugs: Secondary | ICD-10-CM | POA: Diagnosis not present

## 2020-01-20 DIAGNOSIS — B9629 Other Escherichia coli [E. coli] as the cause of diseases classified elsewhere: Secondary | ICD-10-CM | POA: Diagnosis not present

## 2020-01-20 DIAGNOSIS — M5412 Radiculopathy, cervical region: Secondary | ICD-10-CM | POA: Diagnosis not present

## 2020-01-20 DIAGNOSIS — R42 Dizziness and giddiness: Secondary | ICD-10-CM | POA: Diagnosis not present

## 2020-01-20 DIAGNOSIS — Z9181 History of falling: Secondary | ICD-10-CM | POA: Diagnosis not present

## 2020-01-20 DIAGNOSIS — Z4800 Encounter for change or removal of nonsurgical wound dressing: Secondary | ICD-10-CM | POA: Diagnosis not present

## 2020-01-20 DIAGNOSIS — I10 Essential (primary) hypertension: Secondary | ICD-10-CM | POA: Diagnosis not present

## 2020-01-21 DIAGNOSIS — L739 Follicular disorder, unspecified: Secondary | ICD-10-CM | POA: Insufficient documentation

## 2020-01-22 DIAGNOSIS — Z299 Encounter for prophylactic measures, unspecified: Secondary | ICD-10-CM | POA: Diagnosis not present

## 2020-01-22 DIAGNOSIS — E1165 Type 2 diabetes mellitus with hyperglycemia: Secondary | ICD-10-CM | POA: Diagnosis not present

## 2020-01-22 DIAGNOSIS — I10 Essential (primary) hypertension: Secondary | ICD-10-CM | POA: Diagnosis not present

## 2020-01-22 DIAGNOSIS — R079 Chest pain, unspecified: Secondary | ICD-10-CM | POA: Diagnosis not present

## 2020-01-22 DIAGNOSIS — E113293 Type 2 diabetes mellitus with mild nonproliferative diabetic retinopathy without macular edema, bilateral: Secondary | ICD-10-CM | POA: Diagnosis not present

## 2020-01-23 DIAGNOSIS — I10 Essential (primary) hypertension: Secondary | ICD-10-CM | POA: Diagnosis not present

## 2020-01-23 DIAGNOSIS — Z7984 Long term (current) use of oral hypoglycemic drugs: Secondary | ICD-10-CM | POA: Diagnosis not present

## 2020-01-23 DIAGNOSIS — E1165 Type 2 diabetes mellitus with hyperglycemia: Secondary | ICD-10-CM | POA: Diagnosis not present

## 2020-01-23 DIAGNOSIS — Z792 Long term (current) use of antibiotics: Secondary | ICD-10-CM | POA: Diagnosis not present

## 2020-01-23 DIAGNOSIS — B9629 Other Escherichia coli [E. coli] as the cause of diseases classified elsewhere: Secondary | ICD-10-CM | POA: Diagnosis not present

## 2020-01-23 DIAGNOSIS — M5412 Radiculopathy, cervical region: Secondary | ICD-10-CM | POA: Diagnosis not present

## 2020-01-23 DIAGNOSIS — N12 Tubulo-interstitial nephritis, not specified as acute or chronic: Secondary | ICD-10-CM | POA: Diagnosis not present

## 2020-01-23 DIAGNOSIS — Z9181 History of falling: Secondary | ICD-10-CM | POA: Diagnosis not present

## 2020-01-23 DIAGNOSIS — L739 Follicular disorder, unspecified: Secondary | ICD-10-CM | POA: Diagnosis not present

## 2020-01-23 DIAGNOSIS — R42 Dizziness and giddiness: Secondary | ICD-10-CM | POA: Diagnosis not present

## 2020-01-23 DIAGNOSIS — Z4800 Encounter for change or removal of nonsurgical wound dressing: Secondary | ICD-10-CM | POA: Diagnosis not present

## 2020-01-25 DIAGNOSIS — L739 Follicular disorder, unspecified: Secondary | ICD-10-CM | POA: Diagnosis not present

## 2020-01-25 DIAGNOSIS — Z792 Long term (current) use of antibiotics: Secondary | ICD-10-CM | POA: Diagnosis not present

## 2020-01-25 DIAGNOSIS — Z4800 Encounter for change or removal of nonsurgical wound dressing: Secondary | ICD-10-CM | POA: Diagnosis not present

## 2020-01-25 DIAGNOSIS — B9629 Other Escherichia coli [E. coli] as the cause of diseases classified elsewhere: Secondary | ICD-10-CM | POA: Diagnosis not present

## 2020-01-25 DIAGNOSIS — M5412 Radiculopathy, cervical region: Secondary | ICD-10-CM | POA: Diagnosis not present

## 2020-01-25 DIAGNOSIS — I10 Essential (primary) hypertension: Secondary | ICD-10-CM | POA: Diagnosis not present

## 2020-01-25 DIAGNOSIS — N12 Tubulo-interstitial nephritis, not specified as acute or chronic: Secondary | ICD-10-CM | POA: Diagnosis not present

## 2020-01-25 DIAGNOSIS — Z9181 History of falling: Secondary | ICD-10-CM | POA: Diagnosis not present

## 2020-01-25 DIAGNOSIS — Z7984 Long term (current) use of oral hypoglycemic drugs: Secondary | ICD-10-CM | POA: Diagnosis not present

## 2020-01-25 DIAGNOSIS — E1165 Type 2 diabetes mellitus with hyperglycemia: Secondary | ICD-10-CM | POA: Diagnosis not present

## 2020-01-25 DIAGNOSIS — R42 Dizziness and giddiness: Secondary | ICD-10-CM | POA: Diagnosis not present

## 2020-01-27 DIAGNOSIS — R42 Dizziness and giddiness: Secondary | ICD-10-CM | POA: Diagnosis not present

## 2020-01-27 DIAGNOSIS — Z9181 History of falling: Secondary | ICD-10-CM | POA: Diagnosis not present

## 2020-01-27 DIAGNOSIS — I10 Essential (primary) hypertension: Secondary | ICD-10-CM | POA: Diagnosis not present

## 2020-01-27 DIAGNOSIS — Z4800 Encounter for change or removal of nonsurgical wound dressing: Secondary | ICD-10-CM | POA: Diagnosis not present

## 2020-01-27 DIAGNOSIS — E1165 Type 2 diabetes mellitus with hyperglycemia: Secondary | ICD-10-CM | POA: Diagnosis not present

## 2020-01-27 DIAGNOSIS — Z7984 Long term (current) use of oral hypoglycemic drugs: Secondary | ICD-10-CM | POA: Diagnosis not present

## 2020-01-27 DIAGNOSIS — M5412 Radiculopathy, cervical region: Secondary | ICD-10-CM | POA: Diagnosis not present

## 2020-01-27 DIAGNOSIS — N12 Tubulo-interstitial nephritis, not specified as acute or chronic: Secondary | ICD-10-CM | POA: Diagnosis not present

## 2020-01-27 DIAGNOSIS — B9629 Other Escherichia coli [E. coli] as the cause of diseases classified elsewhere: Secondary | ICD-10-CM | POA: Diagnosis not present

## 2020-01-27 DIAGNOSIS — E119 Type 2 diabetes mellitus without complications: Secondary | ICD-10-CM | POA: Diagnosis not present

## 2020-01-27 DIAGNOSIS — Z792 Long term (current) use of antibiotics: Secondary | ICD-10-CM | POA: Diagnosis not present

## 2020-01-27 DIAGNOSIS — L739 Follicular disorder, unspecified: Secondary | ICD-10-CM | POA: Diagnosis not present

## 2020-01-29 DIAGNOSIS — L02211 Cutaneous abscess of abdominal wall: Secondary | ICD-10-CM | POA: Diagnosis not present

## 2020-01-29 DIAGNOSIS — Z872 Personal history of diseases of the skin and subcutaneous tissue: Secondary | ICD-10-CM | POA: Diagnosis not present

## 2020-01-29 DIAGNOSIS — L739 Follicular disorder, unspecified: Secondary | ICD-10-CM | POA: Diagnosis not present

## 2020-01-30 DIAGNOSIS — Z4800 Encounter for change or removal of nonsurgical wound dressing: Secondary | ICD-10-CM | POA: Diagnosis not present

## 2020-02-05 ENCOUNTER — Encounter: Payer: Self-pay | Admitting: *Deleted

## 2020-02-06 ENCOUNTER — Other Ambulatory Visit: Payer: Self-pay

## 2020-02-06 ENCOUNTER — Ambulatory Visit (INDEPENDENT_AMBULATORY_CARE_PROVIDER_SITE_OTHER): Payer: Medicare Other | Admitting: Cardiology

## 2020-02-06 ENCOUNTER — Encounter: Payer: Self-pay | Admitting: Cardiology

## 2020-02-06 VITALS — BP 128/78 | HR 74 | Ht 61.0 in | Wt 253.4 lb

## 2020-02-06 DIAGNOSIS — R0789 Other chest pain: Secondary | ICD-10-CM | POA: Diagnosis not present

## 2020-02-06 NOTE — Progress Notes (Signed)
Clinical Summary Beth Phelps is a 69 y.o.female seen as new consult, referred by Dr Woody Seller for chest pain  1. Chest pain - 06/2010 DSE no ischemia 01/2017 Echo: LVEF 65-70%, benign echo  - 12/2019 [facility] ER visit with chest pain - trops neg. EKG SR, no ischemic changes - CTA negative for PE, +pulm nodule needs repeat CT 6 months - CXR no acute process  - dull pain under breast. 9/10- in severity. No other associated symptoms. Worst with breathing. Continous pain over the weekend several days.  - had similar pain prior but not to that severity - back to her chronic dull pain for the last several years.     Past Medical History:  Diagnosis Date  . Chronic pain   . Diabetes mellitus    type 2  . Fatty liver   . GERD (gastroesophageal reflux disease)   . Herpes   . Hiatal hernia 11/15/2004   small  . History of asthma   . History of sleep apnea   . HTN (hypertension)   . Hyperlipidemia   . Migraine   . Obesity   . Sleep apnea      No Known Allergies   Current Outpatient Medications  Medication Sig Dispense Refill  . esomeprazole (NEXIUM) 40 MG capsule Take 40 mg by mouth daily before breakfast.      . glimepiride (AMARYL) 2 MG tablet Take 2 mg by mouth in the morning and at bedtime.    . hydrochlorothiazide 25 MG tablet Take 25 mg by mouth daily.      . metFORMIN (GLUCOPHAGE) 1000 MG tablet Take 1,000 mg by mouth 2 (two) times daily with a meal.    . metoprolol tartrate (LOPRESSOR) 50 MG tablet Take 50 mg by mouth 2 (two) times daily.    . traMADol (ULTRAM) 50 MG tablet Take by mouth every 6 (six) hours as needed.     No current facility-administered medications for this visit.     Past Surgical History:  Procedure Laterality Date  . ABDOMINAL HYSTERECTOMY    . BLADDER SURGERY    . CHOLECYSTECTOMY    . COLONOSCOPY  09/2012   Dr. Anthony Sar: normal. H/O prior polyps.  . COLONOSCOPY N/A 07/02/2014   Procedure: COLONOSCOPY;  Surgeon: Daneil Dolin, MD;   Location: [facility];  Service: Endoscopy;  Laterality: N/A;  7:30  . ESOPHAGOGASTRODUODENOSCOPY  11/15/2004   Dr. Laural Golden- small sliding hiatal hernia with mild changes of reflux esophagitis  . MOLE REMOVAL     malignant     No Known Allergies    Family History  Problem Relation Age of Onset  . Diabetes Father   . Colon polyps Father   . Heart disease Neg Hx   . Colon cancer Neg Hx      Social History Beth Phelps reports that she quit smoking about 25 years ago. Her smoking use included cigarettes. She has a 20.00 pack-year smoking history. She has never used smokeless tobacco. Beth Phelps reports no history of alcohol use.   Review of Systems CONSTITUTIONAL: No weight loss, fever, chills, weakness or fatigue.  HEENT: Eyes: No visual loss, blurred vision, double vision or yellow sclerae.No hearing loss, sneezing, congestion, runny nose or sore throat.  SKIN: No rash or itching.  CARDIOVASCULAR: per hpi RESPIRATORY: No shortness of breath, cough or sputum.  GASTROINTESTINAL: No anorexia, nausea, vomiting or diarrhea. No abdominal pain or blood.  GENITOURINARY: No burning on urination, no polyuria NEUROLOGICAL:  No headache, dizziness, syncope, paralysis, ataxia, numbness or tingling in the extremities. No change in bowel or bladder control.  MUSCULOSKELETAL: No muscle, back pain, joint pain or stiffness.  LYMPHATICS: No enlarged nodes. No history of splenectomy.  PSYCHIATRIC: No history of depression or anxiety.  ENDOCRINOLOGIC: No reports of sweating, cold or heat intolerance. No polyuria or polydipsia.  Marland Kitchen   Physical Examination Today's Vitals   02/06/20 0845  BP: 128/78  Pulse: 74  Weight: 253 lb 6.4 oz (114.9 kg)  Height: 5\' 1"  (1.549 m)   Body mass index is 47.88 kg/m.  Gen: resting comfortably, no acute distress HEENT: no scleral icterus, pupils equal round and reactive, no palptable cervical adenopathy,  CV: RRR, no m/r/g, no jvd Resp: Clear to auscultation  bilaterally GI: abdomen is soft, non-tender, non-distended, normal bowel sounds, no hepatosplenomegaly MSK: extremities are warm, no edema.  Skin: warm, no rash Neuro:  no focal deficits Psych: appropriate affect    Assessment and Plan  1. Chest pain - atypical chest pain - several years of right sided dull pain that can last days at a time. Time of ER visit this pain had been more intense then her chronic symptoms - negative workup by EKG and troponins, CT PE was negative - symptoms not consistent with cardiac ischemia, no plans for ischemic testing. Monitor at this time - if considered ischemic testing in the future would favor coronary CTA due to her body habitus.       Arnoldo Lenis, M.D.

## 2020-02-06 NOTE — Patient Instructions (Signed)

## 2020-02-14 DIAGNOSIS — E1165 Type 2 diabetes mellitus with hyperglycemia: Secondary | ICD-10-CM | POA: Diagnosis not present

## 2020-02-18 DIAGNOSIS — S46811A Strain of other muscles, fascia and tendons at shoulder and upper arm level, right arm, initial encounter: Secondary | ICD-10-CM | POA: Diagnosis not present

## 2020-02-18 DIAGNOSIS — M542 Cervicalgia: Secondary | ICD-10-CM | POA: Diagnosis not present

## 2020-02-18 DIAGNOSIS — Z79899 Other long term (current) drug therapy: Secondary | ICD-10-CM | POA: Diagnosis not present

## 2020-02-18 DIAGNOSIS — K219 Gastro-esophageal reflux disease without esophagitis: Secondary | ICD-10-CM | POA: Diagnosis not present

## 2020-02-18 DIAGNOSIS — Z7984 Long term (current) use of oral hypoglycemic drugs: Secondary | ICD-10-CM | POA: Diagnosis not present

## 2020-02-18 DIAGNOSIS — X58XXXA Exposure to other specified factors, initial encounter: Secondary | ICD-10-CM | POA: Diagnosis not present

## 2020-02-18 DIAGNOSIS — R519 Headache, unspecified: Secondary | ICD-10-CM | POA: Diagnosis not present

## 2020-02-18 DIAGNOSIS — Z87891 Personal history of nicotine dependence: Secondary | ICD-10-CM | POA: Diagnosis not present

## 2020-02-18 DIAGNOSIS — S46812A Strain of other muscles, fascia and tendons at shoulder and upper arm level, left arm, initial encounter: Secondary | ICD-10-CM | POA: Diagnosis not present

## 2020-02-18 DIAGNOSIS — D1779 Benign lipomatous neoplasm of other sites: Secondary | ICD-10-CM | POA: Diagnosis not present

## 2020-02-18 DIAGNOSIS — M4802 Spinal stenosis, cervical region: Secondary | ICD-10-CM | POA: Diagnosis not present

## 2020-02-18 DIAGNOSIS — I10 Essential (primary) hypertension: Secondary | ICD-10-CM | POA: Diagnosis not present

## 2020-02-18 DIAGNOSIS — E119 Type 2 diabetes mellitus without complications: Secondary | ICD-10-CM | POA: Diagnosis not present

## 2020-02-18 DIAGNOSIS — Z7982 Long term (current) use of aspirin: Secondary | ICD-10-CM | POA: Diagnosis not present

## 2020-02-27 DIAGNOSIS — E1165 Type 2 diabetes mellitus with hyperglycemia: Secondary | ICD-10-CM | POA: Diagnosis not present

## 2020-02-27 DIAGNOSIS — Z299 Encounter for prophylactic measures, unspecified: Secondary | ICD-10-CM | POA: Diagnosis not present

## 2020-02-27 DIAGNOSIS — J340 Abscess, furuncle and carbuncle of nose: Secondary | ICD-10-CM | POA: Diagnosis not present

## 2020-02-27 DIAGNOSIS — I10 Essential (primary) hypertension: Secondary | ICD-10-CM | POA: Diagnosis not present

## 2020-02-27 DIAGNOSIS — C44301 Unspecified malignant neoplasm of skin of nose: Secondary | ICD-10-CM | POA: Diagnosis not present

## 2020-03-15 DIAGNOSIS — E1165 Type 2 diabetes mellitus with hyperglycemia: Secondary | ICD-10-CM | POA: Diagnosis not present

## 2020-03-17 DIAGNOSIS — E113293 Type 2 diabetes mellitus with mild nonproliferative diabetic retinopathy without macular edema, bilateral: Secondary | ICD-10-CM | POA: Diagnosis not present

## 2020-03-17 DIAGNOSIS — I10 Essential (primary) hypertension: Secondary | ICD-10-CM | POA: Diagnosis not present

## 2020-03-17 DIAGNOSIS — M79672 Pain in left foot: Secondary | ICD-10-CM | POA: Diagnosis not present

## 2020-03-17 DIAGNOSIS — Z299 Encounter for prophylactic measures, unspecified: Secondary | ICD-10-CM | POA: Diagnosis not present

## 2020-03-17 DIAGNOSIS — E1165 Type 2 diabetes mellitus with hyperglycemia: Secondary | ICD-10-CM | POA: Diagnosis not present

## 2020-03-20 DIAGNOSIS — E1165 Type 2 diabetes mellitus with hyperglycemia: Secondary | ICD-10-CM | POA: Diagnosis not present

## 2020-03-20 DIAGNOSIS — M21611 Bunion of right foot: Secondary | ICD-10-CM | POA: Diagnosis not present

## 2020-03-20 DIAGNOSIS — Z299 Encounter for prophylactic measures, unspecified: Secondary | ICD-10-CM | POA: Diagnosis not present

## 2020-03-20 DIAGNOSIS — I10 Essential (primary) hypertension: Secondary | ICD-10-CM | POA: Diagnosis not present

## 2020-03-20 DIAGNOSIS — E113293 Type 2 diabetes mellitus with mild nonproliferative diabetic retinopathy without macular edema, bilateral: Secondary | ICD-10-CM | POA: Diagnosis not present

## 2020-03-20 DIAGNOSIS — E1142 Type 2 diabetes mellitus with diabetic polyneuropathy: Secondary | ICD-10-CM | POA: Diagnosis not present

## 2020-03-24 DIAGNOSIS — E1165 Type 2 diabetes mellitus with hyperglycemia: Secondary | ICD-10-CM | POA: Diagnosis not present

## 2020-03-24 DIAGNOSIS — Z299 Encounter for prophylactic measures, unspecified: Secondary | ICD-10-CM | POA: Diagnosis not present

## 2020-03-24 DIAGNOSIS — L02419 Cutaneous abscess of limb, unspecified: Secondary | ICD-10-CM | POA: Diagnosis not present

## 2020-03-24 DIAGNOSIS — E113293 Type 2 diabetes mellitus with mild nonproliferative diabetic retinopathy without macular edema, bilateral: Secondary | ICD-10-CM | POA: Diagnosis not present

## 2020-03-24 DIAGNOSIS — I10 Essential (primary) hypertension: Secondary | ICD-10-CM | POA: Diagnosis not present

## 2020-03-30 DIAGNOSIS — E1165 Type 2 diabetes mellitus with hyperglycemia: Secondary | ICD-10-CM | POA: Diagnosis not present

## 2020-03-30 DIAGNOSIS — Z299 Encounter for prophylactic measures, unspecified: Secondary | ICD-10-CM | POA: Diagnosis not present

## 2020-03-30 DIAGNOSIS — L0293 Carbuncle, unspecified: Secondary | ICD-10-CM | POA: Diagnosis not present

## 2020-03-30 DIAGNOSIS — I10 Essential (primary) hypertension: Secondary | ICD-10-CM | POA: Diagnosis not present

## 2020-03-30 DIAGNOSIS — E113293 Type 2 diabetes mellitus with mild nonproliferative diabetic retinopathy without macular edema, bilateral: Secondary | ICD-10-CM | POA: Diagnosis not present

## 2020-03-30 DIAGNOSIS — L02211 Cutaneous abscess of abdominal wall: Secondary | ICD-10-CM | POA: Diagnosis not present

## 2020-04-01 DIAGNOSIS — I10 Essential (primary) hypertension: Secondary | ICD-10-CM | POA: Diagnosis not present

## 2020-04-01 DIAGNOSIS — E119 Type 2 diabetes mellitus without complications: Secondary | ICD-10-CM | POA: Diagnosis not present

## 2020-04-01 DIAGNOSIS — Z20822 Contact with and (suspected) exposure to covid-19: Secondary | ICD-10-CM | POA: Diagnosis not present

## 2020-04-01 DIAGNOSIS — T8130XA Disruption of wound, unspecified, initial encounter: Secondary | ICD-10-CM | POA: Diagnosis not present

## 2020-04-01 DIAGNOSIS — N201 Calculus of ureter: Secondary | ICD-10-CM | POA: Diagnosis not present

## 2020-04-01 DIAGNOSIS — E785 Hyperlipidemia, unspecified: Secondary | ICD-10-CM | POA: Diagnosis not present

## 2020-04-01 DIAGNOSIS — Z7984 Long term (current) use of oral hypoglycemic drugs: Secondary | ICD-10-CM | POA: Diagnosis not present

## 2020-04-01 DIAGNOSIS — B9562 Methicillin resistant Staphylococcus aureus infection as the cause of diseases classified elsewhere: Secondary | ICD-10-CM | POA: Diagnosis not present

## 2020-04-01 DIAGNOSIS — Z7982 Long term (current) use of aspirin: Secondary | ICD-10-CM | POA: Diagnosis not present

## 2020-04-01 DIAGNOSIS — L02211 Cutaneous abscess of abdominal wall: Secondary | ICD-10-CM | POA: Diagnosis not present

## 2020-04-01 DIAGNOSIS — L03311 Cellulitis of abdominal wall: Secondary | ICD-10-CM | POA: Insufficient documentation

## 2020-04-01 DIAGNOSIS — N132 Hydronephrosis with renal and ureteral calculous obstruction: Secondary | ICD-10-CM | POA: Diagnosis not present

## 2020-04-01 DIAGNOSIS — Z9049 Acquired absence of other specified parts of digestive tract: Secondary | ICD-10-CM | POA: Diagnosis not present

## 2020-04-01 DIAGNOSIS — Z87891 Personal history of nicotine dependence: Secondary | ICD-10-CM | POA: Diagnosis not present

## 2020-04-02 DIAGNOSIS — N201 Calculus of ureter: Secondary | ICD-10-CM | POA: Insufficient documentation

## 2020-04-02 DIAGNOSIS — L03311 Cellulitis of abdominal wall: Secondary | ICD-10-CM | POA: Diagnosis not present

## 2020-04-04 DIAGNOSIS — Z7984 Long term (current) use of oral hypoglycemic drugs: Secondary | ICD-10-CM | POA: Diagnosis not present

## 2020-04-04 DIAGNOSIS — Z7982 Long term (current) use of aspirin: Secondary | ICD-10-CM | POA: Diagnosis not present

## 2020-04-04 DIAGNOSIS — Z87891 Personal history of nicotine dependence: Secondary | ICD-10-CM | POA: Diagnosis not present

## 2020-04-04 DIAGNOSIS — I10 Essential (primary) hypertension: Secondary | ICD-10-CM | POA: Diagnosis not present

## 2020-04-04 DIAGNOSIS — L03311 Cellulitis of abdominal wall: Secondary | ICD-10-CM | POA: Diagnosis not present

## 2020-04-04 DIAGNOSIS — K760 Fatty (change of) liver, not elsewhere classified: Secondary | ICD-10-CM | POA: Diagnosis not present

## 2020-04-04 DIAGNOSIS — D1801 Hemangioma of skin and subcutaneous tissue: Secondary | ICD-10-CM | POA: Diagnosis not present

## 2020-04-04 DIAGNOSIS — N201 Calculus of ureter: Secondary | ICD-10-CM | POA: Diagnosis not present

## 2020-04-04 DIAGNOSIS — Z9049 Acquired absence of other specified parts of digestive tract: Secondary | ICD-10-CM | POA: Diagnosis not present

## 2020-04-04 DIAGNOSIS — B9562 Methicillin resistant Staphylococcus aureus infection as the cause of diseases classified elsewhere: Secondary | ICD-10-CM | POA: Diagnosis not present

## 2020-04-04 DIAGNOSIS — E860 Dehydration: Secondary | ICD-10-CM | POA: Diagnosis not present

## 2020-04-04 DIAGNOSIS — R112 Nausea with vomiting, unspecified: Secondary | ICD-10-CM | POA: Diagnosis not present

## 2020-04-04 DIAGNOSIS — E119 Type 2 diabetes mellitus without complications: Secondary | ICD-10-CM | POA: Diagnosis not present

## 2020-04-04 DIAGNOSIS — Z20822 Contact with and (suspected) exposure to covid-19: Secondary | ICD-10-CM | POA: Diagnosis not present

## 2020-04-04 DIAGNOSIS — G4730 Sleep apnea, unspecified: Secondary | ICD-10-CM | POA: Diagnosis not present

## 2020-04-04 DIAGNOSIS — R58 Hemorrhage, not elsewhere classified: Secondary | ICD-10-CM | POA: Diagnosis not present

## 2020-04-04 DIAGNOSIS — Z79899 Other long term (current) drug therapy: Secondary | ICD-10-CM | POA: Diagnosis not present

## 2020-04-04 DIAGNOSIS — L02211 Cutaneous abscess of abdominal wall: Secondary | ICD-10-CM | POA: Diagnosis not present

## 2020-04-04 DIAGNOSIS — K921 Melena: Secondary | ICD-10-CM | POA: Diagnosis not present

## 2020-04-04 DIAGNOSIS — E785 Hyperlipidemia, unspecified: Secondary | ICD-10-CM | POA: Diagnosis not present

## 2020-04-04 DIAGNOSIS — L739 Follicular disorder, unspecified: Secondary | ICD-10-CM | POA: Diagnosis not present

## 2020-04-04 DIAGNOSIS — M1990 Unspecified osteoarthritis, unspecified site: Secondary | ICD-10-CM | POA: Diagnosis not present

## 2020-04-04 DIAGNOSIS — Z743 Need for continuous supervision: Secondary | ICD-10-CM | POA: Diagnosis not present

## 2020-04-04 DIAGNOSIS — K529 Noninfective gastroenteritis and colitis, unspecified: Secondary | ICD-10-CM | POA: Diagnosis not present

## 2020-04-04 DIAGNOSIS — R109 Unspecified abdominal pain: Secondary | ICD-10-CM | POA: Diagnosis not present

## 2020-04-04 DIAGNOSIS — R262 Difficulty in walking, not elsewhere classified: Secondary | ICD-10-CM | POA: Diagnosis not present

## 2020-04-05 DIAGNOSIS — K921 Melena: Secondary | ICD-10-CM | POA: Insufficient documentation

## 2020-04-05 DIAGNOSIS — K529 Noninfective gastroenteritis and colitis, unspecified: Secondary | ICD-10-CM | POA: Diagnosis not present

## 2020-04-05 DIAGNOSIS — R112 Nausea with vomiting, unspecified: Secondary | ICD-10-CM | POA: Diagnosis not present

## 2020-04-05 DIAGNOSIS — R197 Diarrhea, unspecified: Secondary | ICD-10-CM | POA: Diagnosis not present

## 2020-04-05 DIAGNOSIS — K922 Gastrointestinal hemorrhage, unspecified: Secondary | ICD-10-CM | POA: Diagnosis not present

## 2020-04-05 DIAGNOSIS — E860 Dehydration: Secondary | ICD-10-CM | POA: Diagnosis not present

## 2020-04-06 DIAGNOSIS — R197 Diarrhea, unspecified: Secondary | ICD-10-CM | POA: Diagnosis not present

## 2020-04-06 DIAGNOSIS — E860 Dehydration: Secondary | ICD-10-CM | POA: Diagnosis not present

## 2020-04-06 DIAGNOSIS — K529 Noninfective gastroenteritis and colitis, unspecified: Secondary | ICD-10-CM | POA: Diagnosis not present

## 2020-04-06 DIAGNOSIS — R112 Nausea with vomiting, unspecified: Secondary | ICD-10-CM | POA: Diagnosis not present

## 2020-04-07 DIAGNOSIS — K529 Noninfective gastroenteritis and colitis, unspecified: Secondary | ICD-10-CM | POA: Diagnosis not present

## 2020-04-07 DIAGNOSIS — E860 Dehydration: Secondary | ICD-10-CM | POA: Diagnosis not present

## 2020-04-07 DIAGNOSIS — R112 Nausea with vomiting, unspecified: Secondary | ICD-10-CM | POA: Diagnosis not present

## 2020-04-07 DIAGNOSIS — R197 Diarrhea, unspecified: Secondary | ICD-10-CM | POA: Diagnosis not present

## 2020-04-07 DIAGNOSIS — K922 Gastrointestinal hemorrhage, unspecified: Secondary | ICD-10-CM | POA: Diagnosis not present

## 2020-04-08 DIAGNOSIS — K529 Noninfective gastroenteritis and colitis, unspecified: Secondary | ICD-10-CM | POA: Diagnosis not present

## 2020-04-08 DIAGNOSIS — R112 Nausea with vomiting, unspecified: Secondary | ICD-10-CM | POA: Diagnosis not present

## 2020-04-08 DIAGNOSIS — E860 Dehydration: Secondary | ICD-10-CM | POA: Diagnosis not present

## 2020-04-08 DIAGNOSIS — R197 Diarrhea, unspecified: Secondary | ICD-10-CM | POA: Diagnosis not present

## 2020-04-14 DIAGNOSIS — E113293 Type 2 diabetes mellitus with mild nonproliferative diabetic retinopathy without macular edema, bilateral: Secondary | ICD-10-CM | POA: Diagnosis not present

## 2020-04-14 DIAGNOSIS — I10 Essential (primary) hypertension: Secondary | ICD-10-CM | POA: Diagnosis not present

## 2020-04-14 DIAGNOSIS — Z299 Encounter for prophylactic measures, unspecified: Secondary | ICD-10-CM | POA: Diagnosis not present

## 2020-04-14 DIAGNOSIS — A09 Infectious gastroenteritis and colitis, unspecified: Secondary | ICD-10-CM | POA: Diagnosis not present

## 2020-04-14 DIAGNOSIS — Z87891 Personal history of nicotine dependence: Secondary | ICD-10-CM | POA: Diagnosis not present

## 2020-04-14 DIAGNOSIS — N1831 Chronic kidney disease, stage 3a: Secondary | ICD-10-CM | POA: Diagnosis not present

## 2020-04-14 DIAGNOSIS — E1165 Type 2 diabetes mellitus with hyperglycemia: Secondary | ICD-10-CM | POA: Diagnosis not present

## 2020-04-27 ENCOUNTER — Ambulatory Visit (INDEPENDENT_AMBULATORY_CARE_PROVIDER_SITE_OTHER): Payer: Medicare Other

## 2020-04-27 ENCOUNTER — Ambulatory Visit: Payer: Medicare Other | Admitting: Podiatry

## 2020-04-27 ENCOUNTER — Other Ambulatory Visit: Payer: Self-pay

## 2020-04-27 DIAGNOSIS — M21612 Bunion of left foot: Secondary | ICD-10-CM | POA: Diagnosis not present

## 2020-04-27 DIAGNOSIS — M79672 Pain in left foot: Secondary | ICD-10-CM

## 2020-04-27 DIAGNOSIS — G8929 Other chronic pain: Secondary | ICD-10-CM

## 2020-04-27 DIAGNOSIS — M21619 Bunion of unspecified foot: Secondary | ICD-10-CM

## 2020-04-28 NOTE — Progress Notes (Signed)
   Subjective: 69 y.o. female presents today as a new patient for evaluation of a symptomatic bunion to the left foot.  Patient states that she has had the bunion deformity since she was a teenager in high school.  Over the past several years the bunion has progressed and become very symptomatic.  She is unable to use most shoes because they irritate the bunion site despite changing shoe gear.  She would like to have the bunion further evaluated.  She also soaks her foot daily to help alleviate some of the pain associated to her bunion deformity.   Past Medical History:  Diagnosis Date  . Chronic pain   . Diabetes mellitus    type 2  . Fatty liver   . GERD (gastroesophageal reflux disease)   . Herpes   . Hiatal hernia 11/15/2004   small  . History of asthma   . History of sleep apnea   . HTN (hypertension)   . Hyperlipidemia   . Migraine   . Obesity   . Sleep apnea       Objective: Physical Exam General: The patient is alert and oriented x3 in no acute distress.  Dermatology: Skin is cool, dry and supple bilateral lower extremities. Negative for open lesions or macerations.  Vascular: Palpable pedal pulses bilaterally. No edema or erythema noted. Capillary refill within normal limits.  Neurological: Epicritic and protective threshold grossly intact bilaterally.   Musculoskeletal Exam: Clinical evidence of bunion deformity noted to the respective foot. There is moderate pain on palpation range of motion of the first MPJ. Lateral deviation of the hallux noted consistent with hallux abductovalgus.  Radiographic Exam: Increased intermetatarsal angle greater than 15 with a hallux abductus angle greater than 30 noted on AP view. Moderate degenerative changes noted within the first MPJ.  Assessment: 1. HAV w/ bunion deformity left 2.  Diabetes mellitus-uncomplicated, controlled   Plan of Care:  1. Patient was evaluated. X-Rays reviewed. 2. Today we discussed the conservative  versus surgical management of the presenting pathology. The patient opts for surgical management. All possible complications and details of the procedure were explained. All patient questions were answered. No guarantees were expressed or implied. 3. Authorization for surgery was initiated today. Surgery will consist of bunionectomy with metatarsal osteotomy left foot 4.  Short CAM boot dispensed 5.  Return to clinic 1 week postop        Edrick Kins, DPM [facility]  Dr. Edrick Kins, [address]                                        [place], [place] [address]                Office [phone]  Fax [phone]

## 2020-05-06 ENCOUNTER — Telehealth: Payer: Self-pay

## 2020-05-06 NOTE — Telephone Encounter (Signed)
DOS 05/21/2020  [place] BUNIONECTOMY LT - 69861  [employer] EFFECTIVE DATE - 10/18/2019  CO-INSURANCE 0% / Day OUTPATIENT SURGERY 0% / [place] $195 / Day OUTPATIENT SURGERY $195 / [place]  Tracking #: [id number]  Message: [id number]: Procedures performed at an ambulatory surgery center do not require prior authorization.

## 2020-05-15 DIAGNOSIS — E1165 Type 2 diabetes mellitus with hyperglycemia: Secondary | ICD-10-CM | POA: Diagnosis not present

## 2020-05-21 ENCOUNTER — Other Ambulatory Visit: Payer: Self-pay | Admitting: Podiatry

## 2020-05-21 DIAGNOSIS — M2012 Hallux valgus (acquired), left foot: Secondary | ICD-10-CM

## 2020-05-21 DIAGNOSIS — I10 Essential (primary) hypertension: Secondary | ICD-10-CM | POA: Diagnosis not present

## 2020-05-21 MED ORDER — MELOXICAM 15 MG PO TABS: 15.0000 mg | ORAL_TABLET | Freq: Every day | ORAL | 1 refills | Status: DC

## 2020-05-21 MED ORDER — OXYCODONE-ACETAMINOPHEN 5-325 MG PO TABS: 1.0000 | ORAL_TABLET | ORAL | 0 refills | Status: DC | PRN

## 2020-05-21 NOTE — Progress Notes (Signed)
PRN psotop 

## 2020-05-27 ENCOUNTER — Ambulatory Visit (INDEPENDENT_AMBULATORY_CARE_PROVIDER_SITE_OTHER): Payer: Medicare Other | Admitting: Podiatry

## 2020-05-27 ENCOUNTER — Ambulatory Visit: Payer: Self-pay

## 2020-05-27 ENCOUNTER — Other Ambulatory Visit: Payer: Self-pay

## 2020-05-27 DIAGNOSIS — Z9889 Other specified postprocedural states: Secondary | ICD-10-CM

## 2020-05-27 DIAGNOSIS — M21612 Bunion of left foot: Secondary | ICD-10-CM

## 2020-05-27 NOTE — Progress Notes (Signed)
   Subjective:  Patient presents today status post bunionectomy with first metatarsal osteotomy left foot. DOS: 05/21/2020.  Patient states she is doing very well.  She does have some moderate pain that is controlled with her pain medication.  She has been minimally weightbearing in the cam boot with the assistance of a cane.  She is also kept the dressings clean dry and intact.  She presents for further treatment evaluation.  No new complaints at this time  Past Medical History:  Diagnosis Date  . Chronic pain   . Diabetes mellitus    type 2  . Fatty liver   . GERD (gastroesophageal reflux disease)   . Herpes   . Hiatal hernia 11/15/2004   small  . History of asthma   . History of sleep apnea   . HTN (hypertension)   . Hyperlipidemia   . Migraine   . Obesity   . Sleep apnea       Objective/Physical Exam Neurovascular status intact.  Skin incisions appear to be well coapted with  staples intact. No sign of infectious process noted. No dehiscence. No active bleeding noted. Moderate edema noted to the surgical extremity.  Radiographic Exam:  Orthopedic hardware and osteotomies sites appear to be stable with routine healing.  Assessment: 1. s/p bunionectomy with first metatarsal osteotomy left foot. DOS: 05/21/2020   Plan of Care:  1. Patient was evaluated. X-rays reviewed 2.  Dressings changed today.  Keep clean dry and intact x1 week 3.  Continue minimal weightbearing in the cam boot with the assistance of a cane 4.  Continue Percocet 5/325 mg as needed pain 5.  Return to clinic in 1 week for possible staple removal   Edrick Kins, DPM [facility]  Dr. Edrick Kins, [address]                                        [place], [place] [address]                Office [phone]  Fax [phone]

## 2020-05-28 ENCOUNTER — Telehealth: Payer: Self-pay | Admitting: Podiatry

## 2020-05-28 NOTE — Telephone Encounter (Signed)
Pt was seen in office yesterday and is calling back to request a refill of Oxycodone. She got home and realized she only had a couple of pills left.

## 2020-05-29 ENCOUNTER — Telehealth: Payer: Self-pay | Admitting: Podiatry

## 2020-05-29 ENCOUNTER — Other Ambulatory Visit: Payer: Self-pay | Admitting: Podiatry

## 2020-05-29 MED ORDER — OXYCODONE-ACETAMINOPHEN 5-325 MG PO TABS: 1.0000 | ORAL_TABLET | ORAL | 0 refills | Status: DC | PRN

## 2020-05-29 NOTE — Progress Notes (Signed)
PRN postop 

## 2020-05-29 NOTE — Telephone Encounter (Signed)
Pt is requesting a refill on her pain medication

## 2020-06-03 ENCOUNTER — Other Ambulatory Visit: Payer: Self-pay

## 2020-06-03 ENCOUNTER — Ambulatory Visit (INDEPENDENT_AMBULATORY_CARE_PROVIDER_SITE_OTHER): Payer: Medicare Other | Admitting: Podiatry

## 2020-06-03 DIAGNOSIS — M21612 Bunion of left foot: Secondary | ICD-10-CM

## 2020-06-03 DIAGNOSIS — Z9889 Other specified postprocedural states: Secondary | ICD-10-CM

## 2020-06-03 NOTE — Progress Notes (Signed)
   Subjective:  Patient presents today status post bunionectomy with first metatarsal osteotomy left foot. DOS: 05/21/2020.  Patient is doing very well.  She has been weightbearing in the cam boot as instructed.  No new complaints at this time.  Past Medical History:  Diagnosis Date  . Chronic pain   . Diabetes mellitus    type 2  . Fatty liver   . GERD (gastroesophageal reflux disease)   . Herpes   . Hiatal hernia 11/15/2004   small  . History of asthma   . History of sleep apnea   . HTN (hypertension)   . Hyperlipidemia   . Migraine   . Obesity   . Sleep apnea       Objective/Physical Exam Neurovascular status intact.  Skin incisions appear to be well coapted with  staples intact. No sign of infectious process noted. No dehiscence. No active bleeding noted. Moderate edema noted to the surgical extremity.  Assessment: 1. s/p bunionectomy with first metatarsal osteotomy left foot. DOS: 05/21/2020   Plan of Care:  1. Patient was evaluated.  Today staples were removed with the exception of approximately 4-5 staples left to the central portion of the incision site.  We will leave those in for 1 additional week. 2.  Patient may begin washing and showering and getting the foot wet.  Recommend drying and applying antibiotic ointment to the incision site daily and over the remaining staples 3.  Recommend Ace wrap daily.  Ace wraps provided 4.  Discontinue cam boot.  Postsurgical shoe dispensed.  Minimal weightbearing as tolerated 5.  Return to clinic in 2 weeks for staple removal  Edrick Kins, DPM [facility]  Dr. Edrick Kins, [address]                                        [place], [place] [address]                Office [phone]  Fax [phone]

## 2020-06-10 ENCOUNTER — Telehealth: Payer: Self-pay | Admitting: Podiatry

## 2020-06-10 NOTE — Telephone Encounter (Signed)
Pain medication refill request

## 2020-06-16 DIAGNOSIS — E1165 Type 2 diabetes mellitus with hyperglycemia: Secondary | ICD-10-CM | POA: Diagnosis not present

## 2020-06-17 ENCOUNTER — Ambulatory Visit (INDEPENDENT_AMBULATORY_CARE_PROVIDER_SITE_OTHER): Payer: Medicare Other | Admitting: Podiatry

## 2020-06-17 ENCOUNTER — Other Ambulatory Visit: Payer: Self-pay

## 2020-06-17 ENCOUNTER — Encounter: Payer: Self-pay | Admitting: Podiatry

## 2020-06-17 DIAGNOSIS — Z9889 Other specified postprocedural states: Secondary | ICD-10-CM

## 2020-06-17 DIAGNOSIS — M21612 Bunion of left foot: Secondary | ICD-10-CM

## 2020-06-17 NOTE — Progress Notes (Signed)
  Subjective:  Patient ID: Beth Phelps, female    DOB: Aug 23, 1951,  MRN: [id number]  Chief Complaint  Patient presents with  . Routine Post Op    i am doing better on the left foot and i am hoping the stitches are coming out today    DOS: 05/21/20 Procedure: left foot chevron bunionectomy  69 y.o. female returns for post-op check. Here for staple removal today  Review of Systems: Negative except as noted in the HPI. Denies N/V/F/Ch.   Objective:  There were no vitals filed for this visit. There is no height or weight on file to calculate BMI. Constitutional Well developed. Well nourished.  Vascular Foot warm and well perfused. Capillary refill normal to all digits.   Neurologic Normal speech. Oriented to person, place, and time. Epicritic sensation to light touch grossly present bilaterally.  Dermatologic Skin healing well without signs of infection. Skin edges well coapted without signs of infection. Small area of superficial gapping with staple removal, reinforced with steri strips  Orthopedic: Tenderness to palpation noted about the surgical site.    Assessment:   1. Bunion, left foot   2. Status post left foot surgery    Plan:  Patient was evaluated and treated and all questions answered.  S/p foot surgery left -Progressing as expected post-operatively. -WB Status: WBAT in surgical shoe -Sutures: staples removed. New Steri strips applied. Keep dry until next visit -Foot redressed.  Return in about 2 weeks (around 07/01/2020) for new x-ray.

## 2020-07-02 ENCOUNTER — Ambulatory Visit (INDEPENDENT_AMBULATORY_CARE_PROVIDER_SITE_OTHER): Payer: Medicare Other

## 2020-07-02 ENCOUNTER — Ambulatory Visit (INDEPENDENT_AMBULATORY_CARE_PROVIDER_SITE_OTHER): Payer: Medicare Other | Admitting: Podiatry

## 2020-07-02 ENCOUNTER — Other Ambulatory Visit: Payer: Self-pay

## 2020-07-02 DIAGNOSIS — M21612 Bunion of left foot: Secondary | ICD-10-CM

## 2020-07-04 NOTE — Progress Notes (Signed)
  Subjective:  Patient ID: Beth Phelps, female    DOB: 01/27/1951,  MRN: [id number]  Chief Complaint  Patient presents with  . Bunions    left foot pain, 2 week follow up    DOS: 05/21/20 Procedure: left foot chevron bunionectomy  69 y.o. female returns for post-op check.  She feels well, minimal pain that is mostly centered about the incision.  She is been using Neosporin lidocaine which is been helpful.  Review of Systems: Negative except as noted in the HPI. Denies N/V/F/Ch.   Objective:  There were no vitals filed for this visit. There is no height or weight on file to calculate BMI. Constitutional Well developed. Well nourished.  Vascular Foot warm and well perfused. Capillary refill normal to all digits.   Neurologic Normal speech. Oriented to person, place, and time. Epicritic sensation to light touch grossly present bilaterally.  Dermatologic  incision is well-healed.  Slightly tender to touch.  Orthopedic: Tenderness to palpation noted about the surgical site.   Radiographs 3 weightbearing views left foot: Good osseous bridging of osteotomy site Assessment:   1. Bunion, left foot    Plan:  Patient was evaluated and treated and all questions answered.  S/p foot surgery left -Progressing as expected post-operatively. -WB Status: WBAT in regular shoe gear -Sutures: Removed previously  Return in about 1 month (around 08/01/2020) for with Dr Amalia Hailey.

## 2020-07-16 DIAGNOSIS — E1165 Type 2 diabetes mellitus with hyperglycemia: Secondary | ICD-10-CM | POA: Diagnosis not present

## 2020-07-17 DIAGNOSIS — N649 Disorder of breast, unspecified: Secondary | ICD-10-CM | POA: Diagnosis not present

## 2020-07-17 DIAGNOSIS — N6452 Nipple discharge: Secondary | ICD-10-CM | POA: Diagnosis not present

## 2020-07-24 DIAGNOSIS — N6452 Nipple discharge: Secondary | ICD-10-CM | POA: Diagnosis not present

## 2020-07-30 DIAGNOSIS — Z23 Encounter for immunization: Secondary | ICD-10-CM | POA: Diagnosis not present

## 2020-07-30 DIAGNOSIS — E113293 Type 2 diabetes mellitus with mild nonproliferative diabetic retinopathy without macular edema, bilateral: Secondary | ICD-10-CM | POA: Diagnosis not present

## 2020-07-30 DIAGNOSIS — E1165 Type 2 diabetes mellitus with hyperglycemia: Secondary | ICD-10-CM | POA: Diagnosis not present

## 2020-07-30 DIAGNOSIS — Z299 Encounter for prophylactic measures, unspecified: Secondary | ICD-10-CM | POA: Diagnosis not present

## 2020-07-30 DIAGNOSIS — E1142 Type 2 diabetes mellitus with diabetic polyneuropathy: Secondary | ICD-10-CM | POA: Diagnosis not present

## 2020-07-30 DIAGNOSIS — I10 Essential (primary) hypertension: Secondary | ICD-10-CM | POA: Diagnosis not present

## 2020-08-05 ENCOUNTER — Ambulatory Visit: Payer: Medicare Other | Admitting: Podiatry

## 2020-08-14 ENCOUNTER — Ambulatory Visit: Payer: Medicare Other | Admitting: Cardiology

## 2020-08-14 DIAGNOSIS — I209 Angina pectoris, unspecified: Secondary | ICD-10-CM | POA: Diagnosis not present

## 2020-08-14 DIAGNOSIS — E1165 Type 2 diabetes mellitus with hyperglycemia: Secondary | ICD-10-CM | POA: Diagnosis not present

## 2020-08-15 DIAGNOSIS — E1165 Type 2 diabetes mellitus with hyperglycemia: Secondary | ICD-10-CM | POA: Diagnosis not present

## 2020-08-19 DIAGNOSIS — N6452 Nipple discharge: Secondary | ICD-10-CM | POA: Diagnosis not present

## 2020-08-19 DIAGNOSIS — N6311 Unspecified lump in the right breast, upper outer quadrant: Secondary | ICD-10-CM | POA: Diagnosis not present

## 2020-08-19 DIAGNOSIS — R928 Other abnormal and inconclusive findings on diagnostic imaging of breast: Secondary | ICD-10-CM | POA: Diagnosis not present

## 2020-08-19 DIAGNOSIS — N6312 Unspecified lump in the right breast, upper inner quadrant: Secondary | ICD-10-CM | POA: Diagnosis not present

## 2020-09-09 DIAGNOSIS — C50411 Malignant neoplasm of upper-outer quadrant of right female breast: Secondary | ICD-10-CM | POA: Diagnosis not present

## 2020-09-09 DIAGNOSIS — Z170 Estrogen receptor positive status [ER+]: Secondary | ICD-10-CM | POA: Diagnosis not present

## 2020-09-09 DIAGNOSIS — N6312 Unspecified lump in the right breast, upper inner quadrant: Secondary | ICD-10-CM | POA: Diagnosis not present

## 2020-09-09 DIAGNOSIS — N6311 Unspecified lump in the right breast, upper outer quadrant: Secondary | ICD-10-CM | POA: Diagnosis not present

## 2020-09-09 DIAGNOSIS — C50211 Malignant neoplasm of upper-inner quadrant of right female breast: Secondary | ICD-10-CM | POA: Diagnosis not present

## 2020-09-15 DIAGNOSIS — I209 Angina pectoris, unspecified: Secondary | ICD-10-CM | POA: Diagnosis not present

## 2020-09-15 DIAGNOSIS — E1165 Type 2 diabetes mellitus with hyperglycemia: Secondary | ICD-10-CM | POA: Diagnosis not present

## 2020-09-16 DIAGNOSIS — C50911 Malignant neoplasm of unspecified site of right female breast: Secondary | ICD-10-CM | POA: Diagnosis not present

## 2020-09-17 DIAGNOSIS — Z7189 Other specified counseling: Secondary | ICD-10-CM | POA: Diagnosis not present

## 2020-09-17 DIAGNOSIS — R928 Other abnormal and inconclusive findings on diagnostic imaging of breast: Secondary | ICD-10-CM | POA: Diagnosis not present

## 2020-09-17 DIAGNOSIS — C50811 Malignant neoplasm of overlapping sites of right female breast: Secondary | ICD-10-CM | POA: Diagnosis not present

## 2020-09-17 DIAGNOSIS — N6452 Nipple discharge: Secondary | ICD-10-CM | POA: Diagnosis not present

## 2020-09-17 DIAGNOSIS — Z170 Estrogen receptor positive status [ER+]: Secondary | ICD-10-CM | POA: Diagnosis not present

## 2020-09-17 DIAGNOSIS — D126 Benign neoplasm of colon, unspecified: Secondary | ICD-10-CM | POA: Diagnosis not present

## 2020-09-18 DIAGNOSIS — Z170 Estrogen receptor positive status [ER+]: Secondary | ICD-10-CM | POA: Diagnosis not present

## 2020-09-18 DIAGNOSIS — C50811 Malignant neoplasm of overlapping sites of right female breast: Secondary | ICD-10-CM | POA: Diagnosis not present

## 2020-09-24 ENCOUNTER — Other Ambulatory Visit: Payer: Self-pay | Admitting: Oncology

## 2020-09-24 DIAGNOSIS — N63 Unspecified lump in unspecified breast: Secondary | ICD-10-CM

## 2020-09-24 DIAGNOSIS — N6452 Nipple discharge: Secondary | ICD-10-CM

## 2020-09-29 DIAGNOSIS — Z170 Estrogen receptor positive status [ER+]: Secondary | ICD-10-CM | POA: Diagnosis not present

## 2020-09-29 DIAGNOSIS — C50811 Malignant neoplasm of overlapping sites of right female breast: Secondary | ICD-10-CM | POA: Diagnosis not present

## 2020-09-30 DIAGNOSIS — N6452 Nipple discharge: Secondary | ICD-10-CM | POA: Insufficient documentation

## 2020-09-30 DIAGNOSIS — D126 Benign neoplasm of colon, unspecified: Secondary | ICD-10-CM | POA: Insufficient documentation

## 2020-09-30 DIAGNOSIS — R928 Other abnormal and inconclusive findings on diagnostic imaging of breast: Secondary | ICD-10-CM | POA: Insufficient documentation

## 2020-10-05 ENCOUNTER — Ambulatory Visit
Admission: RE | Admit: 2020-10-05 | Discharge: 2020-10-05 | Disposition: A | Discharge status: Home / Self Care | Payer: Medicare Other | Source: Ambulatory Visit | Attending: Oncology | Admitting: Oncology

## 2020-10-05 ENCOUNTER — Other Ambulatory Visit: Payer: Self-pay

## 2020-10-05 DIAGNOSIS — C50911 Malignant neoplasm of unspecified site of right female breast: Secondary | ICD-10-CM | POA: Diagnosis not present

## 2020-10-05 DIAGNOSIS — N63 Unspecified lump in unspecified breast: Secondary | ICD-10-CM

## 2020-10-05 DIAGNOSIS — N6452 Nipple discharge: Secondary | ICD-10-CM

## 2020-10-05 IMAGING — MR MR BREAST BILAT WO/W CM
7 of 12 series · 28 of 48 positions shown · IV contrast (10 ml Gadavist)
Comparison: None.

CLINICAL DATA: 69-year-old female with recently diagnosed right
breast invasive cancer x2.

LABS:  None.
EXAM:
BILATERAL BREAST MRI WITH AND WITHOUT CONTRAST
TECHNIQUE: Multiplanar, multisequence MR images of both breasts were obtained
prior to and following the intravenous administration of 10 ml of
Gadavist

[Series 3: t2_tirm_tra ipat (a-p) · axial · 3.0mm · 0.88mm/px · 1 of 70 slices shown]
[im 1/70]
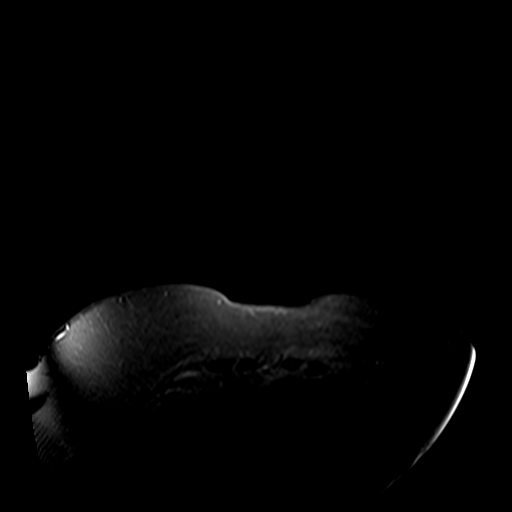

[Series 4: fl3d pre-cm no · axial · non-contrast · 0.9mm · 1.17mm/px · z∈[-72,+143]mm · 5 of 229 slices shown]
[im 1/229]
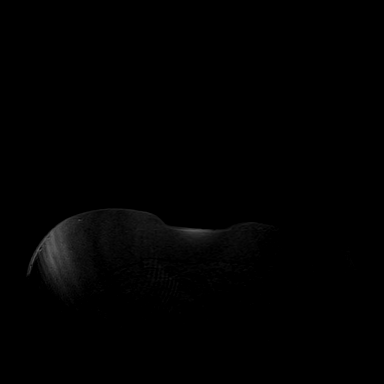
[im 58/229]
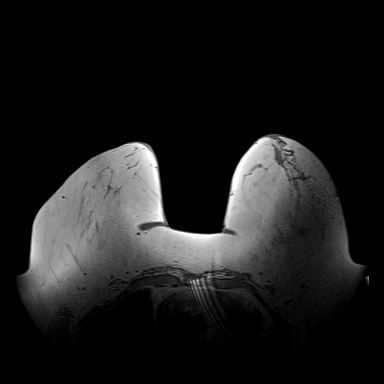
[im 115/229]
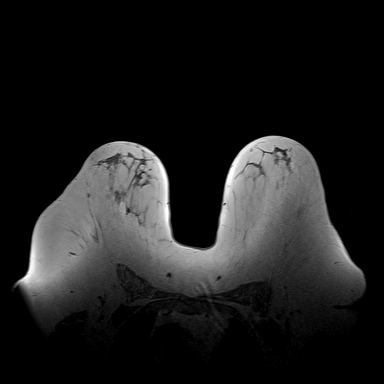
[im 172/229]
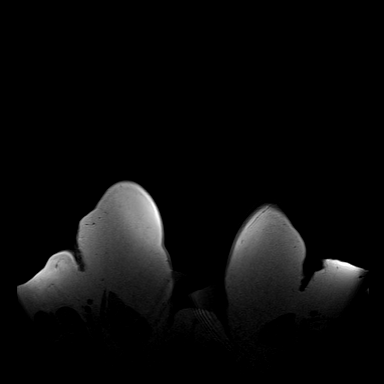
[im 229/229]
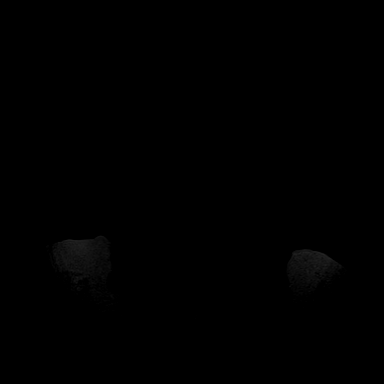

[Series 5: fl3d pre-cm · axial · non-contrast · 0.9mm · 1.08mm/px · z∈[-72,+143]mm · 5 of 240 slices shown]
[im 1/240]
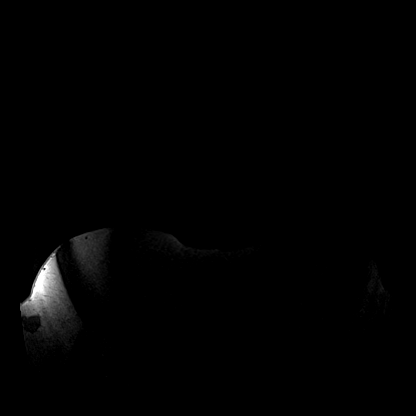
[im 60/240]
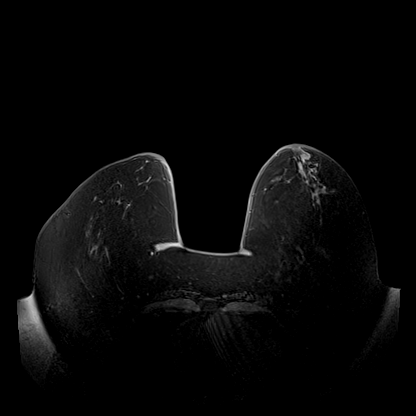
[im 120/240]
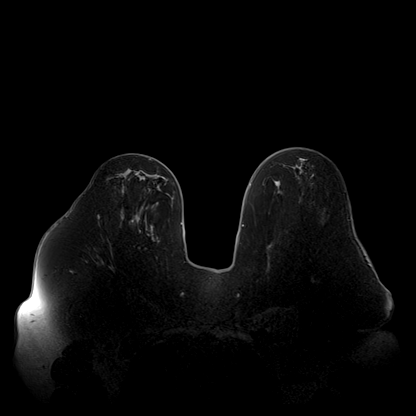
[im 180/240]
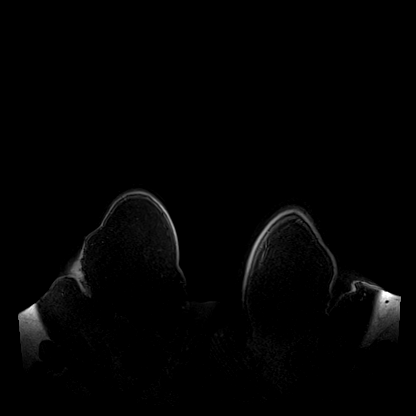
[im 240/240]
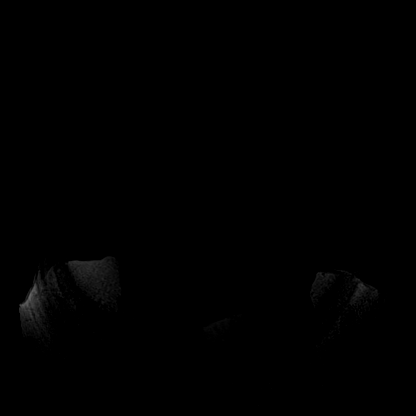

[Series 6: fl3d post-cm 20 · axial · 0.9mm · 1.08mm/px · z∈[-72,+143]mm · 5 of 240 slices shown (1 of 3)]
[im 1/240]
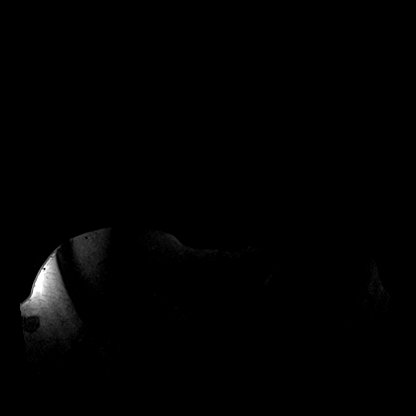
[im 60/240]
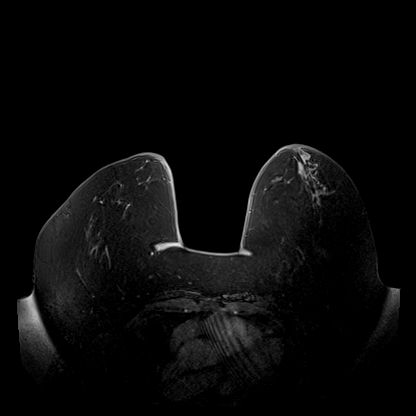
[im 120/240]
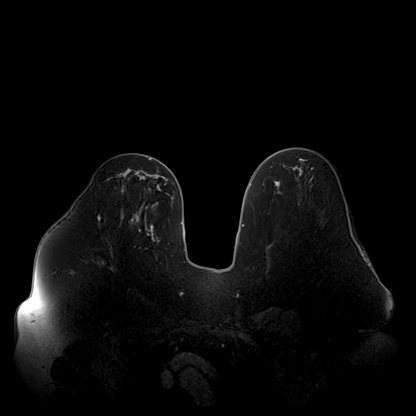
[im 180/240]
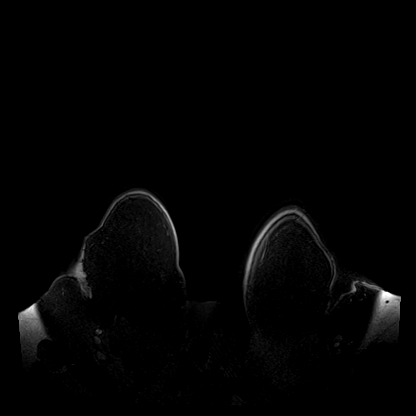
[im 240/240]
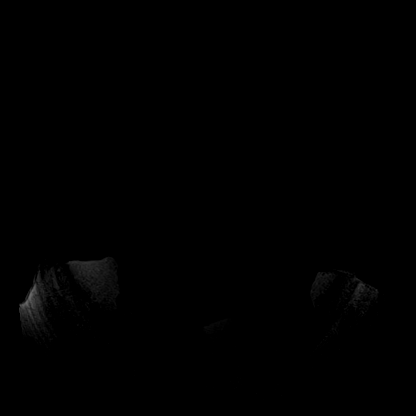

[Series 7: fl3d post-cm 20 · axial · 0.9mm · 1.08mm/px · z∈[-72,+143]mm · 5 of 240 slices shown (2 of 3)]
[im 1/240]
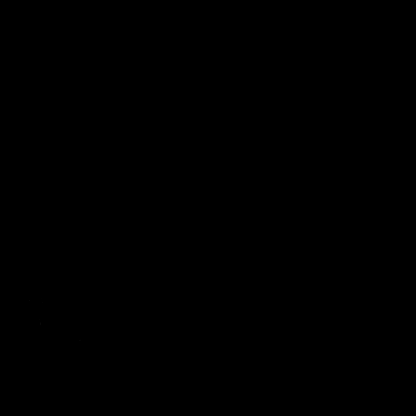
[im 60/240]
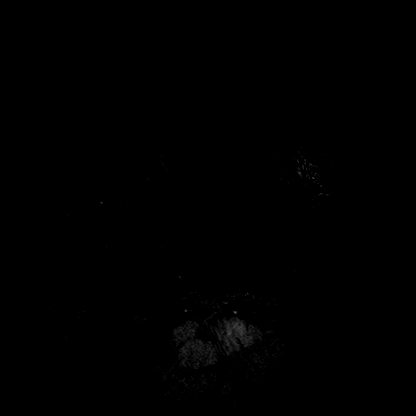
[im 120/240]
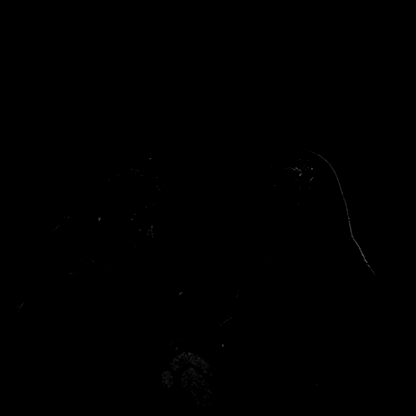
[im 180/240]
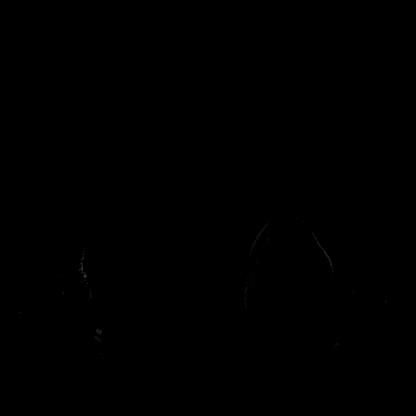
[im 240/240]
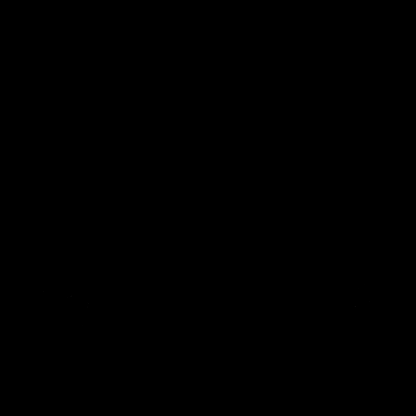

[Series 8: fl3d post-cm 20 · axial · 216.0mm · 1.08mm/px · 1 of 1 slices shown (3 of 3)]
[im 1/1]
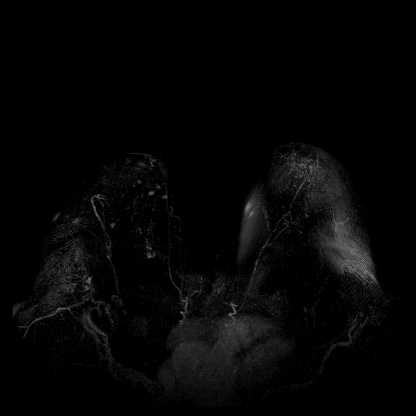

[Series 9: fl3d post-cm 3min · axial · 0.9mm · 1.08mm/px · z∈[-72,+143]mm · 6 of 240 slices shown]
[im 1/240]
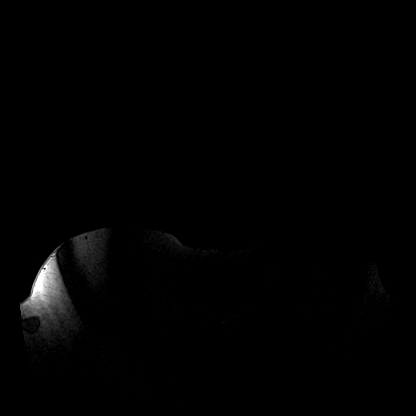
[im 48/240]
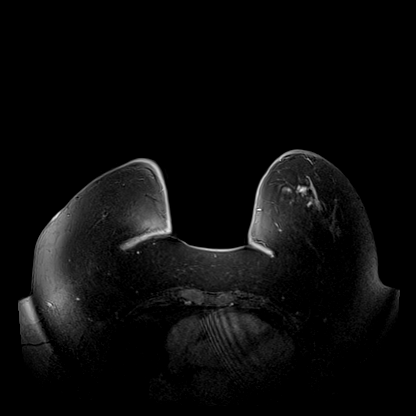
[im 96/240]
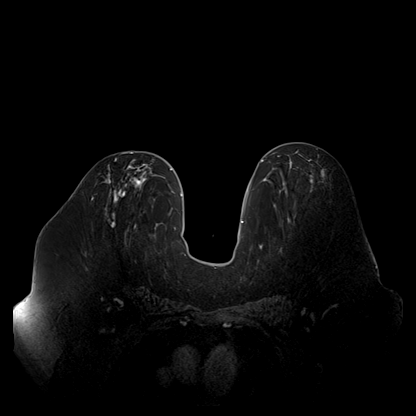
[im 144/240]
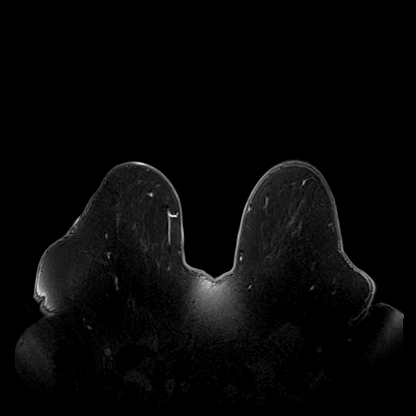
[im 192/240]
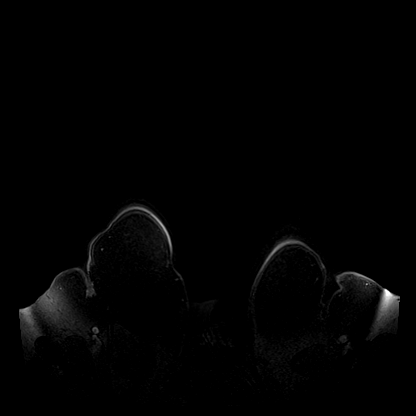
[im 240/240]
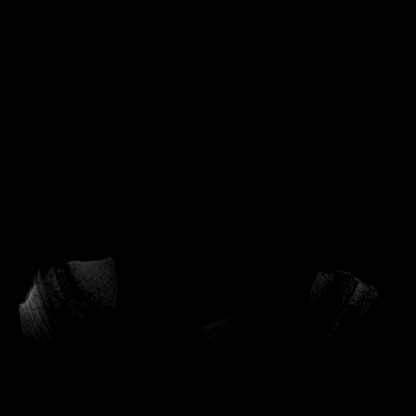

[28 of 48 positions shown; findings below may reference images not displayed]

Three-dimensional MR images were rendered by post-processing of the
original MR data on an independent workstation. The
three-dimensional MR images were interpreted, and findings are
reported in the following complete MRI report for this study. Three
dimensional images were evaluated at the independent interpreting
workstation using the DynaCAD thin client.
FINDINGS: Breast composition: b. Scattered fibroglandular tissue.

Background parenchymal enhancement: Mild

Right breast:

There is susceptibility artifact in the upper outer right breast
(series 4, image 147), consistent with a biopsy marking clip.
Adjacent to this there is a small enhancing mass measuring 0.6 x
cm (series 7, image 144), consistent with biopsy proven malignancy.

There is another focus of susceptibility artifact in the upper inner
right breast anterior depth (series 4, image 118), consistent with a
biopsy marking clip. Posterior to the biopsy marking clip there are
multiple small masses/clumped non mass enhancement spanning up to
3.6 cm (series 7, image 113), which likely includes the biopsy
proven malignancy. Inferior and slightly medial to this there is a
round enhancing mass measuring 0.5 cm (series 7, image 130), located
approximately 1.8 cm inferior to the biopsy proven malignancy.

In the upper inner right breast posterior depth there is a small
mass and clumped non mass enhancement spanning at least 2.3 cm in [facility]
dimension (series 7, image 116), which likely corresponds to the
asymmetry seen mammographically. The small associated mass measures
approximately 0.6 cm (series 7, image 110).

Additionally between this clumped non mass enhancement posteriorly
and the previously described anterior group of small masses/clumped
non mass enhancement there are additional small foci of enhancement
which are less prominent though asymmetric compared to the left
breast and raise the possibility of additional areas of malignancy.
This area spans up to 8 cm.

Left breast: No mass or abnormal enhancement.

Lymph nodes: No abnormal appearing lymph nodes.

Ancillary findings:  None.
IMPRESSION: 1. Multiple small masses and areas of non mass enhancement involving
the upper inner and upper outer quadrants of the right breast. In
the upper inner quadrant there are abnormal findings spanning up to
8 cm.

2.  No MRI evidence of malignancy in the left breast.

3.  No lymphadenopathy.

RECOMMENDATION:
If breast conservation is still a consideration, stereotactic core
needle biopsy could be performed for they asymmetry identified
mammographically in the upper inner right breast posterior, which
likely corresponds to the 2.3 cm area of abnormal non mass
enhancement identified in this area on MRI. Otherwise, recommend
proceeding with surgical planning/treatment.

BI-RADS CATEGORY  4: Suspicious.

## 2020-10-05 MED ORDER — GADOBUTROL 1 MMOL/ML IV SOLN
10.0000 mL | Freq: Once | INTRAVENOUS | Status: AC | PRN
  Administered 2020-10-05: 10 mL via INTRAVENOUS

## 2020-10-07 DIAGNOSIS — C50811 Malignant neoplasm of overlapping sites of right female breast: Secondary | ICD-10-CM | POA: Diagnosis not present

## 2020-10-07 DIAGNOSIS — Z808 Family history of malignant neoplasm of other organs or systems: Secondary | ICD-10-CM | POA: Insufficient documentation

## 2020-10-07 DIAGNOSIS — Z170 Estrogen receptor positive status [ER+]: Secondary | ICD-10-CM | POA: Diagnosis not present

## 2020-10-07 DIAGNOSIS — R928 Other abnormal and inconclusive findings on diagnostic imaging of breast: Secondary | ICD-10-CM | POA: Diagnosis not present

## 2020-10-07 DIAGNOSIS — E041 Nontoxic single thyroid nodule: Secondary | ICD-10-CM | POA: Insufficient documentation

## 2020-10-07 DIAGNOSIS — Z7189 Other specified counseling: Secondary | ICD-10-CM | POA: Diagnosis not present

## 2020-10-12 DIAGNOSIS — I10 Essential (primary) hypertension: Secondary | ICD-10-CM | POA: Diagnosis not present

## 2020-10-12 DIAGNOSIS — N390 Urinary tract infection, site not specified: Secondary | ICD-10-CM | POA: Diagnosis not present

## 2020-10-12 DIAGNOSIS — R928 Other abnormal and inconclusive findings on diagnostic imaging of breast: Secondary | ICD-10-CM | POA: Diagnosis not present

## 2020-10-12 DIAGNOSIS — C50811 Malignant neoplasm of overlapping sites of right female breast: Secondary | ICD-10-CM | POA: Diagnosis not present

## 2020-10-12 DIAGNOSIS — N6452 Nipple discharge: Secondary | ICD-10-CM | POA: Diagnosis not present

## 2020-10-12 DIAGNOSIS — Z170 Estrogen receptor positive status [ER+]: Secondary | ICD-10-CM | POA: Diagnosis not present

## 2020-10-14 DIAGNOSIS — I10 Essential (primary) hypertension: Secondary | ICD-10-CM | POA: Diagnosis not present

## 2020-10-14 DIAGNOSIS — Z170 Estrogen receptor positive status [ER+]: Secondary | ICD-10-CM | POA: Diagnosis not present

## 2020-10-14 DIAGNOSIS — N390 Urinary tract infection, site not specified: Secondary | ICD-10-CM | POA: Diagnosis not present

## 2020-10-14 DIAGNOSIS — C50811 Malignant neoplasm of overlapping sites of right female breast: Secondary | ICD-10-CM | POA: Diagnosis not present

## 2020-10-15 DIAGNOSIS — E1165 Type 2 diabetes mellitus with hyperglycemia: Secondary | ICD-10-CM | POA: Diagnosis not present

## 2020-10-16 DIAGNOSIS — E1165 Type 2 diabetes mellitus with hyperglycemia: Secondary | ICD-10-CM | POA: Diagnosis not present

## 2020-10-16 DIAGNOSIS — I209 Angina pectoris, unspecified: Secondary | ICD-10-CM | POA: Diagnosis not present

## 2020-10-19 ENCOUNTER — Ambulatory Visit: Payer: Self-pay | Admitting: Surgery

## 2020-10-19 DIAGNOSIS — C50911 Malignant neoplasm of unspecified site of right female breast: Secondary | ICD-10-CM | POA: Diagnosis not present

## 2020-10-19 DIAGNOSIS — Z170 Estrogen receptor positive status [ER+]: Secondary | ICD-10-CM

## 2020-10-19 NOTE — H&P (View-Only) (Signed)
[place] Appointment: 10/19/2020 9:20 AM Location: [facility] Patient #: [id number] DOB: July 15, 1951 Divorced / Language: [name] / Race: White Female  History of Present Illness Marcello Moores A. Twila Rappa MD; 10/19/2020 11:08 AM) Patient words: Patient presents for evaluation of multifocal right breast cancer. She was found on screening mammography to have a density in the right breast upper outer quadrant. There are 2 areas in the right upper and right inner quadrant. 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 MRI was performed. The area of enhancement in between the 2 has not been biopsied. She was seen by medical oncology at [facility] and presents today for evaluation of surgical options for treatment of her right breast cancer. Patient denies history of breast pain, nipple discharge or masses in either breast. Family history of a first-degree aunt with breast cancer.              : 70 year old female with recently diagnosed right breast invasive cancer x2.  LABS: None.  EXAM: BILATERAL BREAST MRI WITH AND WITHOUT CONTRAST  TECHNIQUE: Multiplanar, multisequence MR images of both breasts were obtained prior to and following the intravenous administration of 10 ml of Gadavist  Three-dimensional MR images were rendered by post-processing of the original MR data on an independent workstation. The three-dimensional MR images were interpreted, and findings are reported in the following complete MRI report for this study. Three dimensional images were evaluated at the independent interpreting workstation using the DynaCAD thin client.  COMPARISON: None.  FINDINGS: Breast composition: b. Scattered fibroglandular tissue.  Background parenchymal enhancement: Mild  Right breast:  There is susceptibility artifact in the upper outer right breast (series 4, image 147),  consistent with a biopsy marking clip. Adjacent to this there is a small enhancing mass measuring 0.6 x 0.5 cm (series 7, image 144), consistent with biopsy proven malignancy.  There is another focus of susceptibility artifact in the upper inner right breast anterior depth (series 4, image 118), consistent with a biopsy marking clip. Posterior to the biopsy marking clip there are multiple small masses/clumped non mass enhancement spanning up to 3.6 cm (series 7, image 113), which likely includes the biopsy proven malignancy. Inferior and slightly medial to this there is a round enhancing mass measuring 0.5 cm (series 7, image 130), located approximately 1.8 cm inferior to the biopsy proven malignancy.  In the upper inner right breast posterior depth there is a small mass and clumped non mass enhancement spanning at least 2.3 cm in [facility] dimension (series 7, image 116), which likely corresponds to the asymmetry seen mammographically. The small associated mass measures approximately 0.6 cm (series 7, image 110).  Additionally between this clumped non mass enhancement posteriorly and the previously described anterior group of small masses/clumped non mass enhancement there are additional small foci of enhancement which are less prominent though asymmetric compared to the left breast and raise the possibility of additional areas of malignancy. This area spans up to 8 cm.  Left breast: No mass or abnormal enhancement.  Lymph nodes: No abnormal appearing lymph nodes.  Ancillary findings: None.  IMPRESSION: 1. Multiple small masses and areas of non mass enhancement involving the upper inner and upper outer quadrants of the right breast. In the upper inner quadrant there are abnormal findings spanning up to 8 cm.  2. No MRI evidence of malignancy in the left breast.  3. No lymphadenopathy.  RECOMMENDATION: If breast conservation is still a  consideration, stereotactic core needle  biopsy could be performed for they asymmetry identified mammographically in the upper inner right breast posterior, which likely corresponds to the 2.3 cm area of abnormal non mass enhancement identified in this area on MRI. Otherwise, recommend proceeding with surgical planning/treatment.  BI-RADS CATEGORY 4: Suspicious.   Electronically Signed By: [name] M.D. On: 10/05/2020 15:00.  The patient is a 70 year old female.   Past Surgical History ([name], [place]; 10/19/2020 9:13 AM) Appendectomy Breast Biopsy Right. Colon Polyp Removal - Colonoscopy Foot Surgery Left. Gallbladder Surgery - Laparoscopic Hysterectomy (not due to cancer) - Partial Oral Surgery  Diagnostic Studies History ([name], CMA; 10/19/2020 9:13 AM) Colonoscopy 1-5 years ago Mammogram within last year Pap Smear 1-5 years ago  Allergies ([name], CMA; 10/19/2020 9:14 AM) Doxycycline *DERMATOLOGICALS* metroNIDAZOLE *ANTI-INFECTIVE AGENTS - MISC.* Allergies Reconciled  Medication History ([name], CMA; 10/19/2020 9:14 AM) Esomeprazole Magnesium (40MG Capsule DR, Oral) Active. Glimepiride (2MG Tablet, Oral) Active. hydroCHLOROthiazide (25MG Tablet, Oral) Active. metFORMIN HCl (1000MG Tablet, Oral) Active. Metoprolol Tartrate (50MG Tablet, Oral) Active. Lisinopril (5MG Tablet, Oral) Active. Rosuvastatin Calcium (5MG Tablet, Oral) Active. valACYclovir HCl (1GM Tablet, Oral) Active. Medications Reconciled  Social History Antonietta Jewel, CMA; 10/19/2020 9:13 AM) Alcohol use Moderate alcohol use. Caffeine use Coffee, Tea. No drug use Tobacco use Former smoker.  Family History ([name], [place]; 10/19/2020 9:13 AM) Breast Cancer Family Members In General. Cancer Son. Cervical Cancer Family Members In General. Colon Polyps Father. Diabetes Mellitus Family Members In General, Father. Heart Disease Family Members In General. Hypertension  Father. Respiratory Condition Family Members In General.  Pregnancy / Birth History Antonietta Jewel, [place]; 10/19/2020 9:13 AM) Age at menarche 73 years. Contraceptive History Oral contraceptives. Gravida 3 Maternal age 31-20 Para 3  Other Problems ([name], CMA; 10/19/2020 9:13 AM) Anxiety Disorder Arthritis Back Pain Bladder Problems Breast Cancer Chest pain Cholelithiasis Chronic Obstructive Lung Disease Depression Diabetes Mellitus Gastroesophageal Reflux Disease Heart murmur Hemorrhoids High blood pressure Hypercholesterolemia Lump In Breast Melanoma Sleep Apnea     Review of Systems (Chanel Nolan CMA; 10/19/2020 9:13 AM) General Present- Fatigue and Weight Gain. Not Present- Appetite Loss, Chills, Fever, Night Sweats and Weight Loss. HEENT Present- Hearing Loss and Ringing in the Ears. Not Present- Earache, Hoarseness, Nose Bleed, Oral Ulcers, Seasonal Allergies, Sinus Pain, Sore Throat, Visual Disturbances, Wears glasses/contact lenses and Yellow Eyes. Respiratory Present- Snoring. Not Present- Bloody sputum, Chronic Cough, Difficulty Breathing and Wheezing. Breast Present- Nipple Discharge. Not Present- Breast Mass, Breast Pain and Skin Changes. Cardiovascular Present- Difficulty Breathing Lying Down. Not Present- Chest Pain, Leg Cramps, Palpitations, Rapid Heart Rate, Shortness of Breath and Swelling of Extremities. Gastrointestinal Present- Constipation, Difficulty Swallowing and Hemorrhoids. Not Present- Abdominal Pain, Bloating, Bloody Stool, Change in Bowel Habits, Chronic diarrhea, Excessive gas, Gets full quickly at meals, Indigestion, Nausea, Rectal Pain and Vomiting. Female Genitourinary Present- Frequency, Nocturia and Urgency. Not Present- Painful Urination and Pelvic Pain. Musculoskeletal Present- Back Pain, Joint Pain, Joint Stiffness and Muscle Weakness. Not Present- Muscle Pain and Swelling of Extremities. Neurological Present-  Headaches, Numbness and Tingling. Not Present- Decreased Memory, Fainting, Seizures, Tremor, Trouble walking and Weakness. Psychiatric Present- Anxiety and Change in Sleep Pattern. Not Present- Bipolar, Depression, Fearful and Frequent crying.  Vitals (Chanel Nolan CMA; 10/19/2020 9:15 AM) 10/19/2020 9:14 AM Weight: 264.5 lb Height: 60in Body Surface Area: 2.1 m Body Mass Index: 51.66 kg/m  Temp.: 96.57F  Pulse: 78 (Regular)  BP: 132/80(Sitting, Left Arm, Standard)  Physical Exam (Aunisty Reali A. Devann Cribb MD; 10/19/2020 11:08 AM)  General Mental Status-Alert. General Appearance-Consistent with stated age. Hydration-Well hydrated. Voice-Normal.  Head and Neck Head-normocephalic, atraumatic with no lesions or palpable masses. Trachea-midline. Thyroid Gland Characteristics - normal size and consistency.  Breast Breast - Left-Symmetric, Non Tender, No Biopsy scars, no Dimpling - Left, No Inflammation, No Lumpectomy scars, No Mastectomy scars, No Peau d' Orange. Breast - Right-Symmetric, Non Tender, No Biopsy scars, no Dimpling - Right, No Inflammation, No Lumpectomy scars, No Mastectomy scars, No Peau d' Orange. Breast Lump-No Palpable Breast Mass.  Neurologic Neurologic evaluation reveals -alert and oriented x 3 with no impairment of recent or remote memory. Mental Status-Normal.  Musculoskeletal Normal Exam - Left-Upper Extremity Strength Normal and Lower Extremity Strength Normal. Normal Exam - Right-Upper Extremity Strength Normal and Lower Extremity Strength Normal.  Lymphatic Head & Neck  General Head & Neck Lymphatics: Bilateral - Description - Normal. Axillary  General Axillary Region: Bilateral - Description - Normal. Tenderness - Non Tender.    Assessment & Plan (Kimie Pidcock A. Dalaysia Harms MD; 10/19/2020 11:10 AM)  BREAST CANCER, RIGHT (C50.911) Impression: multifocal large breasts and wants to conserve her breast if  possible Recommend MRI bx to further evaluate extent on the right can have a large lumpectomy and SLN mapping Discussed seed localized lumpectomy and the fact that we would need to use multiple more close to bracket the area. Cosmetically, she may have small volume loss given her large breast size could be some cosmetic deformity which should be mild. We discuss adjuvant radiation therapy in this setting. If this fails, she will need to proceed to mastectomy but I feel is reasonable to consider this option since she has large breasts which makes a horse lumpectomy possible with a reasonable cosmetic outcome. Discussed sentinel lymph node mapping and lymphedema risk. Discussed mastectomy with reconstruction as well. She is opted for right breast seed localized lumpectomy which may need to be bracketed or 3 seeds possibly with right axillary sentinel lymph node mapping. Risk of lumpectomy include bleeding, infection, seroma, more surgery, use of seed/wire, wound care, cosmetic deformity and the need for other treatments, death , blood clots, death. Pt agrees to proceed. Risk of sentinel lymph node mapping include bleeding, infection, lymphedema, shoulder pain. stiffness, dye allergy. cosmetic deformity , blood clots, death, need for more surgery. Pt agrees to proceed.  total time 45 minutes  Current Plans You are being scheduled for surgery- Our schedulers will call you.  You should hear from our office's scheduling department within 5 working days about the location, date, and time of surgery. We try to make accommodations for patient's preferences in scheduling surgery, but sometimes the OR schedule or the surgeon's schedule prevents [place] from making those accommodations.  If you have not heard from our office [phone]) in 5 working days, call the office and ask for your surgeon's nurse.  If you have other questions about your diagnosis, plan, or surgery, call the office and ask for your  surgeon's nurse.  Pt Education - [facility] Breast Cancer Information Given - [facility] "Breast Journey" Package We discussed the staging and pathophysiology of breast cancer. We discussed all of the different options for treatment for breast cancer including surgery, chemotherapy, radiation therapy, Herceptin, and antiestrogen therapy. We discussed a sentinel lymph node biopsy as she does not appear to having lymph node involvement right now. We discussed the performance of that with injection of radioactive tracer and blue dye. We discussed that she would have  an incision underneath her axillary hairline. We discussed that there is a bout a 10-20% chance of having a positive node with a sentinel lymph node biopsy and we will await the permanent pathology to make any other first further decisions in terms of her treatment. One of these options might be to return to the operating room to perform an axillary lymph node dissection. We discussed about a 1-2% risk lifetime of chronic shoulder pain as well as lymphedema associated with a sentinel lymph node biopsy. We discussed the options for treatment of the breast cancer which included lumpectomy versus a mastectomy. We discussed the performance of the lumpectomy with a wire placement. We discussed a 10-20% chance of a positive margin requiring reexcision in the operating room. We also discussed that she may need radiation therapy or antiestrogen therapy or both if she undergoes lumpectomy. We discussed the mastectomy and the postoperative care for that as well. We discussed that there is no difference in her survival whether she undergoes lumpectomy with radiation therapy or antiestrogen therapy versus a mastectomy. There is a slight difference in the local recurrence rate being 3-5% with lumpectomy and about 1% with a mastectomy. We discussed the risks of operation including bleeding, infection, possible reoperation. She understands her further therapy will be based on  what her stages at the time of her operation.  Pt Education - [facility] breast cancer surgery: discussed with patient and provided information. Pt Education - [facility] (After Breast Cancer) Class Info: discussed with patient and provided information.

## 2020-10-19 NOTE — H&P (Signed)
[place] Appointment: 10/19/2020 9:20 AM Location: [facility] Patient #: [id number] DOB: July 15, 1951 Divorced / Language: Beth Phelps / Race: White Female  History of Present Illness Beth Phelps [name] MD; 10/19/2020 11:08 AM) Patient words: Patient presents for evaluation of multifocal right breast cancer. She was found on screening mammography to have a density in the right breast upper outer quadrant. There are 2 areas in the right upper and right inner quadrant. 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 MRI was performed. The area of enhancement in between the 2 has not been biopsied. She was seen by medical oncology at [facility] and presents today for evaluation of surgical options for treatment of her right breast cancer. Patient denies history of breast pain, nipple discharge or masses in either breast. Family history of a first-degree aunt with breast cancer.              : 70 year old female with recently diagnosed right breast invasive cancer x2.  LABS: None.  EXAM: BILATERAL BREAST MRI WITH AND WITHOUT CONTRAST  TECHNIQUE: Multiplanar, multisequence MR images of both breasts were obtained prior to and following the intravenous administration of 10 ml of Gadavist  Three-dimensional MR images were rendered by post-processing of the original MR data on an independent workstation. The three-dimensional MR images were interpreted, and findings are reported in the following complete MRI report for this study. Three dimensional images were evaluated at the independent interpreting workstation using the DynaCAD thin client.  COMPARISON: None.  FINDINGS: Breast composition: b. Scattered fibroglandular tissue.  Background parenchymal enhancement: Mild  Right breast:  There is susceptibility artifact in the upper outer right breast (series 4, image 147),  consistent with a biopsy marking clip. Adjacent to this there is a small enhancing mass measuring 0.6 x 0.5 cm (series 7, image 144), consistent with biopsy proven malignancy.  There is another focus of susceptibility artifact in the upper inner right breast anterior depth (series 4, image 118), consistent with a biopsy marking clip. Posterior to the biopsy marking clip there are multiple small masses/clumped non mass enhancement spanning up to 3.6 cm (series 7, image 113), which likely includes the biopsy proven malignancy. Inferior and slightly medial to this there is a round enhancing mass measuring 0.5 cm (series 7, image 130), located approximately 1.8 cm inferior to the biopsy proven malignancy.  In the upper inner right breast posterior depth there is a small mass and clumped non mass enhancement spanning at least 2.3 cm in [facility] dimension (series 7, image 116), which likely corresponds to the asymmetry seen mammographically. The small associated mass measures approximately 0.6 cm (series 7, image 110).  Additionally between this clumped non mass enhancement posteriorly and the previously described anterior group of small masses/clumped non mass enhancement there are additional small foci of enhancement which are less prominent though asymmetric compared to the left breast and raise the possibility of additional areas of malignancy. This area spans up to 8 cm.  Left breast: No mass or abnormal enhancement.  Lymph nodes: No abnormal appearing lymph nodes.  Ancillary findings: None.  IMPRESSION: 1. Multiple small masses and areas of non mass enhancement involving the upper inner and upper outer quadrants of the right breast. In the upper inner quadrant there are abnormal findings spanning up to 8 cm.  2. No MRI evidence of malignancy in the left breast.  3. No lymphadenopathy.  RECOMMENDATION: If breast conservation is still a  consideration, stereotactic core needle  biopsy could be performed for they asymmetry identified mammographically in the upper inner right breast posterior, which likely corresponds to the 2.3 cm area of abnormal non mass enhancement identified in this area on MRI. Otherwise, recommend proceeding with surgical planning/treatment.  BI-RADS CATEGORY 4: Suspicious.   Electronically Signed By: Beth Phelps M.D. On: 10/05/2020 15:00.  The patient is a 70 year old female.   Past Surgical History (Beth Phelps, [place]; 10/19/2020 9:13 AM) Appendectomy Breast Biopsy Right. Colon Polyp Removal - Colonoscopy Foot Surgery Left. Gallbladder Surgery - Laparoscopic Hysterectomy (not due to cancer) - Partial Oral Surgery  Diagnostic Studies History (Beth Phelps, CMA; 10/19/2020 9:13 AM) Colonoscopy 1-5 years ago Mammogram within last year Pap Smear 1-5 years ago  Allergies (Beth Phelps, CMA; 10/19/2020 9:14 AM) Doxycycline *DERMATOLOGICALS* metroNIDAZOLE *ANTI-INFECTIVE AGENTS - MISC.* Allergies Reconciled  Medication History (Beth Phelps, CMA; 10/19/2020 9:14 AM) Esomeprazole Magnesium (40MG Capsule DR, Oral) Active. Glimepiride (2MG Tablet, Oral) Active. hydroCHLOROthiazide (25MG Tablet, Oral) Active. metFORMIN HCl (1000MG Tablet, Oral) Active. Metoprolol Tartrate (50MG Tablet, Oral) Active. Lisinopril (5MG Tablet, Oral) Active. Rosuvastatin Calcium (5MG Tablet, Oral) Active. valACYclovir HCl (1GM Tablet, Oral) Active. Medications Reconciled  Social History Beth Phelps, CMA; 10/19/2020 9:13 AM) Alcohol use Moderate alcohol use. Caffeine use Coffee, Tea. No drug use Tobacco use Former smoker.  Family History (Beth Phelps, [place]; 10/19/2020 9:13 AM) Breast Cancer Family Members In General. Cancer Son. Cervical Cancer Family Members In General. Colon Polyps Father. Diabetes Mellitus Family Members In General, Father. Heart Disease Family Members In General. Hypertension  Father. Respiratory Condition Family Members In General.  Pregnancy / Birth History Beth Phelps, [place]; 10/19/2020 9:13 AM) Age at menarche 73 years. Contraceptive History Oral contraceptives. Gravida 3 Maternal age 31-20 Para 3  Other Problems (Beth Phelps, CMA; 10/19/2020 9:13 AM) Anxiety Disorder Arthritis Back Pain Bladder Problems Breast Cancer Chest pain Cholelithiasis Chronic Obstructive Lung Disease Depression Diabetes Mellitus Gastroesophageal Reflux Disease Heart murmur Hemorrhoids High blood pressure Hypercholesterolemia Lump In Breast Melanoma Sleep Apnea     Review of Systems (Beth Nolan CMA; 10/19/2020 9:13 AM) General Present- Fatigue and Weight Gain. Not Present- Appetite Loss, Chills, Fever, Night Sweats and Weight Loss. HEENT Present- Hearing Loss and Ringing in the Ears. Not Present- Earache, Hoarseness, Nose Bleed, Oral Ulcers, Seasonal Allergies, Sinus Pain, Sore Throat, Visual Disturbances, Wears glasses/contact lenses and Yellow Eyes. Respiratory Present- Snoring. Not Present- Bloody sputum, Chronic Cough, Difficulty Breathing and Wheezing. Breast Present- Nipple Discharge. Not Present- Breast Mass, Breast Pain and Skin Changes. Cardiovascular Present- Difficulty Breathing Lying Down. Not Present- Chest Pain, Leg Cramps, Palpitations, Rapid Heart Rate, Shortness of Breath and Swelling of Extremities. Gastrointestinal Present- Constipation, Difficulty Swallowing and Hemorrhoids. Not Present- Abdominal Pain, Bloating, Bloody Stool, Change in Bowel Habits, Chronic diarrhea, Excessive gas, Gets full quickly at meals, Indigestion, Nausea, Rectal Pain and Vomiting. Female Genitourinary Present- Frequency, Nocturia and Urgency. Not Present- Painful Urination and Pelvic Pain. Musculoskeletal Present- Back Pain, Joint Pain, Joint Stiffness and Muscle Weakness. Not Present- Muscle Pain and Swelling of Extremities. Neurological Present-  Headaches, Numbness and Tingling. Not Present- Decreased Memory, Fainting, Seizures, Tremor, Trouble walking and Weakness. Psychiatric Present- Anxiety and Change in Sleep Pattern. Not Present- Bipolar, Depression, Fearful and Frequent crying.  Vitals (Beth Nolan CMA; 10/19/2020 9:15 AM) 10/19/2020 9:14 AM Weight: 264.5 lb Height: 60in Body Surface Area: 2.1 m Body Mass Index: 51.66 kg/m  Temp.: 96.57F  Pulse: 78 (Regular)  BP: 132/80(Sitting, Left Arm, Standard)  Physical Exam (Beth Woodberry A. Angeli Demilio MD; 10/19/2020 11:08 AM)  General Mental Status-Alert. General Appearance-Consistent with stated age. Hydration-Well hydrated. Voice-Normal.  Head and Neck Head-normocephalic, atraumatic with no lesions or palpable masses. Trachea-midline. Thyroid Gland Characteristics - normal size and consistency.  Breast Breast - Left-Symmetric, Non Tender, No Biopsy scars, no Dimpling - Left, No Inflammation, No Lumpectomy scars, No Mastectomy scars, No Peau d' Orange. Breast - Right-Symmetric, Non Tender, No Biopsy scars, no Dimpling - Right, No Inflammation, No Lumpectomy scars, No Mastectomy scars, No Peau d' Orange. Breast Lump-No Palpable Breast Mass.  Neurologic Neurologic evaluation reveals -alert and oriented x 3 with no impairment of recent or remote memory. Mental Status-Normal.  Musculoskeletal Normal Exam - Left-Upper Extremity Strength Normal and Lower Extremity Strength Normal. Normal Exam - Right-Upper Extremity Strength Normal and Lower Extremity Strength Normal.  Lymphatic Head & Neck  General Head & Neck Lymphatics: Bilateral - Description - Normal. Axillary  General Axillary Region: Bilateral - Description - Normal. Tenderness - Non Tender.    Assessment & Plan (Beth Kneisley A. Hykeem Ojeda MD; 10/19/2020 11:10 AM)  BREAST CANCER, RIGHT (C50.911) Impression: multifocal large breasts and wants to conserve her breast if  possible Recommend MRI bx to further evaluate extent on the right can have a large lumpectomy and SLN mapping Discussed seed localized lumpectomy and the fact that we would need to use multiple more close to bracket the area. Cosmetically, she may have small volume loss given her large breast size could be some cosmetic deformity which should be mild. We discuss adjuvant radiation therapy in this setting. If this fails, she will need to proceed to mastectomy but I feel is reasonable to consider this option since she has large breasts which makes a horse lumpectomy possible with a reasonable cosmetic outcome. Discussed sentinel lymph node mapping and lymphedema risk. Discussed mastectomy with reconstruction as well. She is opted for right breast seed localized lumpectomy which may need to be bracketed or 3 seeds possibly with right axillary sentinel lymph node mapping. Risk of lumpectomy include bleeding, infection, seroma, more surgery, use of seed/wire, wound care, cosmetic deformity and the need for other treatments, death , blood clots, death. Pt agrees to proceed. Risk of sentinel lymph node mapping include bleeding, infection, lymphedema, shoulder pain. stiffness, dye allergy. cosmetic deformity , blood clots, death, need for more surgery. Pt agrees to proceed.  total time 45 minutes  Current Plans You are being scheduled for surgery- Our schedulers will call you.  You should hear from our office's scheduling department within 5 working days about the location, date, and time of surgery. We try to make accommodations for patient's preferences in scheduling surgery, but sometimes the OR schedule or the surgeon's schedule prevents [place] from making those accommodations.  If you have not heard from our office [phone]) in 5 working days, call the office and ask for your surgeon's nurse.  If you have other questions about your diagnosis, plan, or surgery, call the office and ask for your  surgeon's nurse.  Pt Education - [facility] Breast Cancer Information Given - [facility] "Breast Journey" Package We discussed the staging and pathophysiology of breast cancer. We discussed all of the different options for treatment for breast cancer including surgery, chemotherapy, radiation therapy, Herceptin, and antiestrogen therapy. We discussed a sentinel lymph node biopsy as she does not appear to having lymph node involvement right now. We discussed the performance of that with injection of radioactive tracer and blue dye. We discussed that she would have  an incision underneath her axillary hairline. We discussed that there is a bout a 10-20% chance of having a positive node with a sentinel lymph node biopsy and we will await the permanent pathology to make any other first further decisions in terms of her treatment. One of these options might be to return to the operating room to perform an axillary lymph node dissection. We discussed about a 1-2% risk lifetime of chronic shoulder pain as well as lymphedema associated with a sentinel lymph node biopsy. We discussed the options for treatment of the breast cancer which included lumpectomy versus a mastectomy. We discussed the performance of the lumpectomy with a wire placement. We discussed a 10-20% chance of a positive margin requiring reexcision in the operating room. We also discussed that she may need radiation therapy or antiestrogen therapy or both if she undergoes lumpectomy. We discussed the mastectomy and the postoperative care for that as well. We discussed that there is no difference in her survival whether she undergoes lumpectomy with radiation therapy or antiestrogen therapy versus a mastectomy. There is a slight difference in the local recurrence rate being 3-5% with lumpectomy and about 1% with a mastectomy. We discussed the risks of operation including bleeding, infection, possible reoperation. She understands her further therapy will be based on  what her stages at the time of her operation.  Pt Education - [facility] breast cancer surgery: discussed with patient and provided information. Pt Education - [facility] (After Breast Cancer) Class Info: discussed with patient and provided information.

## 2020-10-21 ENCOUNTER — Other Ambulatory Visit: Payer: Self-pay | Admitting: Surgery

## 2020-10-21 ENCOUNTER — Other Ambulatory Visit: Payer: Self-pay | Admitting: Internal Medicine

## 2020-10-21 DIAGNOSIS — N6310 Unspecified lump in the right breast, unspecified quadrant: Secondary | ICD-10-CM

## 2020-10-26 DIAGNOSIS — R928 Other abnormal and inconclusive findings on diagnostic imaging of breast: Secondary | ICD-10-CM | POA: Insufficient documentation

## 2020-10-26 DIAGNOSIS — R4589 Other symptoms and signs involving emotional state: Secondary | ICD-10-CM | POA: Insufficient documentation

## 2020-10-28 DIAGNOSIS — Z170 Estrogen receptor positive status [ER+]: Secondary | ICD-10-CM | POA: Diagnosis not present

## 2020-10-28 DIAGNOSIS — D126 Benign neoplasm of colon, unspecified: Secondary | ICD-10-CM | POA: Diagnosis not present

## 2020-10-28 DIAGNOSIS — I10 Essential (primary) hypertension: Secondary | ICD-10-CM | POA: Diagnosis not present

## 2020-10-28 DIAGNOSIS — N6452 Nipple discharge: Secondary | ICD-10-CM | POA: Diagnosis not present

## 2020-10-28 DIAGNOSIS — C50811 Malignant neoplasm of overlapping sites of right female breast: Secondary | ICD-10-CM | POA: Diagnosis not present

## 2020-10-28 DIAGNOSIS — R928 Other abnormal and inconclusive findings on diagnostic imaging of breast: Secondary | ICD-10-CM | POA: Diagnosis not present

## 2020-10-28 DIAGNOSIS — Z7189 Other specified counseling: Secondary | ICD-10-CM | POA: Diagnosis not present

## 2020-10-30 ENCOUNTER — Ambulatory Visit
Admission: RE | Admit: 2020-10-30 | Discharge: 2020-10-30 | Disposition: A | Discharge status: Home / Self Care | Payer: Medicare Other | Source: Ambulatory Visit | Attending: Surgery | Admitting: Surgery

## 2020-10-30 ENCOUNTER — Other Ambulatory Visit: Payer: Self-pay

## 2020-10-30 DIAGNOSIS — C50211 Malignant neoplasm of upper-inner quadrant of right female breast: Secondary | ICD-10-CM | POA: Diagnosis not present

## 2020-10-30 DIAGNOSIS — N6310 Unspecified lump in the right breast, unspecified quadrant: Secondary | ICD-10-CM

## 2020-10-30 DIAGNOSIS — R928 Other abnormal and inconclusive findings on diagnostic imaging of breast: Secondary | ICD-10-CM | POA: Diagnosis not present

## 2020-10-30 IMAGING — MG MM BREAST LOCALIZATION CLIP
4 series · 4 of 12 positions shown · non-contrast
Comparison: Previous exam(s).

CLINICAL DATA: Post procedure mammogram for clip placement.

EXAM:
DIAGNOSTIC RIGHT MAMMOGRAM POST STEREOTACTIC BIOPSY

[R ML synth-2D]
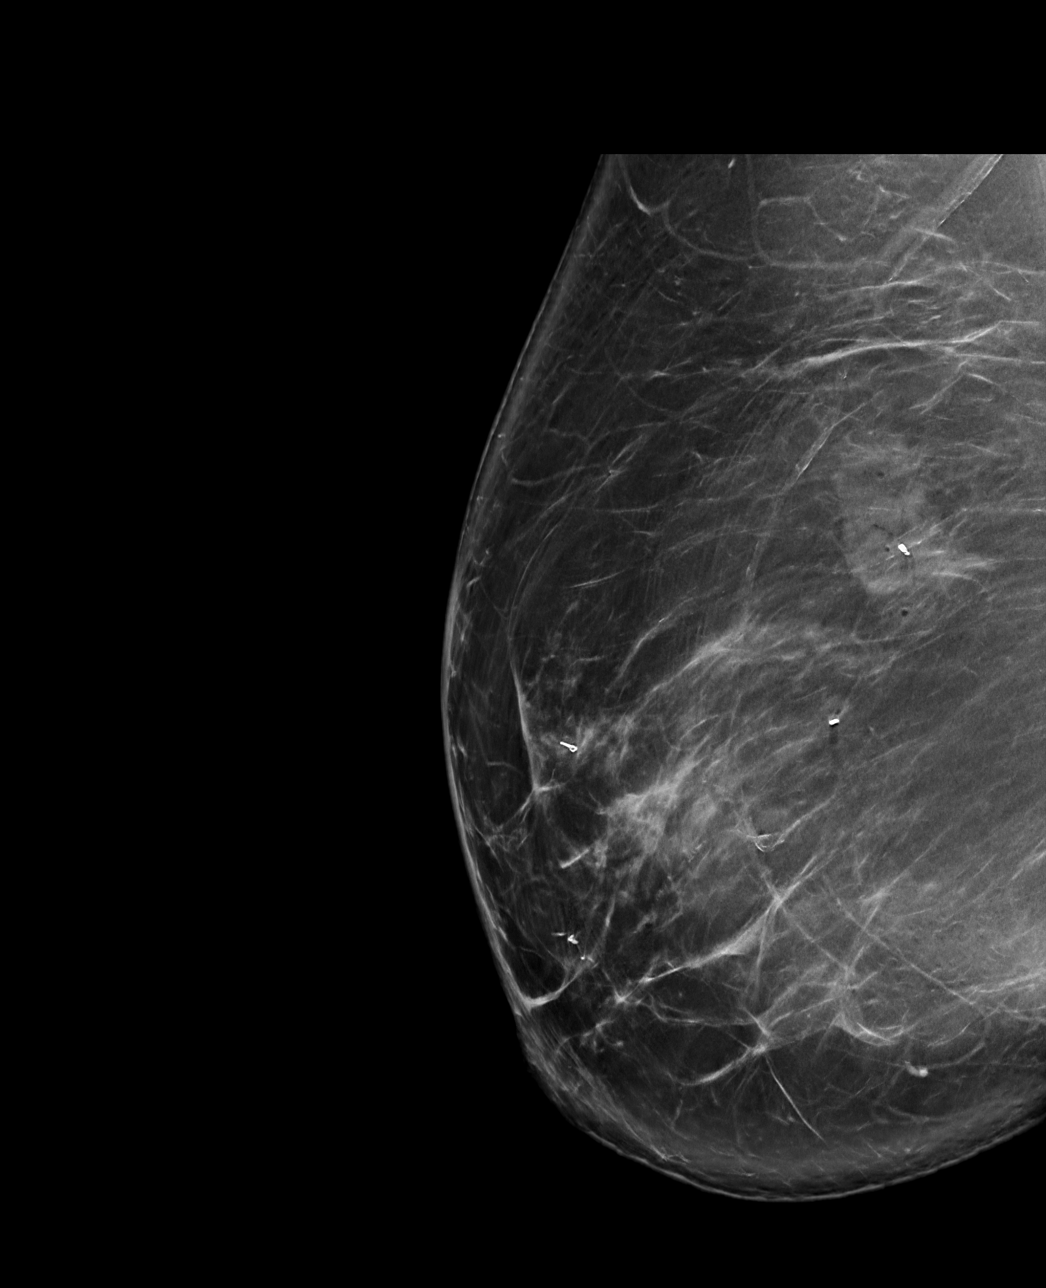

[R CC synth-2D]
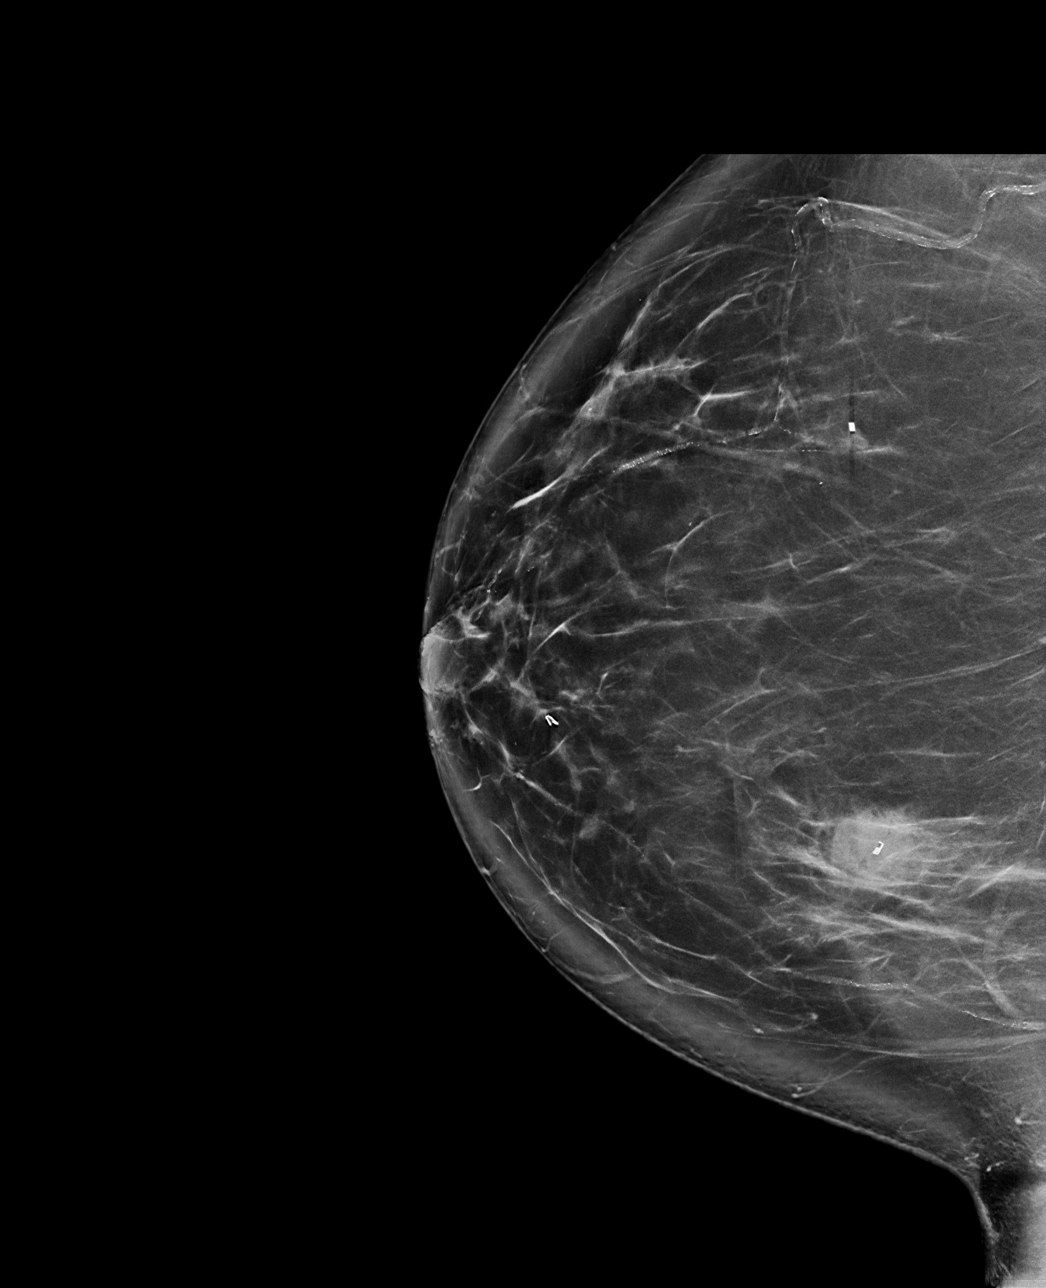

[R ML tomo · tomo slice 51/102.0]
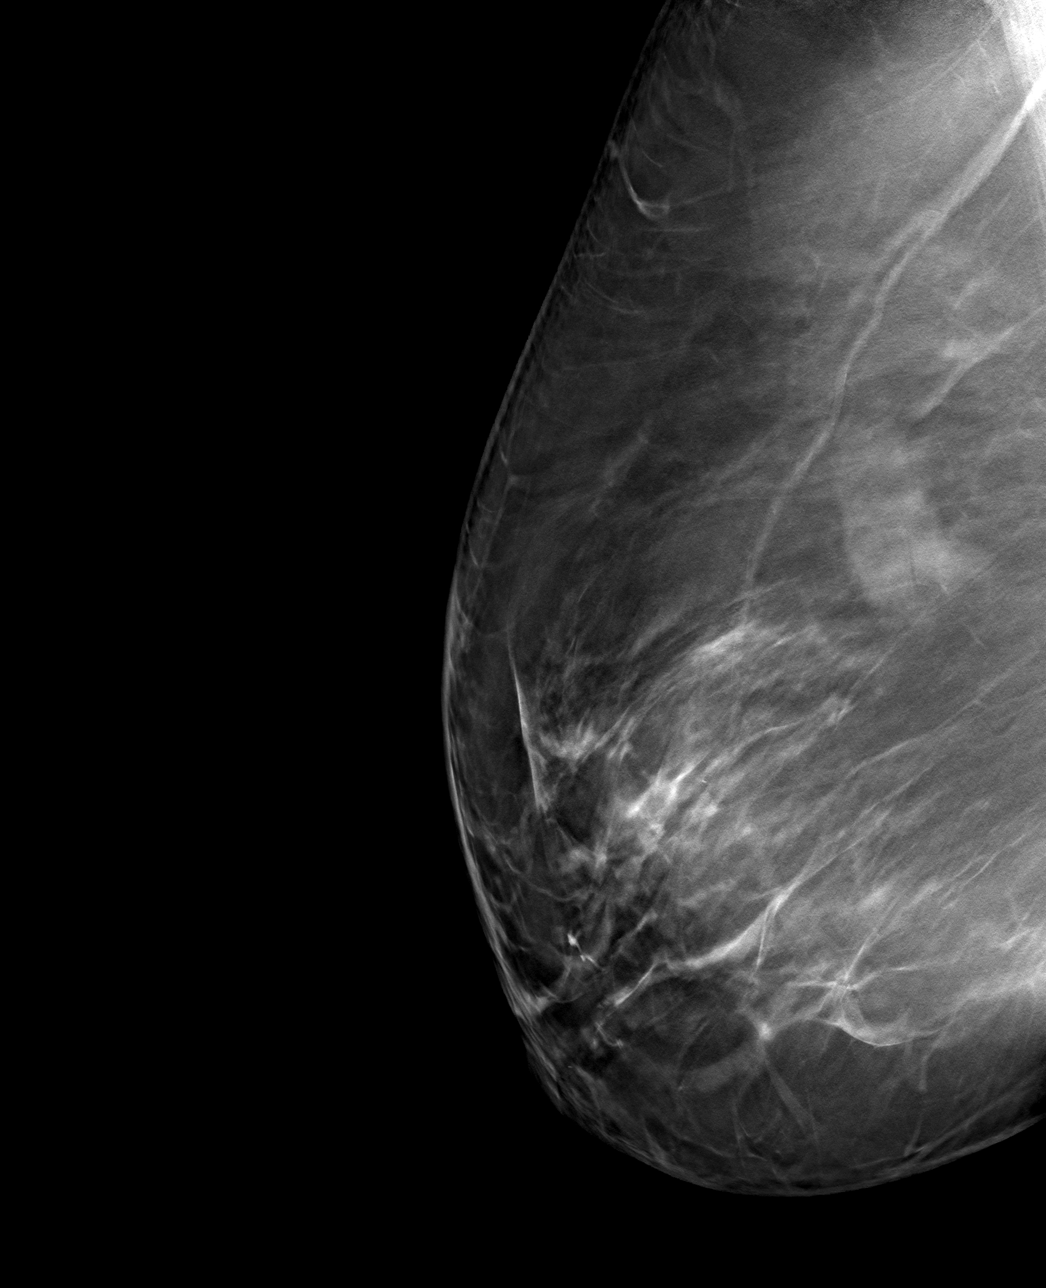

[R CC tomo · tomo slice 49/97.0]
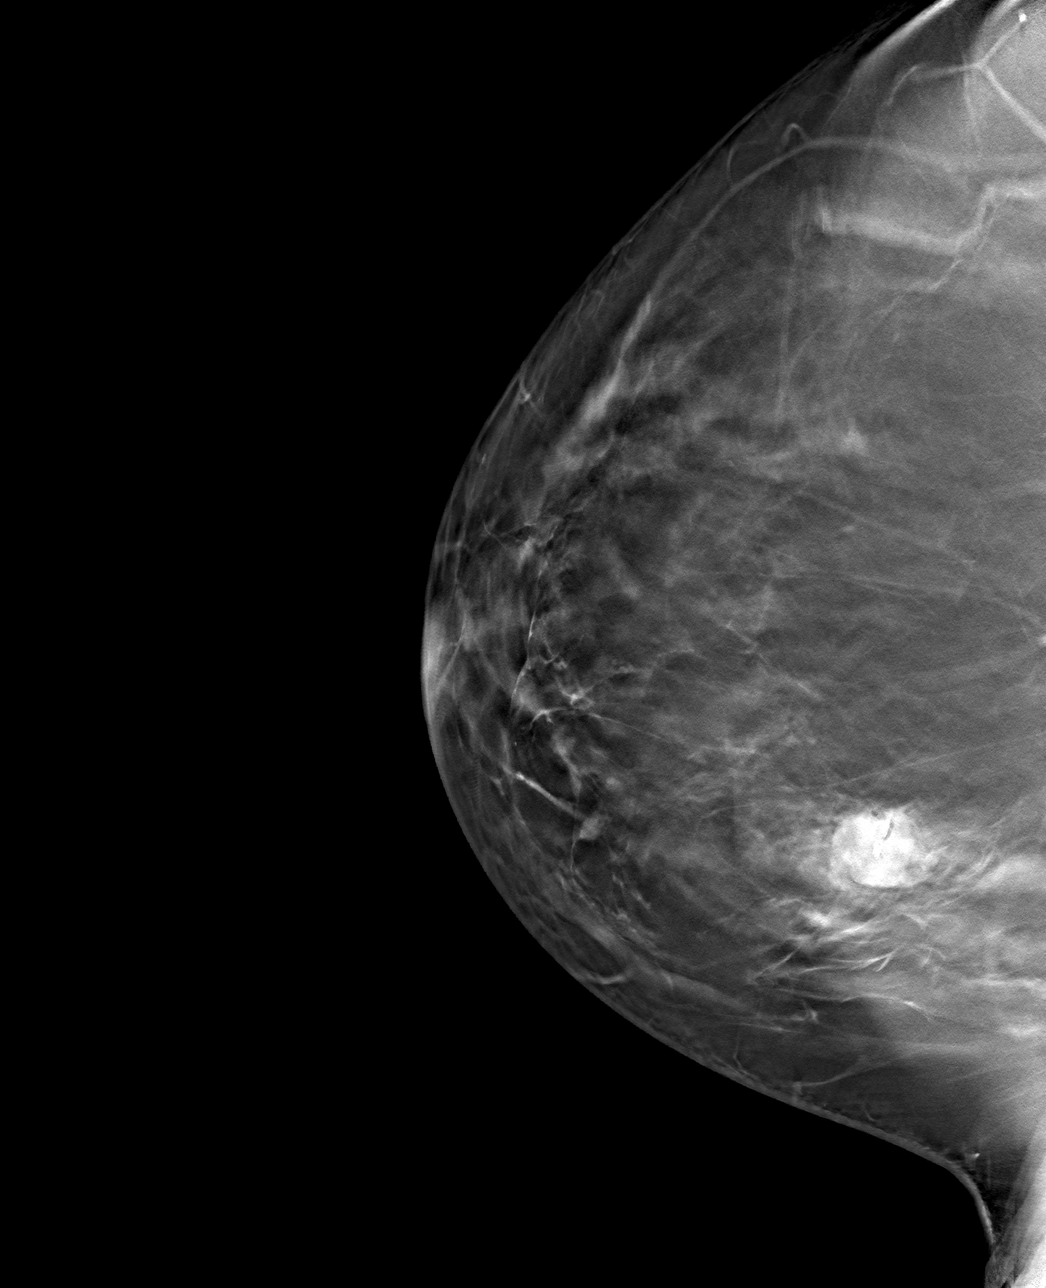

[4 of 12 positions shown; findings below may reference images not displayed]

FINDINGS: Mammographic images were obtained following stereotactic guided
biopsy of an asymmetry in the upper inner right breast. The coil
biopsy marking clip is in expected position at the site of biopsy.
IMPRESSION: Appropriate positioning of the coil shaped biopsy marking clip at
the site of biopsy in the upper inner right breast.

Final Assessment: Post Procedure Mammograms for Marker Placement

## 2020-10-30 IMAGING — MG MM BREAST BX W/ LOC DEV 1ST LESION IMAGE BX SPEC STEREO GUIDE*R*
8 of 18 series · 8 of 40 positions shown · non-contrast
Comparison: Previous exams.
COMPARISON: Previous exams.

Addendum:
CLINICAL DATA: 69-year-old female presenting for biopsy of a right
breast asymmetry. Patient has recently diagnosed right breast
invasive carcinoma x 2.

EXAM:
RIGHT BREAST STEREOTACTIC CORE NEEDLE BIOPSY

[R (1 of 8)]
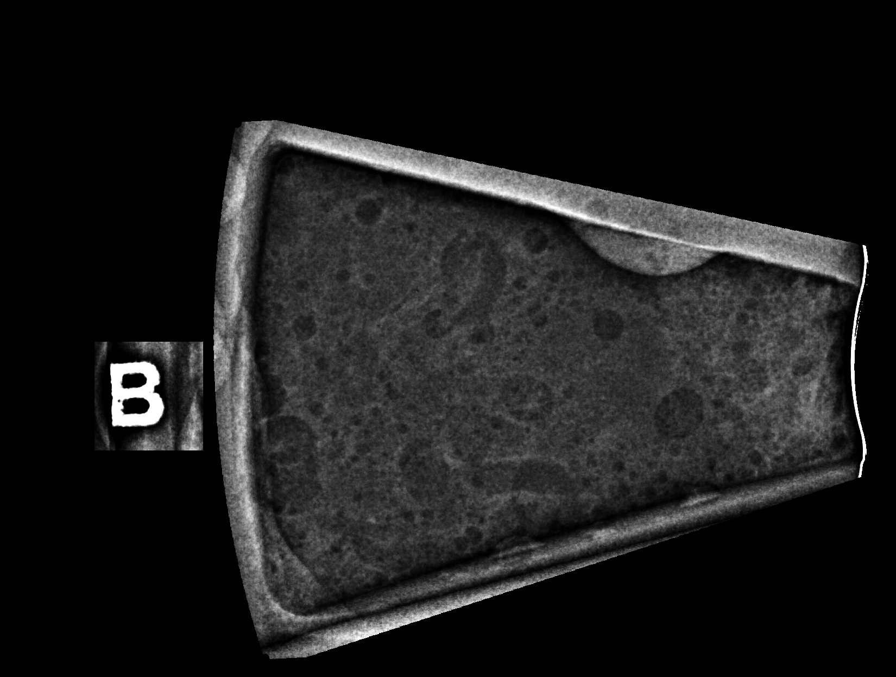

[R (2 of 8)]
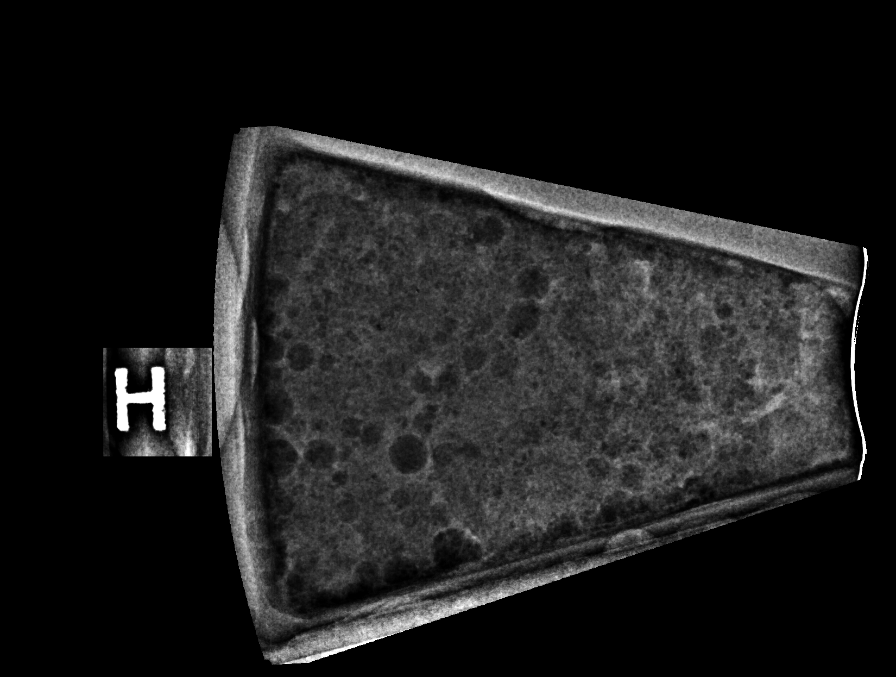

[R (3 of 8)]
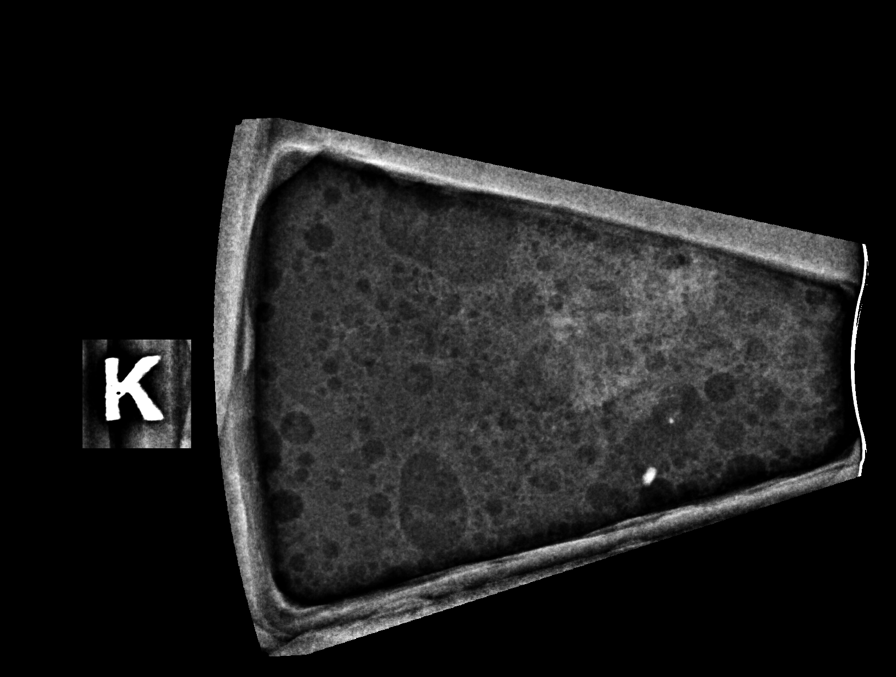

[R (4 of 8)]
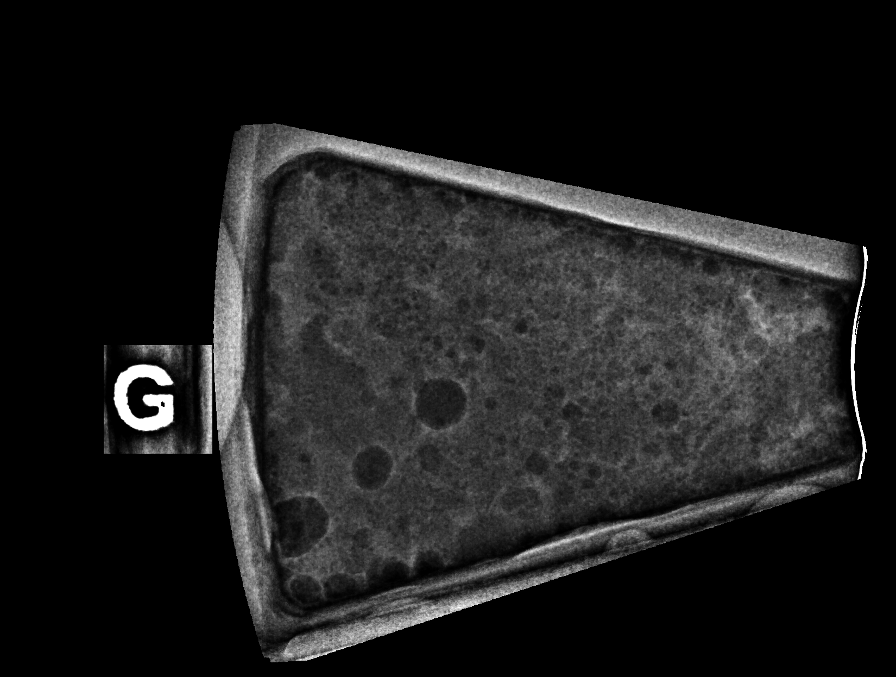

[R (5 of 8)]
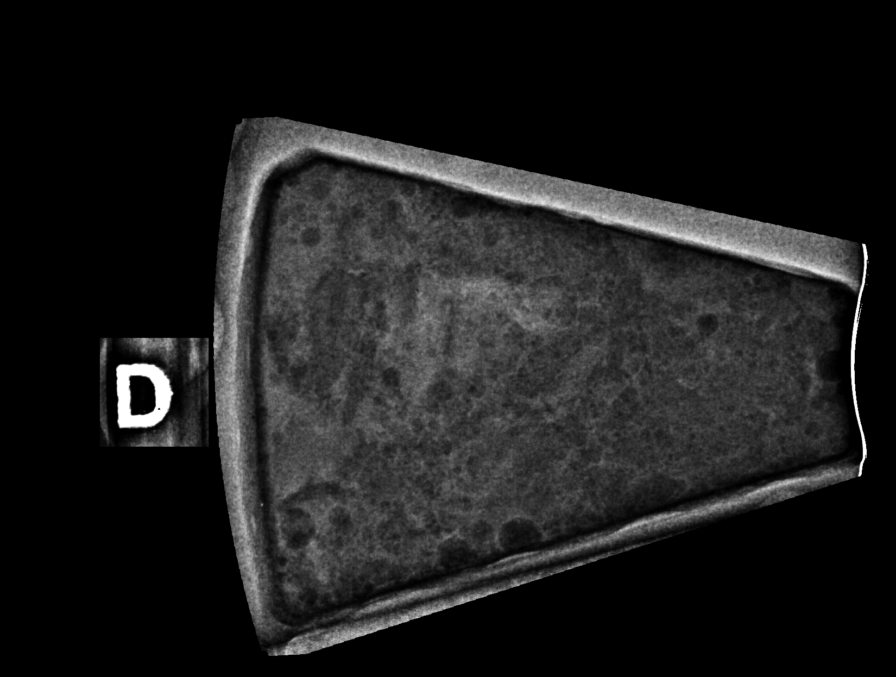

[R (6 of 8)]
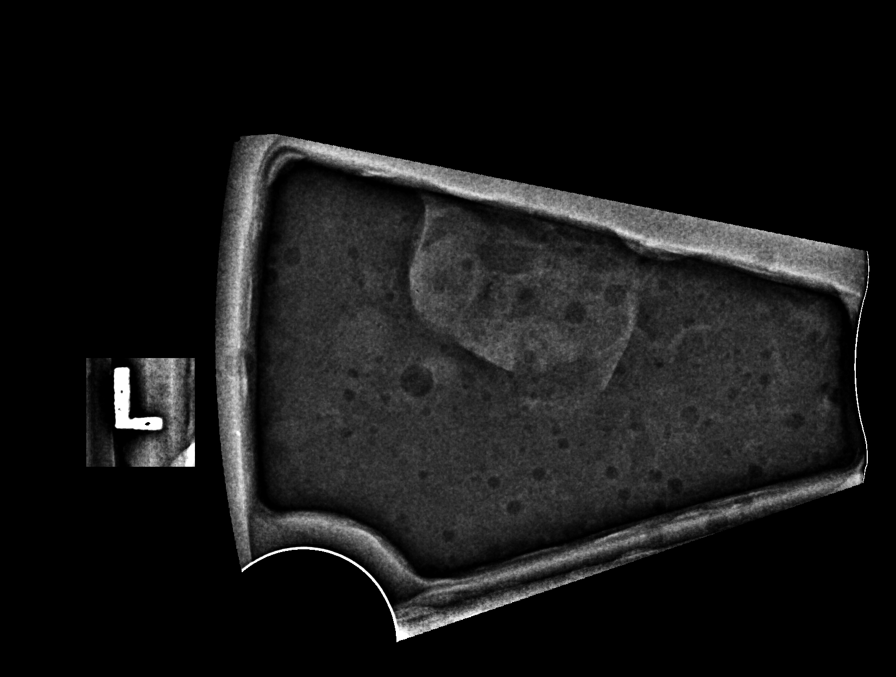

[R (7 of 8)]
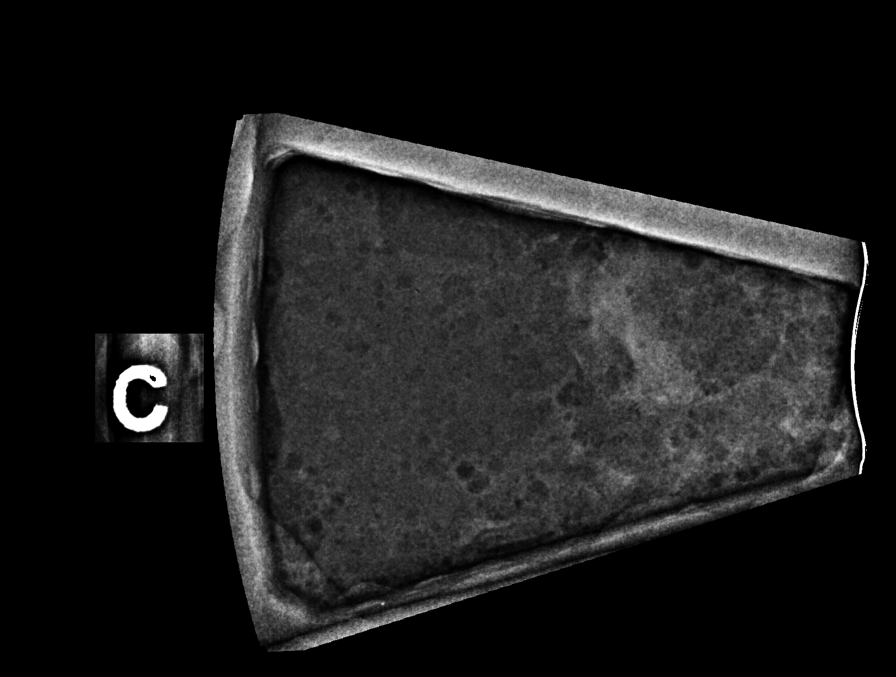

[R (8 of 8)]
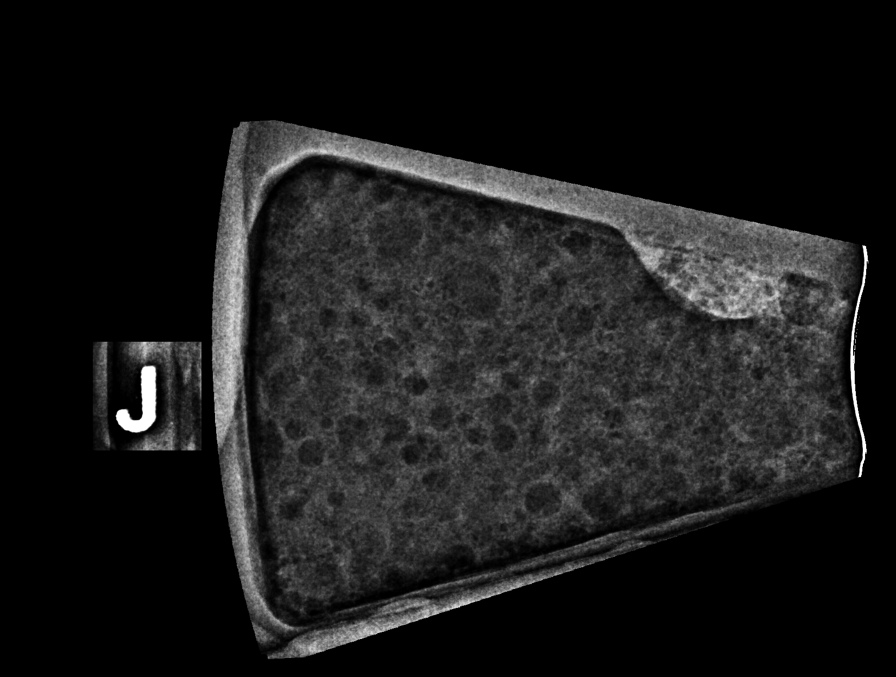

[8 of 40 positions shown; findings below may reference images not displayed]



Using sterile technique and 1% Lidocaine as local anesthetic, under
stereotactic guidance, a 9 gauge vacuum assisted device was used to
perform core needle biopsy of an asymmetry in the upper inner right
breast using a superior approach.

Lesion quadrant: Upper inner quadrant

At the conclusion of the procedure, a coil tissue marker clip was
deployed into the biopsy cavity. Follow-up 2-view mammogram was
performed and dictated separately.

The patient has a small post biopsy hematoma. Compression was held
on the biopsy site and bleeding had completely stopped prior to the
patient being released.
IMPRESSION: Stereotactic-guided biopsy of an asymmetry in the upper inner right
breast. The patient has a small post biopsy hematoma.

ADDENDUM:
Pathology revealed GRADE II INVASIVE DUCTAL CARCINOMA, DUCTAL
CARCINOMA IN SITU of the Right breast, upper inner. This was found
to be concordant by Dr. LOCKLEAR.

Pathology results were discussed with the patient by telephone. The
patient reported doing well after the biopsy with tenderness at the
site. Post biopsy instructions and care were reviewed and questions
were answered. The patient was encouraged to call The [REDACTED] for any additional concerns. My direct phone
number was provided.

The patient has a recent diagnosis of Right breast cancer and should
follow her outlined treatment plan.

Dr. LOCKLEAR was notified of biopsy results via [REDACTED] message
on [DATE].

Pathology results reported by LOCKLEAR, RN on [DATE].



Using sterile technique and 1% Lidocaine as local anesthetic, under
stereotactic guidance, a 9 gauge vacuum assisted device was used to
perform core needle biopsy of an asymmetry in the upper inner right
breast using a superior approach.

Lesion quadrant: Upper inner quadrant

At the conclusion of the procedure, a coil tissue marker clip was
deployed into the biopsy cavity. Follow-up 2-view mammogram was
performed and dictated separately.

The patient has a small post biopsy hematoma. Compression was held
on the biopsy site and bleeding had completely stopped prior to the
patient being released.
IMPRESSION: Stereotactic-guided biopsy of an asymmetry in the upper inner right
breast. The patient has a small post biopsy hematoma.

## 2020-11-04 ENCOUNTER — Ambulatory Visit: Payer: Self-pay | Admitting: Surgery

## 2020-11-07 ENCOUNTER — Other Ambulatory Visit: Payer: Self-pay

## 2020-11-07 ENCOUNTER — Encounter (HOSPITAL_COMMUNITY): Payer: Self-pay | Admitting: Surgery

## 2020-11-07 NOTE — Progress Notes (Signed)
Denies chest pain or shortness of breath. Cardiology visit as noted in [place]. Patient aware of COVID test date/ time. Educated on need to quarantine post COVID test. Educated on visitation policy. States her blood sugars run 100's in AM and never runs low.

## 2020-11-09 ENCOUNTER — Other Ambulatory Visit (HOSPITAL_COMMUNITY)
Admission: RE | Admit: 2020-11-09 | Discharge: 2020-11-09 | Disposition: A | Discharge status: Home / Self Care | Payer: Medicare Other | Source: Ambulatory Visit | Attending: Surgery | Admitting: Surgery

## 2020-11-09 DIAGNOSIS — Z20822 Contact with and (suspected) exposure to covid-19: Secondary | ICD-10-CM | POA: Diagnosis not present

## 2020-11-09 DIAGNOSIS — Z01812 Encounter for preprocedural laboratory examination: Secondary | ICD-10-CM | POA: Diagnosis not present

## 2020-11-09 LAB — SARS CORONAVIRUS 2 (TAT 6-24 HRS): SARS Coronavirus 2: NEGATIVE

## 2020-11-10 ENCOUNTER — Ambulatory Visit (HOSPITAL_COMMUNITY)
Admission: RE | Admit: 2020-11-10 | Discharge: 2020-11-10 | Disposition: A | Discharge status: Home / Self Care | Payer: Medicare Other | Attending: Surgery | Admitting: Surgery

## 2020-11-10 ENCOUNTER — Ambulatory Visit (HOSPITAL_COMMUNITY): Payer: Medicare Other

## 2020-11-10 ENCOUNTER — Encounter (HOSPITAL_COMMUNITY)
Admission: RE | Disposition: A | Discharge status: Home / Self Care | Payer: Self-pay | Source: Home / Self Care | Attending: Surgery

## 2020-11-10 ENCOUNTER — Ambulatory Visit (HOSPITAL_COMMUNITY): Payer: Medicare Other | Admitting: Certified Registered Nurse Anesthetist

## 2020-11-10 ENCOUNTER — Encounter (HOSPITAL_COMMUNITY): Payer: Self-pay | Admitting: Surgery

## 2020-11-10 ENCOUNTER — Other Ambulatory Visit: Payer: Self-pay

## 2020-11-10 DIAGNOSIS — C50911 Malignant neoplasm of unspecified site of right female breast: Secondary | ICD-10-CM | POA: Diagnosis not present

## 2020-11-10 DIAGNOSIS — E119 Type 2 diabetes mellitus without complications: Secondary | ICD-10-CM | POA: Diagnosis not present

## 2020-11-10 DIAGNOSIS — Z419 Encounter for procedure for purposes other than remedying health state, unspecified: Secondary | ICD-10-CM

## 2020-11-10 DIAGNOSIS — Z8049 Family history of malignant neoplasm of other genital organs: Secondary | ICD-10-CM | POA: Diagnosis not present

## 2020-11-10 DIAGNOSIS — Z87891 Personal history of nicotine dependence: Secondary | ICD-10-CM | POA: Diagnosis not present

## 2020-11-10 DIAGNOSIS — Z90711 Acquired absence of uterus with remaining cervical stump: Secondary | ICD-10-CM | POA: Insufficient documentation

## 2020-11-10 DIAGNOSIS — Z95828 Presence of other vascular implants and grafts: Secondary | ICD-10-CM

## 2020-11-10 DIAGNOSIS — Z809 Family history of malignant neoplasm, unspecified: Secondary | ICD-10-CM | POA: Diagnosis not present

## 2020-11-10 DIAGNOSIS — Z803 Family history of malignant neoplasm of breast: Secondary | ICD-10-CM | POA: Diagnosis not present

## 2020-11-10 DIAGNOSIS — Z452 Encounter for adjustment and management of vascular access device: Secondary | ICD-10-CM | POA: Diagnosis not present

## 2020-11-10 DIAGNOSIS — Z7984 Long term (current) use of oral hypoglycemic drugs: Secondary | ICD-10-CM | POA: Diagnosis not present

## 2020-11-10 DIAGNOSIS — I10 Essential (primary) hypertension: Secondary | ICD-10-CM | POA: Diagnosis not present

## 2020-11-10 HISTORY — DX: Type 2 diabetes mellitus without complications: E11.9

## 2020-11-10 HISTORY — DX: Malignant neoplasm of unspecified site of unspecified female breast: C50.919

## 2020-11-10 HISTORY — DX: Anxiety disorder, unspecified: F41.9

## 2020-11-10 HISTORY — DX: Chronic kidney disease, stage 3 unspecified: N18.30

## 2020-11-10 HISTORY — PX: PORTACATH PLACEMENT: SHX2246

## 2020-11-10 LAB — COMPREHENSIVE METABOLIC PANEL
ALT: 22 U/L (ref 0–44)
AST: 23 U/L (ref 15–41)
Albumin: 3.7 g/dL (ref 3.5–5.0)
Alkaline Phosphatase: 97 U/L (ref 38–126)
Anion gap: 13 (ref 5–15)
BUN: 15 mg/dL (ref 8–23)
CO2: 25 mmol/L (ref 22–32)
Calcium: 9.5 mg/dL (ref 8.9–10.3)
Chloride: 100 mmol/L (ref 98–111)
Creatinine, Ser: 0.96 mg/dL (ref 0.44–1.00)
GFR, Estimated: >60 mL/min (ref >60)
Glucose, Bld: 136 mg/dL — ABNORMAL HIGH (ref 70–99)
Potassium: 4.6 mmol/L (ref 3.5–5.1)
Sodium: 138 mmol/L (ref 135–145)
Total Bilirubin: 0.7 mg/dL (ref 0.3–1.2)
Total Protein: 7.7 g/dL (ref 6.5–8.1)

## 2020-11-10 LAB — GLUCOSE, CAPILLARY
Glucose-Capillary: 129 mg/dL — ABNORMAL HIGH (ref 70–99)
Glucose-Capillary: 104 mg/dL — ABNORMAL HIGH (ref 70–99)

## 2020-11-10 LAB — CBC WITH DIFFERENTIAL/PLATELET
Abs Immature Granulocytes: 0.05 10*3/uL (ref 0.00–0.07)
Basophils Absolute: 0 10*3/uL (ref 0.0–0.1)
Basophils Relative: 0 %
Eosinophils Absolute: 0.2 10*3/uL (ref 0.0–0.5)
Eosinophils Relative: 2 %
HCT: 46 % (ref 36.0–46.0)
Hemoglobin: 14.2 g/dL (ref 12.0–15.0)
Immature Granulocytes: 0 %
Lymphocytes Relative: 26 %
Lymphs Abs: 2.9 10*3/uL (ref 0.7–4.0)
MCH: 26.1 pg (ref 26.0–34.0)
MCHC: 30.9 g/dL (ref 30.0–36.0)
MCV: 84.4 fL (ref 80.0–100.0)
Monocytes Absolute: 0.7 10*3/uL (ref 0.1–1.0)
Monocytes Relative: 6 %
Neutro Abs: 7.4 10*3/uL (ref 1.7–7.7)
Neutrophils Relative %: 66 %
Platelets: 300 10*3/uL (ref 150–400)
RBC: 5.45 MIL/uL — ABNORMAL HIGH (ref 3.87–5.11)
RDW: 15.2 % (ref 11.5–15.5)
WBC: 11.4 10*3/uL — ABNORMAL HIGH (ref 4.0–10.5)
nRBC: 0 % (ref 0.0–0.2)

## 2020-11-10 IMAGING — DX DG CHEST 1V PORT
1 series · 1 of 1 positions shown · non-contrast
Comparison: [DATE]

CLINICAL DATA: Port-A-Cath placement

EXAM:
PORTABLE CHEST 1 VIEW

[chest ap]
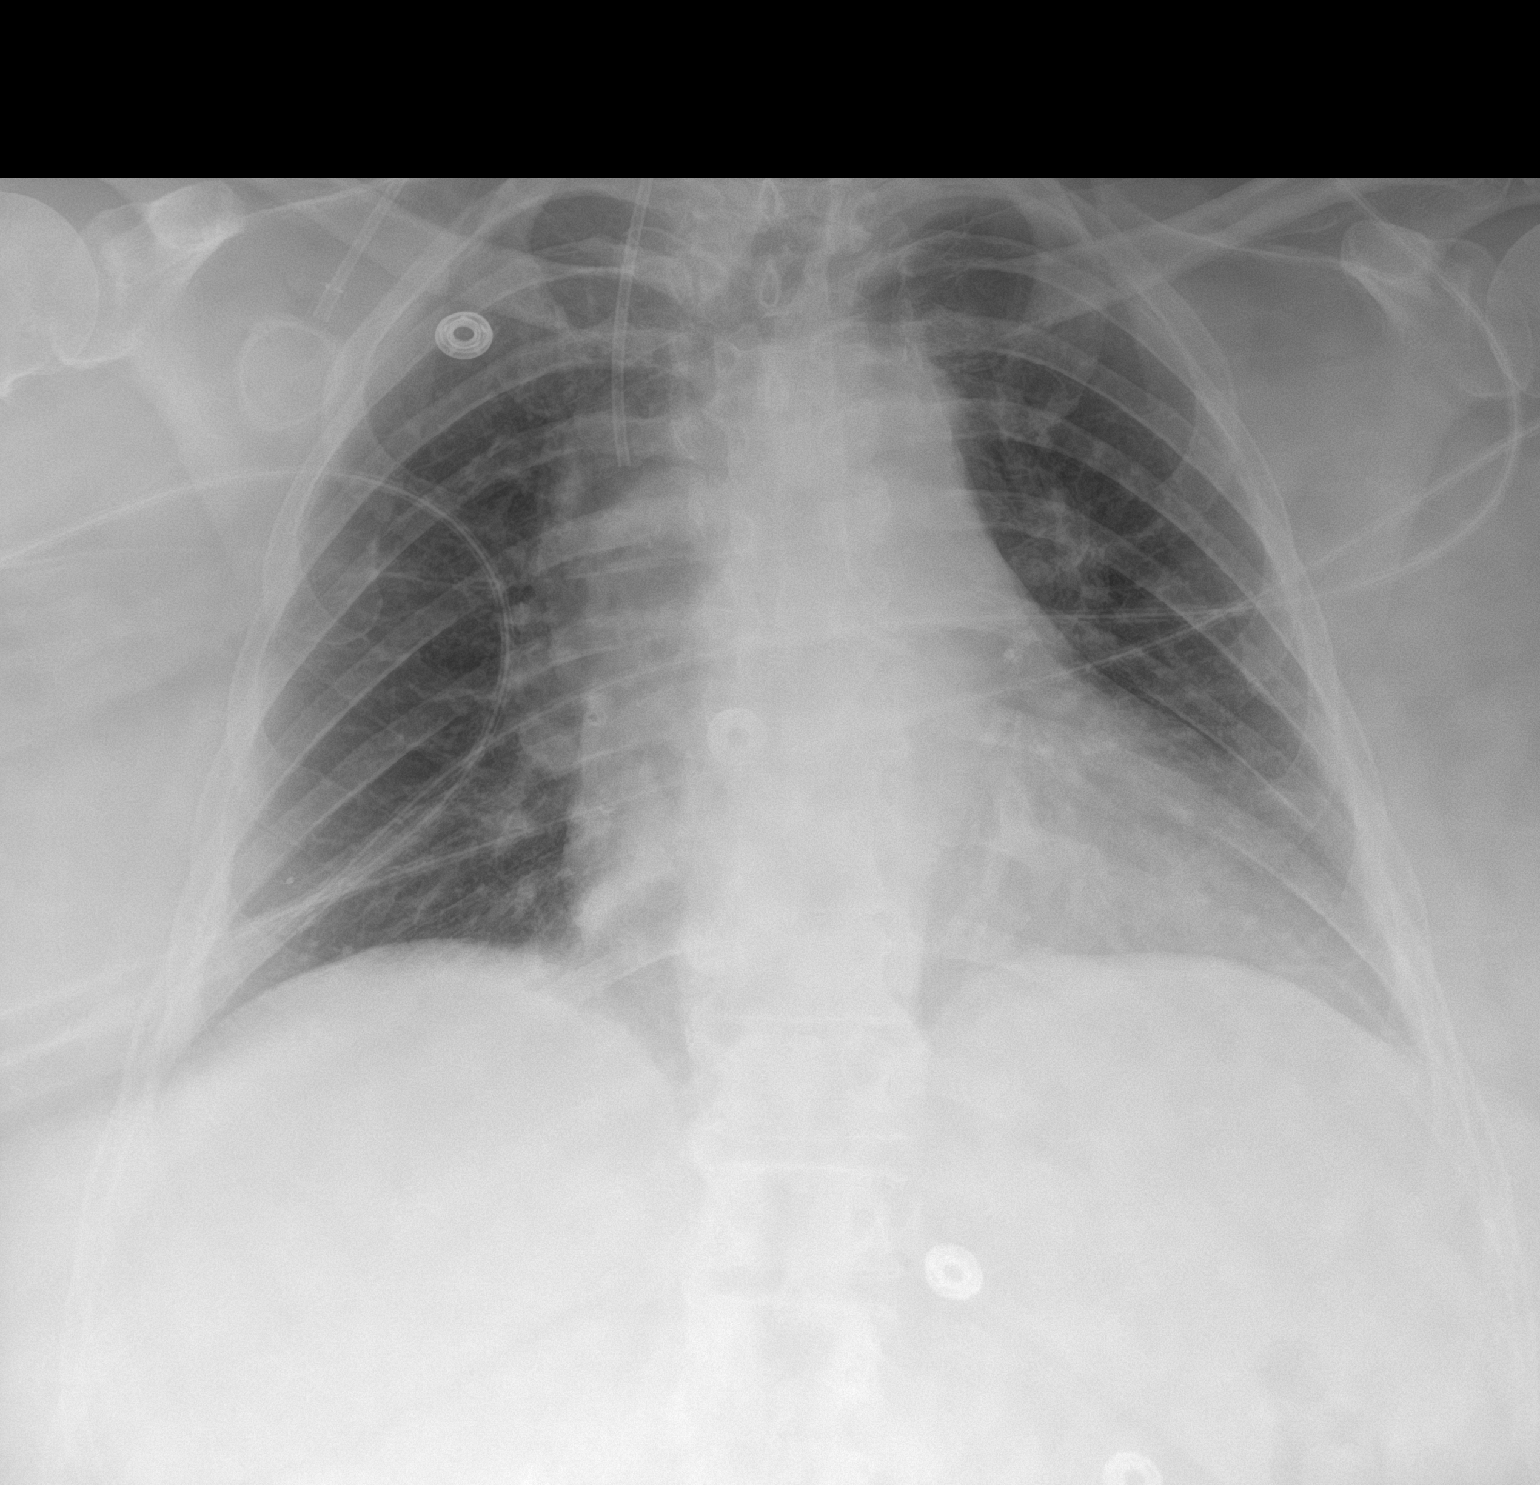

[1 of 1 positions shown; findings below may reference images not displayed]

FINDINGS: Power port placed from a right internal jugular approach has its tip
in the SVC at the azygos level. Entire catheter is not included on
the image, a segment in the lower neck not visible. The lungs are
clear. No pneumothorax.
IMPRESSION: Power port positioned with tip in the SVC at the azygos level. No
pneumothorax.

## 2020-11-10 SURGERY — INSERTION, TUNNELED CENTRAL VENOUS DEVICE, WITH PORT
Anesthesia: General

## 2020-11-10 MED ORDER — CHLORHEXIDINE GLUCONATE 0.12 % MT SOLN: 15.0000 mL | Freq: Once | OROMUCOSAL | Status: AC

## 2020-11-10 MED ORDER — ACETAMINOPHEN 500 MG PO TABS
1000.0000 mg | ORAL_TABLET | Freq: Once | ORAL | Status: AC
  Administered 2020-11-10: 1000 mg via ORAL
  Filled 2020-11-10: qty 2

## 2020-11-10 MED ORDER — MIDAZOLAM HCL 2 MG/2ML IJ SOLN
INTRAMUSCULAR | Status: DC | PRN
  Administered 2020-11-10: 1 mg via INTRAVENOUS

## 2020-11-10 MED ORDER — CHLORHEXIDINE GLUCONATE 0.12 % MT SOLN
OROMUCOSAL | Status: AC
  Administered 2020-11-10: 15 mL via OROMUCOSAL
  Filled 2020-11-10: qty 15

## 2020-11-10 MED ORDER — ONDANSETRON HCL 4 MG/2ML IJ SOLN
INTRAMUSCULAR | Status: DC | PRN
  Administered 2020-11-10: 4 mg via INTRAVENOUS

## 2020-11-10 MED ORDER — PHENYLEPHRINE 40 MCG/ML (10ML) SYRINGE FOR IV PUSH (FOR BLOOD PRESSURE SUPPORT)
PREFILLED_SYRINGE | INTRAVENOUS | Status: AC
  Filled 2020-11-10: qty 10

## 2020-11-10 MED ORDER — ONDANSETRON HCL 4 MG/2ML IJ SOLN
INTRAMUSCULAR | Status: AC
  Filled 2020-11-10: qty 2

## 2020-11-10 MED ORDER — PROPOFOL 10 MG/ML IV BOLUS
INTRAVENOUS | Status: DC | PRN
  Administered 2020-11-10: 150 mg via INTRAVENOUS

## 2020-11-10 MED ORDER — 0.9 % SODIUM CHLORIDE (POUR BTL) OPTIME
TOPICAL | Status: DC | PRN
  Administered 2020-11-10: 1000 mL

## 2020-11-10 MED ORDER — MIDAZOLAM HCL 2 MG/2ML IJ SOLN
INTRAMUSCULAR | Status: AC
  Filled 2020-11-10: qty 2

## 2020-11-10 MED ORDER — CHLORHEXIDINE GLUCONATE CLOTH 2 % EX PADS: 6.0000 | MEDICATED_PAD | Freq: Once | CUTANEOUS | Status: DC

## 2020-11-10 MED ORDER — HEMOSTATIC AGENTS (NO CHARGE) OPTIME
TOPICAL | Status: DC | PRN
  Administered 2020-11-10: 1 via TOPICAL

## 2020-11-10 MED ORDER — PROPOFOL 10 MG/ML IV BOLUS
INTRAVENOUS | Status: AC
  Filled 2020-11-10: qty 20

## 2020-11-10 MED ORDER — DEXTROSE 5 % IV SOLN
3.0000 g | INTRAVENOUS | Status: AC
  Administered 2020-11-10: 3 g via INTRAVENOUS
  Filled 2020-11-10: qty 3000

## 2020-11-10 MED ORDER — FENTANYL CITRATE (PF) 250 MCG/5ML IJ SOLN
INTRAMUSCULAR | Status: AC
  Filled 2020-11-10: qty 5

## 2020-11-10 MED ORDER — LACTATED RINGERS IV SOLN: INTRAVENOUS | Status: DC

## 2020-11-10 MED ORDER — DEXAMETHASONE SODIUM PHOSPHATE 10 MG/ML IJ SOLN
INTRAMUSCULAR | Status: DC | PRN
  Administered 2020-11-10: 10 mg via INTRAVENOUS

## 2020-11-10 MED ORDER — SODIUM CHLORIDE 0.9 % IV SOLN: INTRAVENOUS | Status: DC | PRN

## 2020-11-10 MED ORDER — HEPARIN SOD (PORK) LOCK FLUSH 100 UNIT/ML IV SOLN
INTRAVENOUS | Status: AC
  Filled 2020-11-10: qty 5

## 2020-11-10 MED ORDER — OXYCODONE HCL 5 MG PO TABS: 5.0000 mg | ORAL_TABLET | Freq: Four times a day (QID) | ORAL | 0 refills | Status: DC | PRN

## 2020-11-10 MED ORDER — SODIUM CHLORIDE 0.9 % IV SOLN
INTRAVENOUS | Status: AC
  Filled 2020-11-10: qty 1.2

## 2020-11-10 MED ORDER — LIDOCAINE 2% (20 MG/ML) 5 ML SYRINGE
INTRAMUSCULAR | Status: DC | PRN
  Administered 2020-11-10: 80 mg via INTRAVENOUS

## 2020-11-10 MED ORDER — LIDOCAINE 2% (20 MG/ML) 5 ML SYRINGE
INTRAMUSCULAR | Status: AC
  Filled 2020-11-10: qty 5

## 2020-11-10 MED ORDER — CELECOXIB 200 MG PO CAPS: 200.0000 mg | ORAL_CAPSULE | Freq: Once | ORAL | Status: DC

## 2020-11-10 MED ORDER — ORAL CARE MOUTH RINSE: 15.0000 mL | Freq: Once | OROMUCOSAL | Status: AC

## 2020-11-10 MED ORDER — DEXAMETHASONE SODIUM PHOSPHATE 10 MG/ML IJ SOLN
INTRAMUSCULAR | Status: AC
  Filled 2020-11-10: qty 1

## 2020-11-10 MED ORDER — BUPIVACAINE HCL (PF) 0.25 % IJ SOLN
INTRAMUSCULAR | Status: AC
  Filled 2020-11-10: qty 30

## 2020-11-10 MED ORDER — FENTANYL CITRATE (PF) 250 MCG/5ML IJ SOLN
INTRAMUSCULAR | Status: DC | PRN
  Administered 2020-11-10 (×2): 25 ug via INTRAVENOUS
  Administered 2020-11-10: 50 ug via INTRAVENOUS
  Administered 2020-11-10 (×2): 25 ug via INTRAVENOUS

## 2020-11-10 MED ORDER — HEPARIN SOD (PORK) LOCK FLUSH 100 UNIT/ML IV SOLN
INTRAVENOUS | Status: DC | PRN
  Administered 2020-11-10: 440 [IU] via INTRAVENOUS

## 2020-11-10 MED ORDER — PHENYLEPHRINE 40 MCG/ML (10ML) SYRINGE FOR IV PUSH (FOR BLOOD PRESSURE SUPPORT)
PREFILLED_SYRINGE | INTRAVENOUS | Status: DC | PRN
  Administered 2020-11-10 (×5): 80 ug via INTRAVENOUS

## 2020-11-10 MED ORDER — BUPIVACAINE HCL (PF) 0.25 % IJ SOLN
INTRAMUSCULAR | Status: DC | PRN
  Administered 2020-11-10: 5 mL
  Administered 2020-11-10: 10 mL

## 2020-11-10 SURGICAL SUPPLY — 40 items
BAG DECANTER FOR FLEXI CONT (MISCELLANEOUS) ×2 IMPLANT
CHLORAPREP W/TINT 26 (MISCELLANEOUS) ×2 IMPLANT
COVER SURGICAL LIGHT HANDLE (MISCELLANEOUS) ×2 IMPLANT
COVER TRANSDUCER ULTRASND GEL (DISPOSABLE) ×2 IMPLANT
COVER WAND RF STERILE (DRAPES) ×2 IMPLANT
DERMABOND ADVANCED (GAUZE/BANDAGES/DRESSINGS) ×1
DERMABOND ADVANCED .7 DNX12 (GAUZE/BANDAGES/DRESSINGS) ×1 IMPLANT
DRAPE C-ARM 42X120 X-RAY (DRAPES) ×2 IMPLANT
ELECT CAUTERY BLADE 6.4 (BLADE) ×2 IMPLANT
ELECT REM PT RETURN 9FT ADLT (ELECTROSURGICAL) ×2
ELECTRODE REM PT RTRN 9FT ADLT (ELECTROSURGICAL) ×1 IMPLANT
GAUZE 4X4 16PLY RFD (DISPOSABLE) ×2 IMPLANT
GEL ULTRASOUND 20GR AQUASONIC (MISCELLANEOUS) IMPLANT
GLOVE BIO SURGEON STRL SZ8 (GLOVE) ×2 IMPLANT
GLOVE SRG 8 PF TXTR STRL LF DI (GLOVE) ×1 IMPLANT
GLOVE SURG UNDER POLY LF SZ8 (GLOVE) ×2
GOWN STRL REUS W/ TWL LRG LVL3 (GOWN DISPOSABLE) ×1 IMPLANT
GOWN STRL REUS W/ TWL XL LVL3 (GOWN DISPOSABLE) ×1 IMPLANT
GOWN STRL REUS W/TWL LRG LVL3 (GOWN DISPOSABLE) ×2
GOWN STRL REUS W/TWL XL LVL3 (GOWN DISPOSABLE) ×2
HEMOSTAT SNOW SURGICEL 2X4 (HEMOSTASIS) ×2 IMPLANT
INTRODUCER COOK 11FR (CATHETERS) IMPLANT
KIT BASIN OR (CUSTOM PROCEDURE TRAY) ×2 IMPLANT
KIT PORT POWER 8FR ISP CVUE (Port) ×2 IMPLANT
KIT TURNOVER KIT B (KITS) ×2 IMPLANT
NS IRRIG 1000ML POUR BTL (IV SOLUTION) ×2 IMPLANT
PAD ARMBOARD 7.5X6 YLW CONV (MISCELLANEOUS) ×2 IMPLANT
PENCIL BUTTON HOLSTER BLD 10FT (ELECTRODE) ×2 IMPLANT
POSITIONER HEAD DONUT 9IN (MISCELLANEOUS) ×2 IMPLANT
SET INTRODUCER 12FR PACEMAKER (INTRODUCER) IMPLANT
SET SHEATH INTRODUCER 10FR (MISCELLANEOUS) IMPLANT
SHEATH COOK PEEL AWAY SET 9F (SHEATH) IMPLANT
SUT MNCRL AB 4-0 PS2 18 (SUTURE) ×2 IMPLANT
SUT PROLENE 2 0 SH 30 (SUTURE) ×2 IMPLANT
SUT VIC AB 3-0 SH 27 (SUTURE) ×2
SUT VIC AB 3-0 SH 27X BRD (SUTURE) ×1 IMPLANT
SYR 5ML LUER SLIP (SYRINGE) ×2 IMPLANT
TOWEL GREEN STERILE (TOWEL DISPOSABLE) ×2 IMPLANT
TOWEL GREEN STERILE FF (TOWEL DISPOSABLE) ×2 IMPLANT
TRAY LAPAROSCOPIC MC (CUSTOM PROCEDURE TRAY) ×2 IMPLANT

## 2020-11-10 NOTE — Interval H&P Note (Signed)
History and Physical Interval Note:  11/10/2020 11:48 AM  Beth Phelps  has presented today for surgery, with the diagnosis of RIGHT BREAST CANCER.  The various methods of treatment have been discussed with the patient and family. After consideration of risks, benefits and other options for treatment, the patient has consented to  Procedure(s): INSERTION PORT-A-CATH WITH ULTRASOUND GUIDANCE (N/A) as a surgical intervention.  The patient's history has been reviewed, patient examined, no change in status, stable for surgery.  I have reviewed the patient's chart and labs.  Questions were answered to the patient's satisfaction. Risk of bleeding, infection, collapsed lung, bleeding around lung, mediastinal injury, cardiac injury, nerve injury, major blood vessel injury, death, DVT, catheter migration, catheter infection, catheter embolization, and the need further treatments and/or procedures.   Turner Daniels MD

## 2020-11-10 NOTE — Op Note (Signed)
Preoperative diagnosis: PAC needed  Postoperative diagnosis: Same  Procedure: Portacath Placement with C arm and U/S   Surgeon: Turner Daniels, MD, FACS  Anesthesia: General and 0.25 % marcaine with epinephrine  Clinical History and Indications: The patient is getting ready to begin chemotherapy for her cancer. She  needs a Port-A-Cath for venous access. Risk of bleeding, infection,  Collapse lung,  Death,  DVT,  Organ injury,  Mediastinal injury,  Injury to heart,  Injury to blood vessels,  Nerves,  Migration of catheter,  Embolization of catheter and the need for more surgery.  Description of Procedure: I have seen the patient in the holding area and confirmed the plans for the procedure as noted above. I reviewed the risks and complications again and the patient has no further questions. She wishes to proceed.   The patient was then taken to the operating room. After satisfactory general  anesthesia had been obtained the upper chest and lower neck were prepped and draped as a sterile field. The timeout was done.  The right internal jugular vein  was entered under U/S guidance  and the guidewire threaded into the superior vena cava right atrial area under fluoroscopic guidance. An incision was then made on the anterior chest wall and a subcutaneous pocket fashioned for the port reservoir.  The port tubing was then brought through a subcutaneous tunnel from the port site to the guidewire site.  The port and catheter were attached, locked  and flushed. The catheter was measured and cut to appropriate length.The dilator and peel-away sheath were then advanced over the guidewire while monitoring this with fluoroscopy. The guidewire and dilator were removed and the tubing threaded to approximately 21 cm. The peel-away sheath was then removed. The catheter aspirated and flushed easily. Using fluoroscopy the tip was in the superior vena cava right atrial junction area. It aspirated and flushed  easily. That aspirated and flushed easily.  The reservoir was secured to the fascia with 1 sutures of 2-0 Prolene. A final check with fluoroscopy was done to make sure we had no kinks and good positioning of the tip of the catheter. Everything appeared to be okay. The catheter was aspirated, flushed with dilute heparin and then concentrated aqueous heparin.  The incision was then closed with interrupted 3-0 Vicryl, and 4-0 Monocryl subcuticular with Dermabond on the skin.  There were no operative complications. Estimated blood loss was minimal. All counts were correct. The patient tolerated the procedure well.  Turner Daniels, MD, FACS

## 2020-11-10 NOTE — Anesthesia Procedure Notes (Signed)
Procedure Name: LMA Insertion Date/Time: 11/10/2020 12:26 PM Performed by: Lance Coon, CRNA Pre-anesthesia Checklist: Patient identified, Emergency Drugs available, Suction available and Patient being monitored Patient Re-evaluated:Patient Re-evaluated prior to induction Oxygen Delivery Method: Circle System Utilized Preoxygenation: Pre-oxygenation with 100% oxygen Induction Type: IV induction Ventilation: Mask ventilation without difficulty LMA: LMA inserted LMA Size: 3.0 Number of attempts: 1 Placement Confirmation: positive ETCO2 Tube secured with: Tape Dental Injury: Teeth and Oropharynx as per pre-operative assessment

## 2020-11-10 NOTE — Transfer of Care (Signed)
Immediate Anesthesia Transfer of Care Note  Patient: [facility] Que [facility]  Procedure(s) Performed: INSERTION PORT-A-CATH WITH ULTRASOUND GUIDANCE (N/A )  Patient Location: PACU  Anesthesia Type:General  Level of Consciousness: awake, alert  and patient cooperative  Airway & Oxygen Therapy: Patient Spontanous Breathing  Post-op Assessment: Report given to RN and Post -op Vital signs reviewed and stable  Post vital signs: Reviewed and stable  Last Vitals:  Vitals Value Taken Time  BP 159/72 11/10/20 1347  Temp    Pulse 66 11/10/20 1348  Resp 19 11/10/20 1348  SpO2 96 % 11/10/20 1348  Vitals shown include unvalidated device data.  Last Pain:  Vitals:   11/10/20 1021  TempSrc: Oral  PainSc: 0-No pain         Complications: No complications documented.

## 2020-11-10 NOTE — Anesthesia Preprocedure Evaluation (Signed)
Anesthesia Evaluation  Patient identified by MRN, date of birth, ID band Patient awake    Reviewed: Allergy & Precautions, NPO status , Patient's Chart, lab work & pertinent test results  Airway Mallampati: III  TM Distance: >3 FB     Dental  (+) Dental Advisory Given   Pulmonary sleep apnea , former smoker,    breath sounds clear to auscultation       Cardiovascular hypertension, Pt. on medications and Pt. on home beta blockers  Rhythm:Regular Rate:Normal     Neuro/Psych  Headaches,    GI/Hepatic Neg liver ROS, hiatal hernia, GERD  ,  Endo/Other  diabetes, Type 2, Oral Hypoglycemic Agents  Renal/GU Renal disease     Musculoskeletal   Abdominal   Peds  Hematology negative hematology ROS (+)   Anesthesia Other Findings   Reproductive/Obstetrics                             Anesthesia Physical Anesthesia Plan  ASA: III  Anesthesia Plan: General   Post-op Pain Management:    Induction: Intravenous  PONV Risk Score and Plan: 3 and Ondansetron, Dexamethasone and Treatment may vary due to age or medical condition  Airway Management Planned: LMA  Additional Equipment:   Intra-op Plan:   Post-operative Plan: Extubation in OR  Informed Consent: I have reviewed the patients History and Physical, chart, labs and discussed the procedure including the risks, benefits and alternatives for the proposed anesthesia with the patient or authorized representative who has indicated his/her understanding and acceptance.     Dental advisory given  Plan Discussed with: CRNA  Anesthesia Plan Comments:         Anesthesia Quick Evaluation

## 2020-11-10 NOTE — Discharge Instructions (Signed)
    PORT-A-CATH: POST OP INSTRUCTIONS  Always review your discharge instruction sheet given to you by the facility where your surgery was performed.   1. A prescription for pain medication may be given to you upon discharge. Take your pain medication as prescribed, if needed. If narcotic pain medicine is not needed, then you make take acetaminophen (Tylenol) or ibuprofen (Advil) as needed.  2. Take your usually prescribed medications unless otherwise directed. 3. If you need a refill on your pain medication, please contact our office. All narcotic pain medicine now requires a paper prescription.  Phoned in and fax refills are no longer allowed by law.  Prescriptions will not be filled after 5 pm or on weekends.  4. You should follow a light diet for the remainder of the day after your procedure. 5. Most patients will experience some mild swelling and/or bruising in the area of the incision. It may take several days to resolve. 6. It is common to experience some constipation if taking pain medication after surgery. Increasing fluid intake and taking a stool softener (such as Colace) will usually help or prevent this problem from occurring. A mild laxative (Milk of Magnesia or Miralax) should be taken according to package directions if there are no bowel movements after 48 hours.  7. Unless discharge instructions indicate otherwise, you may remove your bandages 48 hours after surgery, and you may shower at that time. You may have steri-strips (small white skin tapes) in place directly over the incision.  These strips should be left on the skin for 7-10 days.  If your surgeon used Dermabond (skin glue) on the incision, you may shower in 24 hours.  The glue will flake off over the next 2-3 weeks.  8. If your port is left accessed at the end of surgery (needle left in port), the dressing cannot get wet and should only by changed by a healthcare professional. When the port is no longer accessed (when the  needle has been removed), follow step 7.   9. ACTIVITIES:  Limit activity involving your arms for the next 72 hours. Do no strenuous exercise or activity for 1 week. You may drive when you are no longer taking prescription pain medication, you can comfortably wear a seatbelt, and you can maneuver your car. 10.You may need to see your doctor in the office for a follow-up appointment.  Please       check with your doctor.  11.When you receive a new Port-a-Cath, you will get a product guide and        ID card.  Please keep them in case you need them.  WHEN TO CALL YOUR DOCTOR ([phone]): 1. Fever over 101.0 2. Chills 3. Continued bleeding from incision 4. Increased redness and tenderness at the site 5. Shortness of breath, difficulty breathing   The clinic staff is available to answer your questions during regular business hours. Please don't hesitate to call and ask to speak to one of the nurses or medical assistants for clinical concerns. If you have a medical emergency, go to the nearest emergency room or call 911.  A surgeon from [facility] is always on call at the hospital.     For further information, please visit [email or web address]      

## 2020-11-11 ENCOUNTER — Encounter (HOSPITAL_COMMUNITY): Payer: Self-pay | Admitting: Surgery

## 2020-11-11 NOTE — Anesthesia Postprocedure Evaluation (Signed)
Anesthesia Post Note  Patient: Vita Currin [facility]  Procedure(s) Performed: INSERTION PORT-A-CATH WITH ULTRASOUND GUIDANCE (N/A )     Patient location during evaluation: PACU Anesthesia Type: General Level of consciousness: awake and alert Pain management: pain level controlled Vital Signs Assessment: post-procedure vital signs reviewed and stable Respiratory status: spontaneous breathing, nonlabored ventilation, respiratory function stable and patient connected to nasal cannula oxygen Cardiovascular status: blood pressure returned to baseline and stable Postop Assessment: no apparent nausea or vomiting Anesthetic complications: no   No complications documented.  Last Vitals:  Vitals:   11/10/20 1418 11/10/20 1426  BP: (!) 150/69 (!) 164/77  Pulse: 62 66  Resp: (!) 23 (!) 21  Temp:  36.4 C  SpO2: 92% 94%    Last Pain:  Vitals:   11/10/20 1426  TempSrc:   PainSc: 0-No pain                 Tiajuana Amass

## 2020-11-12 DIAGNOSIS — Z1589 Genetic susceptibility to other disease: Secondary | ICD-10-CM | POA: Insufficient documentation

## 2020-11-13 DIAGNOSIS — C50811 Malignant neoplasm of overlapping sites of right female breast: Secondary | ICD-10-CM | POA: Diagnosis not present

## 2020-11-13 DIAGNOSIS — Z170 Estrogen receptor positive status [ER+]: Secondary | ICD-10-CM | POA: Diagnosis not present

## 2020-11-13 DIAGNOSIS — D1801 Hemangioma of skin and subcutaneous tissue: Secondary | ICD-10-CM | POA: Diagnosis not present

## 2020-11-16 DIAGNOSIS — I517 Cardiomegaly: Secondary | ICD-10-CM | POA: Diagnosis not present

## 2020-11-16 DIAGNOSIS — I10 Essential (primary) hypertension: Secondary | ICD-10-CM | POA: Diagnosis not present

## 2020-11-16 DIAGNOSIS — E1165 Type 2 diabetes mellitus with hyperglycemia: Secondary | ICD-10-CM | POA: Diagnosis not present

## 2020-11-16 DIAGNOSIS — C50811 Malignant neoplasm of overlapping sites of right female breast: Secondary | ICD-10-CM | POA: Diagnosis not present

## 2020-11-16 DIAGNOSIS — I209 Angina pectoris, unspecified: Secondary | ICD-10-CM | POA: Diagnosis not present

## 2020-11-16 DIAGNOSIS — Z170 Estrogen receptor positive status [ER+]: Secondary | ICD-10-CM | POA: Diagnosis not present

## 2020-11-17 DIAGNOSIS — C50811 Malignant neoplasm of overlapping sites of right female breast: Secondary | ICD-10-CM | POA: Diagnosis not present

## 2020-11-17 DIAGNOSIS — R319 Hematuria, unspecified: Secondary | ICD-10-CM | POA: Diagnosis not present

## 2020-11-17 DIAGNOSIS — Z170 Estrogen receptor positive status [ER+]: Secondary | ICD-10-CM | POA: Diagnosis not present

## 2020-11-17 DIAGNOSIS — N390 Urinary tract infection, site not specified: Secondary | ICD-10-CM | POA: Diagnosis not present

## 2020-11-18 DIAGNOSIS — Z7689 Persons encountering health services in other specified circumstances: Secondary | ICD-10-CM | POA: Diagnosis not present

## 2020-11-18 DIAGNOSIS — Z170 Estrogen receptor positive status [ER+]: Secondary | ICD-10-CM | POA: Diagnosis not present

## 2020-11-18 DIAGNOSIS — C50811 Malignant neoplasm of overlapping sites of right female breast: Secondary | ICD-10-CM | POA: Diagnosis not present

## 2020-11-19 ENCOUNTER — Encounter (HOSPITAL_COMMUNITY): Payer: Self-pay | Admitting: Surgery

## 2020-11-19 DIAGNOSIS — I10 Essential (primary) hypertension: Secondary | ICD-10-CM | POA: Diagnosis not present

## 2020-11-19 DIAGNOSIS — E1165 Type 2 diabetes mellitus with hyperglycemia: Secondary | ICD-10-CM | POA: Diagnosis not present

## 2020-11-19 DIAGNOSIS — Z299 Encounter for prophylactic measures, unspecified: Secondary | ICD-10-CM | POA: Diagnosis not present

## 2020-11-19 DIAGNOSIS — E113293 Type 2 diabetes mellitus with mild nonproliferative diabetic retinopathy without macular edema, bilateral: Secondary | ICD-10-CM | POA: Diagnosis not present

## 2020-11-27 DIAGNOSIS — D701 Agranulocytosis secondary to cancer chemotherapy: Secondary | ICD-10-CM | POA: Diagnosis not present

## 2020-11-27 DIAGNOSIS — C50811 Malignant neoplasm of overlapping sites of right female breast: Secondary | ICD-10-CM | POA: Diagnosis not present

## 2020-11-27 DIAGNOSIS — G893 Neoplasm related pain (acute) (chronic): Secondary | ICD-10-CM | POA: Diagnosis not present

## 2020-11-27 DIAGNOSIS — T451X5A Adverse effect of antineoplastic and immunosuppressive drugs, initial encounter: Secondary | ICD-10-CM | POA: Diagnosis not present

## 2020-11-27 DIAGNOSIS — I10 Essential (primary) hypertension: Secondary | ICD-10-CM | POA: Diagnosis not present

## 2020-11-27 DIAGNOSIS — D126 Benign neoplasm of colon, unspecified: Secondary | ICD-10-CM | POA: Diagnosis not present

## 2020-11-27 DIAGNOSIS — E119 Type 2 diabetes mellitus without complications: Secondary | ICD-10-CM | POA: Diagnosis not present

## 2020-11-27 DIAGNOSIS — Z7189 Other specified counseling: Secondary | ICD-10-CM | POA: Diagnosis not present

## 2020-11-27 DIAGNOSIS — Z170 Estrogen receptor positive status [ER+]: Secondary | ICD-10-CM | POA: Diagnosis not present

## 2020-11-27 DIAGNOSIS — Z09 Encounter for follow-up examination after completed treatment for conditions other than malignant neoplasm: Secondary | ICD-10-CM | POA: Diagnosis not present

## 2020-11-30 DIAGNOSIS — E1165 Type 2 diabetes mellitus with hyperglycemia: Secondary | ICD-10-CM | POA: Diagnosis not present

## 2020-11-30 DIAGNOSIS — Z299 Encounter for prophylactic measures, unspecified: Secondary | ICD-10-CM | POA: Diagnosis not present

## 2020-11-30 DIAGNOSIS — C50911 Malignant neoplasm of unspecified site of right female breast: Secondary | ICD-10-CM | POA: Diagnosis not present

## 2020-11-30 DIAGNOSIS — I10 Essential (primary) hypertension: Secondary | ICD-10-CM | POA: Diagnosis not present

## 2020-12-04 DIAGNOSIS — E119 Type 2 diabetes mellitus without complications: Secondary | ICD-10-CM | POA: Diagnosis not present

## 2020-12-04 DIAGNOSIS — Z87891 Personal history of nicotine dependence: Secondary | ICD-10-CM | POA: Diagnosis not present

## 2020-12-04 DIAGNOSIS — N6452 Nipple discharge: Secondary | ICD-10-CM | POA: Diagnosis not present

## 2020-12-04 DIAGNOSIS — Z170 Estrogen receptor positive status [ER+]: Secondary | ICD-10-CM | POA: Diagnosis not present

## 2020-12-04 DIAGNOSIS — T50905D Adverse effect of unspecified drugs, medicaments and biological substances, subsequent encounter: Secondary | ICD-10-CM | POA: Diagnosis not present

## 2020-12-04 DIAGNOSIS — Z7984 Long term (current) use of oral hypoglycemic drugs: Secondary | ICD-10-CM | POA: Diagnosis not present

## 2020-12-04 DIAGNOSIS — D701 Agranulocytosis secondary to cancer chemotherapy: Secondary | ICD-10-CM | POA: Diagnosis not present

## 2020-12-04 DIAGNOSIS — T451X5A Adverse effect of antineoplastic and immunosuppressive drugs, initial encounter: Secondary | ICD-10-CM | POA: Diagnosis not present

## 2020-12-04 DIAGNOSIS — Z7189 Other specified counseling: Secondary | ICD-10-CM | POA: Diagnosis not present

## 2020-12-04 DIAGNOSIS — Z803 Family history of malignant neoplasm of breast: Secondary | ICD-10-CM | POA: Diagnosis not present

## 2020-12-04 DIAGNOSIS — Z09 Encounter for follow-up examination after completed treatment for conditions other than malignant neoplasm: Secondary | ICD-10-CM | POA: Diagnosis not present

## 2020-12-04 DIAGNOSIS — C50811 Malignant neoplasm of overlapping sites of right female breast: Secondary | ICD-10-CM | POA: Diagnosis not present

## 2020-12-04 DIAGNOSIS — I10 Essential (primary) hypertension: Secondary | ICD-10-CM | POA: Diagnosis not present

## 2020-12-04 DIAGNOSIS — E785 Hyperlipidemia, unspecified: Secondary | ICD-10-CM | POA: Diagnosis not present

## 2020-12-04 DIAGNOSIS — Z780 Asymptomatic menopausal state: Secondary | ICD-10-CM | POA: Diagnosis not present

## 2020-12-08 DIAGNOSIS — Z170 Estrogen receptor positive status [ER+]: Secondary | ICD-10-CM | POA: Diagnosis not present

## 2020-12-08 DIAGNOSIS — Z5111 Encounter for antineoplastic chemotherapy: Secondary | ICD-10-CM | POA: Diagnosis not present

## 2020-12-08 DIAGNOSIS — C50811 Malignant neoplasm of overlapping sites of right female breast: Secondary | ICD-10-CM | POA: Diagnosis not present

## 2020-12-09 DIAGNOSIS — Z170 Estrogen receptor positive status [ER+]: Secondary | ICD-10-CM | POA: Diagnosis not present

## 2020-12-09 DIAGNOSIS — C50811 Malignant neoplasm of overlapping sites of right female breast: Secondary | ICD-10-CM | POA: Diagnosis not present

## 2020-12-09 DIAGNOSIS — Z7689 Persons encountering health services in other specified circumstances: Secondary | ICD-10-CM | POA: Diagnosis not present

## 2020-12-09 DIAGNOSIS — Z09 Encounter for follow-up examination after completed treatment for conditions other than malignant neoplasm: Secondary | ICD-10-CM | POA: Insufficient documentation

## 2020-12-09 DIAGNOSIS — T451X5A Adverse effect of antineoplastic and immunosuppressive drugs, initial encounter: Secondary | ICD-10-CM | POA: Insufficient documentation

## 2020-12-09 DIAGNOSIS — G893 Neoplasm related pain (acute) (chronic): Secondary | ICD-10-CM | POA: Insufficient documentation

## 2020-12-12 DIAGNOSIS — E785 Hyperlipidemia, unspecified: Secondary | ICD-10-CM | POA: Diagnosis not present

## 2020-12-12 DIAGNOSIS — R0602 Shortness of breath: Secondary | ICD-10-CM | POA: Diagnosis not present

## 2020-12-12 DIAGNOSIS — E119 Type 2 diabetes mellitus without complications: Secondary | ICD-10-CM | POA: Diagnosis not present

## 2020-12-12 DIAGNOSIS — J441 Chronic obstructive pulmonary disease with (acute) exacerbation: Secondary | ICD-10-CM | POA: Diagnosis not present

## 2020-12-12 DIAGNOSIS — C50911 Malignant neoplasm of unspecified site of right female breast: Secondary | ICD-10-CM | POA: Diagnosis not present

## 2020-12-12 DIAGNOSIS — R000 Tachycardia, unspecified: Secondary | ICD-10-CM | POA: Diagnosis not present

## 2020-12-12 DIAGNOSIS — I10 Essential (primary) hypertension: Secondary | ICD-10-CM | POA: Diagnosis not present

## 2020-12-12 DIAGNOSIS — Z9049 Acquired absence of other specified parts of digestive tract: Secondary | ICD-10-CM | POA: Diagnosis not present

## 2020-12-12 DIAGNOSIS — Z803 Family history of malignant neoplasm of breast: Secondary | ICD-10-CM | POA: Diagnosis not present

## 2020-12-12 DIAGNOSIS — C50811 Malignant neoplasm of overlapping sites of right female breast: Secondary | ICD-10-CM | POA: Diagnosis not present

## 2020-12-12 DIAGNOSIS — Z20822 Contact with and (suspected) exposure to covid-19: Secondary | ICD-10-CM | POA: Diagnosis not present

## 2020-12-12 DIAGNOSIS — R059 Cough, unspecified: Secondary | ICD-10-CM | POA: Diagnosis not present

## 2020-12-12 DIAGNOSIS — Z87891 Personal history of nicotine dependence: Secondary | ICD-10-CM | POA: Diagnosis not present

## 2020-12-14 DIAGNOSIS — E1165 Type 2 diabetes mellitus with hyperglycemia: Secondary | ICD-10-CM | POA: Diagnosis not present

## 2020-12-14 DIAGNOSIS — T50905A Adverse effect of unspecified drugs, medicaments and biological substances, initial encounter: Secondary | ICD-10-CM | POA: Insufficient documentation

## 2020-12-15 DIAGNOSIS — B370 Candidal stomatitis: Secondary | ICD-10-CM | POA: Diagnosis not present

## 2020-12-15 DIAGNOSIS — E785 Hyperlipidemia, unspecified: Secondary | ICD-10-CM | POA: Diagnosis not present

## 2020-12-15 DIAGNOSIS — I10 Essential (primary) hypertension: Secondary | ICD-10-CM | POA: Diagnosis not present

## 2020-12-15 DIAGNOSIS — R059 Cough, unspecified: Secondary | ICD-10-CM | POA: Diagnosis not present

## 2020-12-15 DIAGNOSIS — J029 Acute pharyngitis, unspecified: Secondary | ICD-10-CM | POA: Diagnosis not present

## 2020-12-15 DIAGNOSIS — C50811 Malignant neoplasm of overlapping sites of right female breast: Secondary | ICD-10-CM | POA: Diagnosis not present

## 2020-12-15 DIAGNOSIS — E119 Type 2 diabetes mellitus without complications: Secondary | ICD-10-CM | POA: Diagnosis not present

## 2020-12-15 DIAGNOSIS — B3781 Candidal esophagitis: Secondary | ICD-10-CM | POA: Diagnosis not present

## 2020-12-15 DIAGNOSIS — Z87891 Personal history of nicotine dependence: Secondary | ICD-10-CM | POA: Diagnosis not present

## 2020-12-15 DIAGNOSIS — Z170 Estrogen receptor positive status [ER+]: Secondary | ICD-10-CM | POA: Diagnosis not present

## 2020-12-15 DIAGNOSIS — Z20822 Contact with and (suspected) exposure to covid-19: Secondary | ICD-10-CM | POA: Diagnosis not present

## 2020-12-15 DIAGNOSIS — F32A Depression, unspecified: Secondary | ICD-10-CM | POA: Diagnosis not present

## 2020-12-16 DIAGNOSIS — J440 Chronic obstructive pulmonary disease with acute lower respiratory infection: Secondary | ICD-10-CM | POA: Diagnosis not present

## 2020-12-16 DIAGNOSIS — E1140 Type 2 diabetes mellitus with diabetic neuropathy, unspecified: Secondary | ICD-10-CM | POA: Diagnosis not present

## 2020-12-16 DIAGNOSIS — Z299 Encounter for prophylactic measures, unspecified: Secondary | ICD-10-CM | POA: Diagnosis not present

## 2020-12-16 DIAGNOSIS — J209 Acute bronchitis, unspecified: Secondary | ICD-10-CM | POA: Diagnosis not present

## 2020-12-16 DIAGNOSIS — I10 Essential (primary) hypertension: Secondary | ICD-10-CM | POA: Diagnosis not present

## 2020-12-25 DIAGNOSIS — Z87891 Personal history of nicotine dependence: Secondary | ICD-10-CM | POA: Diagnosis not present

## 2020-12-25 DIAGNOSIS — Z7984 Long term (current) use of oral hypoglycemic drugs: Secondary | ICD-10-CM | POA: Diagnosis not present

## 2020-12-25 DIAGNOSIS — Z803 Family history of malignant neoplasm of breast: Secondary | ICD-10-CM | POA: Diagnosis not present

## 2020-12-25 DIAGNOSIS — R600 Localized edema: Secondary | ICD-10-CM | POA: Diagnosis not present

## 2020-12-25 DIAGNOSIS — Z780 Asymptomatic menopausal state: Secondary | ICD-10-CM | POA: Diagnosis not present

## 2020-12-25 DIAGNOSIS — T451X5A Adverse effect of antineoplastic and immunosuppressive drugs, initial encounter: Secondary | ICD-10-CM | POA: Diagnosis not present

## 2020-12-25 DIAGNOSIS — Z170 Estrogen receptor positive status [ER+]: Secondary | ICD-10-CM | POA: Diagnosis not present

## 2020-12-25 DIAGNOSIS — K1231 Oral mucositis (ulcerative) due to antineoplastic therapy: Secondary | ICD-10-CM | POA: Diagnosis not present

## 2020-12-25 DIAGNOSIS — Z7189 Other specified counseling: Secondary | ICD-10-CM | POA: Diagnosis not present

## 2020-12-25 DIAGNOSIS — I10 Essential (primary) hypertension: Secondary | ICD-10-CM | POA: Diagnosis not present

## 2020-12-25 DIAGNOSIS — G893 Neoplasm related pain (acute) (chronic): Secondary | ICD-10-CM | POA: Diagnosis not present

## 2020-12-25 DIAGNOSIS — C50811 Malignant neoplasm of overlapping sites of right female breast: Secondary | ICD-10-CM | POA: Diagnosis not present

## 2020-12-25 DIAGNOSIS — E119 Type 2 diabetes mellitus without complications: Secondary | ICD-10-CM | POA: Diagnosis not present

## 2020-12-25 DIAGNOSIS — E785 Hyperlipidemia, unspecified: Secondary | ICD-10-CM | POA: Diagnosis not present

## 2020-12-25 DIAGNOSIS — Z09 Encounter for follow-up examination after completed treatment for conditions other than malignant neoplasm: Secondary | ICD-10-CM | POA: Diagnosis not present

## 2020-12-25 DIAGNOSIS — D701 Agranulocytosis secondary to cancer chemotherapy: Secondary | ICD-10-CM | POA: Diagnosis not present

## 2020-12-29 DIAGNOSIS — I82611 Acute embolism and thrombosis of superficial veins of right upper extremity: Secondary | ICD-10-CM | POA: Diagnosis not present

## 2020-12-29 DIAGNOSIS — I82A11 Acute embolism and thrombosis of right axillary vein: Secondary | ICD-10-CM | POA: Diagnosis not present

## 2020-12-29 DIAGNOSIS — I82C11 Acute embolism and thrombosis of right internal jugular vein: Secondary | ICD-10-CM | POA: Diagnosis not present

## 2020-12-29 DIAGNOSIS — Z7189 Other specified counseling: Secondary | ICD-10-CM | POA: Diagnosis not present

## 2020-12-29 DIAGNOSIS — Z170 Estrogen receptor positive status [ER+]: Secondary | ICD-10-CM | POA: Diagnosis not present

## 2020-12-29 DIAGNOSIS — Z09 Encounter for follow-up examination after completed treatment for conditions other than malignant neoplasm: Secondary | ICD-10-CM | POA: Diagnosis not present

## 2020-12-29 DIAGNOSIS — T85868A Thrombosis due to other internal prosthetic devices, implants and grafts, initial encounter: Secondary | ICD-10-CM | POA: Diagnosis not present

## 2020-12-29 DIAGNOSIS — R600 Localized edema: Secondary | ICD-10-CM | POA: Diagnosis not present

## 2020-12-29 DIAGNOSIS — C50811 Malignant neoplasm of overlapping sites of right female breast: Secondary | ICD-10-CM | POA: Diagnosis not present

## 2020-12-29 DIAGNOSIS — I82621 Acute embolism and thrombosis of deep veins of right upper extremity: Secondary | ICD-10-CM | POA: Diagnosis not present

## 2020-12-31 DIAGNOSIS — I82621 Acute embolism and thrombosis of deep veins of right upper extremity: Secondary | ICD-10-CM | POA: Diagnosis not present

## 2020-12-31 DIAGNOSIS — I82A11 Acute embolism and thrombosis of right axillary vein: Secondary | ICD-10-CM | POA: Diagnosis not present

## 2020-12-31 DIAGNOSIS — E119 Type 2 diabetes mellitus without complications: Secondary | ICD-10-CM | POA: Diagnosis not present

## 2020-12-31 DIAGNOSIS — C50811 Malignant neoplasm of overlapping sites of right female breast: Secondary | ICD-10-CM | POA: Diagnosis not present

## 2020-12-31 DIAGNOSIS — R079 Chest pain, unspecified: Secondary | ICD-10-CM | POA: Diagnosis not present

## 2020-12-31 DIAGNOSIS — M79602 Pain in left arm: Secondary | ICD-10-CM | POA: Diagnosis not present

## 2020-12-31 DIAGNOSIS — Z803 Family history of malignant neoplasm of breast: Secondary | ICD-10-CM | POA: Diagnosis not present

## 2020-12-31 DIAGNOSIS — I10 Essential (primary) hypertension: Secondary | ICD-10-CM | POA: Diagnosis not present

## 2020-12-31 DIAGNOSIS — I493 Ventricular premature depolarization: Secondary | ICD-10-CM | POA: Diagnosis not present

## 2020-12-31 DIAGNOSIS — I82C11 Acute embolism and thrombosis of right internal jugular vein: Secondary | ICD-10-CM | POA: Diagnosis not present

## 2020-12-31 DIAGNOSIS — Z87891 Personal history of nicotine dependence: Secondary | ICD-10-CM | POA: Diagnosis not present

## 2021-01-01 DIAGNOSIS — Z789 Other specified health status: Secondary | ICD-10-CM | POA: Diagnosis not present

## 2021-01-01 DIAGNOSIS — I82621 Acute embolism and thrombosis of deep veins of right upper extremity: Secondary | ICD-10-CM | POA: Diagnosis not present

## 2021-01-01 DIAGNOSIS — Z7189 Other specified counseling: Secondary | ICD-10-CM | POA: Diagnosis not present

## 2021-01-01 DIAGNOSIS — Z7901 Long term (current) use of anticoagulants: Secondary | ICD-10-CM | POA: Diagnosis not present

## 2021-01-01 DIAGNOSIS — R600 Localized edema: Secondary | ICD-10-CM | POA: Diagnosis not present

## 2021-01-01 DIAGNOSIS — Z170 Estrogen receptor positive status [ER+]: Secondary | ICD-10-CM | POA: Diagnosis not present

## 2021-01-01 DIAGNOSIS — C50811 Malignant neoplasm of overlapping sites of right female breast: Secondary | ICD-10-CM | POA: Diagnosis not present

## 2021-01-01 DIAGNOSIS — Z538 Procedure and treatment not carried out for other reasons: Secondary | ICD-10-CM | POA: Diagnosis not present

## 2021-01-05 DIAGNOSIS — J209 Acute bronchitis, unspecified: Secondary | ICD-10-CM | POA: Diagnosis not present

## 2021-01-05 DIAGNOSIS — I82409 Acute embolism and thrombosis of unspecified deep veins of unspecified lower extremity: Secondary | ICD-10-CM | POA: Diagnosis not present

## 2021-01-05 DIAGNOSIS — I10 Essential (primary) hypertension: Secondary | ICD-10-CM | POA: Diagnosis not present

## 2021-01-05 DIAGNOSIS — Z299 Encounter for prophylactic measures, unspecified: Secondary | ICD-10-CM | POA: Diagnosis not present

## 2021-01-05 DIAGNOSIS — J440 Chronic obstructive pulmonary disease with acute lower respiratory infection: Secondary | ICD-10-CM | POA: Diagnosis not present

## 2021-01-05 DIAGNOSIS — E1165 Type 2 diabetes mellitus with hyperglycemia: Secondary | ICD-10-CM | POA: Diagnosis not present

## 2021-01-06 DIAGNOSIS — R600 Localized edema: Secondary | ICD-10-CM | POA: Insufficient documentation

## 2021-01-08 DIAGNOSIS — Z452 Encounter for adjustment and management of vascular access device: Secondary | ICD-10-CM | POA: Diagnosis not present

## 2021-01-08 DIAGNOSIS — Z170 Estrogen receptor positive status [ER+]: Secondary | ICD-10-CM | POA: Diagnosis not present

## 2021-01-08 DIAGNOSIS — C50811 Malignant neoplasm of overlapping sites of right female breast: Secondary | ICD-10-CM | POA: Diagnosis not present

## 2021-01-08 DIAGNOSIS — C50919 Malignant neoplasm of unspecified site of unspecified female breast: Secondary | ICD-10-CM | POA: Diagnosis not present

## 2021-01-08 DIAGNOSIS — I82621 Acute embolism and thrombosis of deep veins of right upper extremity: Secondary | ICD-10-CM | POA: Diagnosis not present

## 2021-01-09 DIAGNOSIS — Z7189 Other specified counseling: Secondary | ICD-10-CM | POA: Diagnosis not present

## 2021-01-09 DIAGNOSIS — Z789 Other specified health status: Secondary | ICD-10-CM | POA: Diagnosis not present

## 2021-01-10 DIAGNOSIS — Z7189 Other specified counseling: Secondary | ICD-10-CM | POA: Diagnosis not present

## 2021-01-10 DIAGNOSIS — Z4589 Encounter for adjustment and management of other implanted devices: Secondary | ICD-10-CM | POA: Diagnosis not present

## 2021-01-12 DIAGNOSIS — Z803 Family history of malignant neoplasm of breast: Secondary | ICD-10-CM | POA: Diagnosis not present

## 2021-01-12 DIAGNOSIS — C50811 Malignant neoplasm of overlapping sites of right female breast: Secondary | ICD-10-CM | POA: Diagnosis not present

## 2021-01-12 DIAGNOSIS — Z7901 Long term (current) use of anticoagulants: Secondary | ICD-10-CM | POA: Insufficient documentation

## 2021-01-12 DIAGNOSIS — Z09 Encounter for follow-up examination after completed treatment for conditions other than malignant neoplasm: Secondary | ICD-10-CM | POA: Diagnosis not present

## 2021-01-12 DIAGNOSIS — K219 Gastro-esophageal reflux disease without esophagitis: Secondary | ICD-10-CM | POA: Diagnosis not present

## 2021-01-12 DIAGNOSIS — Z7984 Long term (current) use of oral hypoglycemic drugs: Secondary | ICD-10-CM | POA: Diagnosis not present

## 2021-01-12 DIAGNOSIS — E785 Hyperlipidemia, unspecified: Secondary | ICD-10-CM | POA: Diagnosis not present

## 2021-01-12 DIAGNOSIS — Z538 Procedure and treatment not carried out for other reasons: Secondary | ICD-10-CM | POA: Diagnosis not present

## 2021-01-12 DIAGNOSIS — I82621 Acute embolism and thrombosis of deep veins of right upper extremity: Secondary | ICD-10-CM | POA: Insufficient documentation

## 2021-01-12 DIAGNOSIS — R600 Localized edema: Secondary | ICD-10-CM | POA: Insufficient documentation

## 2021-01-12 DIAGNOSIS — Z452 Encounter for adjustment and management of vascular access device: Secondary | ICD-10-CM | POA: Insufficient documentation

## 2021-01-12 DIAGNOSIS — Z170 Estrogen receptor positive status [ER+]: Secondary | ICD-10-CM | POA: Diagnosis not present

## 2021-01-12 DIAGNOSIS — Z79899 Other long term (current) drug therapy: Secondary | ICD-10-CM | POA: Diagnosis not present

## 2021-01-12 DIAGNOSIS — Z87891 Personal history of nicotine dependence: Secondary | ICD-10-CM | POA: Diagnosis not present

## 2021-01-12 DIAGNOSIS — Z794 Long term (current) use of insulin: Secondary | ICD-10-CM | POA: Diagnosis not present

## 2021-01-12 DIAGNOSIS — Z7189 Other specified counseling: Secondary | ICD-10-CM | POA: Diagnosis not present

## 2021-01-12 DIAGNOSIS — Z780 Asymptomatic menopausal state: Secondary | ICD-10-CM | POA: Diagnosis not present

## 2021-01-12 DIAGNOSIS — Z789 Other specified health status: Secondary | ICD-10-CM | POA: Insufficient documentation

## 2021-01-12 DIAGNOSIS — I10 Essential (primary) hypertension: Secondary | ICD-10-CM | POA: Diagnosis not present

## 2021-01-12 DIAGNOSIS — E119 Type 2 diabetes mellitus without complications: Secondary | ICD-10-CM | POA: Diagnosis not present

## 2021-01-13 DIAGNOSIS — I209 Angina pectoris, unspecified: Secondary | ICD-10-CM | POA: Diagnosis not present

## 2021-01-13 DIAGNOSIS — E039 Hypothyroidism, unspecified: Secondary | ICD-10-CM | POA: Diagnosis not present

## 2021-01-13 DIAGNOSIS — C50811 Malignant neoplasm of overlapping sites of right female breast: Secondary | ICD-10-CM | POA: Diagnosis not present

## 2021-01-13 DIAGNOSIS — Z170 Estrogen receptor positive status [ER+]: Secondary | ICD-10-CM | POA: Diagnosis not present

## 2021-01-14 DIAGNOSIS — Z170 Estrogen receptor positive status [ER+]: Secondary | ICD-10-CM | POA: Diagnosis not present

## 2021-01-14 DIAGNOSIS — C50811 Malignant neoplasm of overlapping sites of right female breast: Secondary | ICD-10-CM | POA: Diagnosis not present

## 2021-01-14 DIAGNOSIS — R6889 Other general symptoms and signs: Secondary | ICD-10-CM | POA: Diagnosis not present

## 2021-01-14 DIAGNOSIS — E1165 Type 2 diabetes mellitus with hyperglycemia: Secondary | ICD-10-CM | POA: Diagnosis not present

## 2021-01-14 DIAGNOSIS — R601 Generalized edema: Secondary | ICD-10-CM | POA: Diagnosis not present

## 2021-01-14 DIAGNOSIS — R059 Cough, unspecified: Secondary | ICD-10-CM | POA: Diagnosis not present

## 2021-01-14 DIAGNOSIS — Z452 Encounter for adjustment and management of vascular access device: Secondary | ICD-10-CM | POA: Diagnosis not present

## 2021-01-14 DIAGNOSIS — I82621 Acute embolism and thrombosis of deep veins of right upper extremity: Secondary | ICD-10-CM | POA: Diagnosis not present

## 2021-01-14 DIAGNOSIS — Z5111 Encounter for antineoplastic chemotherapy: Secondary | ICD-10-CM | POA: Diagnosis not present

## 2021-01-15 DIAGNOSIS — Z170 Estrogen receptor positive status [ER+]: Secondary | ICD-10-CM | POA: Diagnosis not present

## 2021-01-15 DIAGNOSIS — E1165 Type 2 diabetes mellitus with hyperglycemia: Secondary | ICD-10-CM | POA: Diagnosis not present

## 2021-01-15 DIAGNOSIS — N390 Urinary tract infection, site not specified: Secondary | ICD-10-CM | POA: Diagnosis not present

## 2021-01-15 DIAGNOSIS — I10 Essential (primary) hypertension: Secondary | ICD-10-CM | POA: Diagnosis not present

## 2021-01-15 DIAGNOSIS — R300 Dysuria: Secondary | ICD-10-CM | POA: Diagnosis not present

## 2021-01-15 DIAGNOSIS — C50811 Malignant neoplasm of overlapping sites of right female breast: Secondary | ICD-10-CM | POA: Diagnosis not present

## 2021-01-15 DIAGNOSIS — Z299 Encounter for prophylactic measures, unspecified: Secondary | ICD-10-CM | POA: Diagnosis not present

## 2021-01-16 DIAGNOSIS — Z452 Encounter for adjustment and management of vascular access device: Secondary | ICD-10-CM | POA: Diagnosis not present

## 2021-01-16 DIAGNOSIS — C50811 Malignant neoplasm of overlapping sites of right female breast: Secondary | ICD-10-CM | POA: Diagnosis not present

## 2021-01-16 DIAGNOSIS — Z170 Estrogen receptor positive status [ER+]: Secondary | ICD-10-CM | POA: Diagnosis not present

## 2021-01-17 DIAGNOSIS — Z452 Encounter for adjustment and management of vascular access device: Secondary | ICD-10-CM | POA: Diagnosis not present

## 2021-01-17 DIAGNOSIS — Z170 Estrogen receptor positive status [ER+]: Secondary | ICD-10-CM | POA: Diagnosis not present

## 2021-01-17 DIAGNOSIS — C50811 Malignant neoplasm of overlapping sites of right female breast: Secondary | ICD-10-CM | POA: Diagnosis not present

## 2021-01-19 DIAGNOSIS — C50811 Malignant neoplasm of overlapping sites of right female breast: Secondary | ICD-10-CM | POA: Diagnosis not present

## 2021-01-19 DIAGNOSIS — Z170 Estrogen receptor positive status [ER+]: Secondary | ICD-10-CM | POA: Diagnosis not present

## 2021-01-19 DIAGNOSIS — Z79899 Other long term (current) drug therapy: Secondary | ICD-10-CM | POA: Diagnosis not present

## 2021-01-20 DIAGNOSIS — Z452 Encounter for adjustment and management of vascular access device: Secondary | ICD-10-CM | POA: Diagnosis not present

## 2021-01-21 DIAGNOSIS — Z170 Estrogen receptor positive status [ER+]: Secondary | ICD-10-CM | POA: Diagnosis not present

## 2021-01-21 DIAGNOSIS — C50811 Malignant neoplasm of overlapping sites of right female breast: Secondary | ICD-10-CM | POA: Diagnosis not present

## 2021-01-26 DIAGNOSIS — I10 Essential (primary) hypertension: Secondary | ICD-10-CM | POA: Diagnosis not present

## 2021-01-26 DIAGNOSIS — Z7984 Long term (current) use of oral hypoglycemic drugs: Secondary | ICD-10-CM | POA: Diagnosis not present

## 2021-01-26 DIAGNOSIS — Z515 Encounter for palliative care: Secondary | ICD-10-CM | POA: Diagnosis not present

## 2021-01-26 DIAGNOSIS — Z5329 Procedure and treatment not carried out because of patient's decision for other reasons: Secondary | ICD-10-CM | POA: Diagnosis not present

## 2021-01-26 DIAGNOSIS — E785 Hyperlipidemia, unspecified: Secondary | ICD-10-CM | POA: Diagnosis not present

## 2021-01-26 DIAGNOSIS — E119 Type 2 diabetes mellitus without complications: Secondary | ICD-10-CM | POA: Diagnosis not present

## 2021-01-26 DIAGNOSIS — Z7901 Long term (current) use of anticoagulants: Secondary | ICD-10-CM | POA: Diagnosis not present

## 2021-01-26 DIAGNOSIS — E041 Nontoxic single thyroid nodule: Secondary | ICD-10-CM | POA: Diagnosis not present

## 2021-01-26 DIAGNOSIS — M2550 Pain in unspecified joint: Secondary | ICD-10-CM | POA: Diagnosis not present

## 2021-01-26 DIAGNOSIS — M791 Myalgia, unspecified site: Secondary | ICD-10-CM | POA: Diagnosis not present

## 2021-01-26 DIAGNOSIS — Z87442 Personal history of urinary calculi: Secondary | ICD-10-CM | POA: Diagnosis not present

## 2021-01-26 DIAGNOSIS — N6452 Nipple discharge: Secondary | ICD-10-CM | POA: Diagnosis not present

## 2021-01-26 DIAGNOSIS — Z803 Family history of malignant neoplasm of breast: Secondary | ICD-10-CM | POA: Diagnosis not present

## 2021-01-26 DIAGNOSIS — I82621 Acute embolism and thrombosis of deep veins of right upper extremity: Secondary | ICD-10-CM | POA: Diagnosis not present

## 2021-01-26 DIAGNOSIS — Z79899 Other long term (current) drug therapy: Secondary | ICD-10-CM | POA: Diagnosis not present

## 2021-01-26 DIAGNOSIS — Z87891 Personal history of nicotine dependence: Secondary | ICD-10-CM | POA: Diagnosis not present

## 2021-01-26 DIAGNOSIS — Z881 Allergy status to other antibiotic agents status: Secondary | ICD-10-CM | POA: Diagnosis not present

## 2021-01-26 DIAGNOSIS — Z170 Estrogen receptor positive status [ER+]: Secondary | ICD-10-CM | POA: Diagnosis not present

## 2021-01-26 DIAGNOSIS — Z9049 Acquired absence of other specified parts of digestive tract: Secondary | ICD-10-CM | POA: Diagnosis not present

## 2021-01-26 DIAGNOSIS — K219 Gastro-esophageal reflux disease without esophagitis: Secondary | ICD-10-CM | POA: Diagnosis not present

## 2021-01-26 DIAGNOSIS — T82868D Thrombosis of vascular prosthetic devices, implants and grafts, subsequent encounter: Secondary | ICD-10-CM | POA: Diagnosis not present

## 2021-01-26 DIAGNOSIS — C50811 Malignant neoplasm of overlapping sites of right female breast: Secondary | ICD-10-CM | POA: Diagnosis not present

## 2021-01-27 DIAGNOSIS — C50811 Malignant neoplasm of overlapping sites of right female breast: Secondary | ICD-10-CM | POA: Diagnosis not present

## 2021-01-27 DIAGNOSIS — Z170 Estrogen receptor positive status [ER+]: Secondary | ICD-10-CM | POA: Diagnosis not present

## 2021-01-28 DIAGNOSIS — Z452 Encounter for adjustment and management of vascular access device: Secondary | ICD-10-CM | POA: Diagnosis not present

## 2021-01-28 DIAGNOSIS — Z170 Estrogen receptor positive status [ER+]: Secondary | ICD-10-CM | POA: Diagnosis not present

## 2021-01-28 DIAGNOSIS — C50811 Malignant neoplasm of overlapping sites of right female breast: Secondary | ICD-10-CM | POA: Diagnosis not present

## 2021-01-29 DIAGNOSIS — Z452 Encounter for adjustment and management of vascular access device: Secondary | ICD-10-CM | POA: Diagnosis not present

## 2021-01-29 DIAGNOSIS — Z170 Estrogen receptor positive status [ER+]: Secondary | ICD-10-CM | POA: Diagnosis not present

## 2021-01-29 DIAGNOSIS — C50811 Malignant neoplasm of overlapping sites of right female breast: Secondary | ICD-10-CM | POA: Diagnosis not present

## 2021-01-31 DIAGNOSIS — Z7901 Long term (current) use of anticoagulants: Secondary | ICD-10-CM | POA: Insufficient documentation

## 2021-01-31 DIAGNOSIS — Z91199 Patient's noncompliance with other medical treatment and regimen due to unspecified reason: Secondary | ICD-10-CM | POA: Insufficient documentation

## 2021-02-01 DIAGNOSIS — C50811 Malignant neoplasm of overlapping sites of right female breast: Secondary | ICD-10-CM | POA: Diagnosis not present

## 2021-02-01 DIAGNOSIS — Z170 Estrogen receptor positive status [ER+]: Secondary | ICD-10-CM | POA: Diagnosis not present

## 2021-02-02 DIAGNOSIS — C50811 Malignant neoplasm of overlapping sites of right female breast: Secondary | ICD-10-CM | POA: Diagnosis not present

## 2021-02-02 DIAGNOSIS — Z87891 Personal history of nicotine dependence: Secondary | ICD-10-CM | POA: Diagnosis not present

## 2021-02-02 DIAGNOSIS — E785 Hyperlipidemia, unspecified: Secondary | ICD-10-CM | POA: Diagnosis not present

## 2021-02-02 DIAGNOSIS — I82621 Acute embolism and thrombosis of deep veins of right upper extremity: Secondary | ICD-10-CM | POA: Diagnosis not present

## 2021-02-02 DIAGNOSIS — Z9049 Acquired absence of other specified parts of digestive tract: Secondary | ICD-10-CM | POA: Diagnosis not present

## 2021-02-02 DIAGNOSIS — K219 Gastro-esophageal reflux disease without esophagitis: Secondary | ICD-10-CM | POA: Diagnosis not present

## 2021-02-02 DIAGNOSIS — E119 Type 2 diabetes mellitus without complications: Secondary | ICD-10-CM | POA: Diagnosis not present

## 2021-02-02 DIAGNOSIS — Z7901 Long term (current) use of anticoagulants: Secondary | ICD-10-CM | POA: Diagnosis not present

## 2021-02-02 DIAGNOSIS — Z7984 Long term (current) use of oral hypoglycemic drugs: Secondary | ICD-10-CM | POA: Diagnosis not present

## 2021-02-02 DIAGNOSIS — Z79899 Other long term (current) drug therapy: Secondary | ICD-10-CM | POA: Diagnosis not present

## 2021-02-02 DIAGNOSIS — Z881 Allergy status to other antibiotic agents status: Secondary | ICD-10-CM | POA: Diagnosis not present

## 2021-02-02 DIAGNOSIS — E041 Nontoxic single thyroid nodule: Secondary | ICD-10-CM | POA: Diagnosis not present

## 2021-02-02 DIAGNOSIS — Z170 Estrogen receptor positive status [ER+]: Secondary | ICD-10-CM | POA: Diagnosis not present

## 2021-02-02 DIAGNOSIS — Z09 Encounter for follow-up examination after completed treatment for conditions other than malignant neoplasm: Secondary | ICD-10-CM | POA: Diagnosis not present

## 2021-02-02 DIAGNOSIS — Z803 Family history of malignant neoplasm of breast: Secondary | ICD-10-CM | POA: Diagnosis not present

## 2021-02-02 DIAGNOSIS — I10 Essential (primary) hypertension: Secondary | ICD-10-CM | POA: Diagnosis not present

## 2021-02-03 DIAGNOSIS — C50811 Malignant neoplasm of overlapping sites of right female breast: Secondary | ICD-10-CM | POA: Diagnosis not present

## 2021-02-03 DIAGNOSIS — Z170 Estrogen receptor positive status [ER+]: Secondary | ICD-10-CM | POA: Diagnosis not present

## 2021-02-04 DIAGNOSIS — Z5111 Encounter for antineoplastic chemotherapy: Secondary | ICD-10-CM | POA: Diagnosis not present

## 2021-02-04 DIAGNOSIS — Z170 Estrogen receptor positive status [ER+]: Secondary | ICD-10-CM | POA: Diagnosis not present

## 2021-02-04 DIAGNOSIS — C50811 Malignant neoplasm of overlapping sites of right female breast: Secondary | ICD-10-CM | POA: Diagnosis not present

## 2021-02-05 DIAGNOSIS — Z170 Estrogen receptor positive status [ER+]: Secondary | ICD-10-CM | POA: Diagnosis not present

## 2021-02-05 DIAGNOSIS — Z7689 Persons encountering health services in other specified circumstances: Secondary | ICD-10-CM | POA: Diagnosis not present

## 2021-02-05 DIAGNOSIS — C50811 Malignant neoplasm of overlapping sites of right female breast: Secondary | ICD-10-CM | POA: Diagnosis not present

## 2021-02-09 DIAGNOSIS — Z515 Encounter for palliative care: Secondary | ICD-10-CM | POA: Diagnosis not present

## 2021-02-09 DIAGNOSIS — C50811 Malignant neoplasm of overlapping sites of right female breast: Secondary | ICD-10-CM | POA: Diagnosis not present

## 2021-02-09 DIAGNOSIS — I82621 Acute embolism and thrombosis of deep veins of right upper extremity: Secondary | ICD-10-CM | POA: Diagnosis not present

## 2021-02-09 DIAGNOSIS — E860 Dehydration: Secondary | ICD-10-CM | POA: Diagnosis not present

## 2021-02-09 DIAGNOSIS — I951 Orthostatic hypotension: Secondary | ICD-10-CM | POA: Diagnosis not present

## 2021-02-09 DIAGNOSIS — R197 Diarrhea, unspecified: Secondary | ICD-10-CM | POA: Diagnosis not present

## 2021-02-09 DIAGNOSIS — Z7901 Long term (current) use of anticoagulants: Secondary | ICD-10-CM | POA: Diagnosis not present

## 2021-02-09 DIAGNOSIS — Z5329 Procedure and treatment not carried out because of patient's decision for other reasons: Secondary | ICD-10-CM | POA: Diagnosis not present

## 2021-02-09 DIAGNOSIS — Z170 Estrogen receptor positive status [ER+]: Secondary | ICD-10-CM | POA: Diagnosis not present

## 2021-02-09 DIAGNOSIS — R5383 Other fatigue: Secondary | ICD-10-CM | POA: Diagnosis not present

## 2021-02-09 DIAGNOSIS — I959 Hypotension, unspecified: Secondary | ICD-10-CM | POA: Diagnosis not present

## 2021-02-12 DIAGNOSIS — E1165 Type 2 diabetes mellitus with hyperglycemia: Secondary | ICD-10-CM | POA: Diagnosis not present

## 2021-02-15 DIAGNOSIS — I10 Essential (primary) hypertension: Secondary | ICD-10-CM | POA: Diagnosis not present

## 2021-02-15 DIAGNOSIS — E1165 Type 2 diabetes mellitus with hyperglycemia: Secondary | ICD-10-CM | POA: Diagnosis not present

## 2021-02-15 DIAGNOSIS — Z299 Encounter for prophylactic measures, unspecified: Secondary | ICD-10-CM | POA: Diagnosis not present

## 2021-02-15 DIAGNOSIS — R000 Tachycardia, unspecified: Secondary | ICD-10-CM | POA: Diagnosis not present

## 2021-02-15 DIAGNOSIS — E113293 Type 2 diabetes mellitus with mild nonproliferative diabetic retinopathy without macular edema, bilateral: Secondary | ICD-10-CM | POA: Diagnosis not present

## 2021-02-18 DIAGNOSIS — Z170 Estrogen receptor positive status [ER+]: Secondary | ICD-10-CM | POA: Diagnosis not present

## 2021-02-18 DIAGNOSIS — Z09 Encounter for follow-up examination after completed treatment for conditions other than malignant neoplasm: Secondary | ICD-10-CM | POA: Diagnosis not present

## 2021-02-18 DIAGNOSIS — C50811 Malignant neoplasm of overlapping sites of right female breast: Secondary | ICD-10-CM | POA: Diagnosis not present

## 2021-02-18 DIAGNOSIS — I82621 Acute embolism and thrombosis of deep veins of right upper extremity: Secondary | ICD-10-CM | POA: Diagnosis not present

## 2021-02-22 DIAGNOSIS — I10 Essential (primary) hypertension: Secondary | ICD-10-CM | POA: Diagnosis not present

## 2021-02-22 DIAGNOSIS — Z87891 Personal history of nicotine dependence: Secondary | ICD-10-CM | POA: Diagnosis not present

## 2021-02-22 DIAGNOSIS — E1165 Type 2 diabetes mellitus with hyperglycemia: Secondary | ICD-10-CM | POA: Diagnosis not present

## 2021-02-22 DIAGNOSIS — Z299 Encounter for prophylactic measures, unspecified: Secondary | ICD-10-CM | POA: Diagnosis not present

## 2021-02-22 DIAGNOSIS — E113293 Type 2 diabetes mellitus with mild nonproliferative diabetic retinopathy without macular edema, bilateral: Secondary | ICD-10-CM | POA: Diagnosis not present

## 2021-02-23 DIAGNOSIS — I82621 Acute embolism and thrombosis of deep veins of right upper extremity: Secondary | ICD-10-CM | POA: Diagnosis not present

## 2021-02-23 DIAGNOSIS — C50811 Malignant neoplasm of overlapping sites of right female breast: Secondary | ICD-10-CM | POA: Diagnosis not present

## 2021-02-23 DIAGNOSIS — Z515 Encounter for palliative care: Secondary | ICD-10-CM | POA: Diagnosis not present

## 2021-02-23 DIAGNOSIS — Z170 Estrogen receptor positive status [ER+]: Secondary | ICD-10-CM | POA: Diagnosis not present

## 2021-02-23 DIAGNOSIS — Z7901 Long term (current) use of anticoagulants: Secondary | ICD-10-CM | POA: Diagnosis not present

## 2021-02-24 DIAGNOSIS — R922 Inconclusive mammogram: Secondary | ICD-10-CM | POA: Diagnosis not present

## 2021-02-24 DIAGNOSIS — N6312 Unspecified lump in the right breast, upper inner quadrant: Secondary | ICD-10-CM | POA: Diagnosis not present

## 2021-02-24 DIAGNOSIS — C50811 Malignant neoplasm of overlapping sites of right female breast: Secondary | ICD-10-CM | POA: Diagnosis not present

## 2021-02-24 DIAGNOSIS — Z170 Estrogen receptor positive status [ER+]: Secondary | ICD-10-CM | POA: Diagnosis not present

## 2021-02-24 DIAGNOSIS — N6311 Unspecified lump in the right breast, upper outer quadrant: Secondary | ICD-10-CM | POA: Diagnosis not present

## 2021-02-25 DIAGNOSIS — C50811 Malignant neoplasm of overlapping sites of right female breast: Secondary | ICD-10-CM | POA: Diagnosis not present

## 2021-02-25 DIAGNOSIS — Z170 Estrogen receptor positive status [ER+]: Secondary | ICD-10-CM | POA: Diagnosis not present

## 2021-02-25 DIAGNOSIS — Z09 Encounter for follow-up examination after completed treatment for conditions other than malignant neoplasm: Secondary | ICD-10-CM | POA: Diagnosis not present

## 2021-02-25 DIAGNOSIS — G893 Neoplasm related pain (acute) (chronic): Secondary | ICD-10-CM | POA: Diagnosis not present

## 2021-02-25 DIAGNOSIS — Z7189 Other specified counseling: Secondary | ICD-10-CM | POA: Diagnosis not present

## 2021-03-08 ENCOUNTER — Other Ambulatory Visit: Payer: Self-pay | Admitting: Surgery

## 2021-03-08 DIAGNOSIS — C50911 Malignant neoplasm of unspecified site of right female breast: Secondary | ICD-10-CM

## 2021-03-12 ENCOUNTER — Ambulatory Visit
Admission: RE | Admit: 2021-03-12 | Discharge: 2021-03-12 | Disposition: A | Discharge status: Home / Self Care | Payer: Medicare Other | Source: Ambulatory Visit | Attending: Surgery | Admitting: Surgery

## 2021-03-12 ENCOUNTER — Other Ambulatory Visit: Payer: Self-pay

## 2021-03-12 DIAGNOSIS — C50911 Malignant neoplasm of unspecified site of right female breast: Secondary | ICD-10-CM

## 2021-03-12 IMAGING — MR MR BREAST BILAT WO/W CM
7 of 9 series · 32 of 48 positions shown · IV contrast (10 mL Gadavist)
Comparison: [DATE] breast MR, [DATE] mammogram/ultrasound
and prior studies

CLINICAL DATA: 69-year-old female with known multicentric RIGHT
breast cancer. Evaluate neoadjuvant treatment response.

LABS:  None performed today
EXAM:
BILATERAL BREAST MRI WITH AND WITHOUT CONTRAST
TECHNIQUE: Multiplanar, multisequence MR images of both breasts were obtained
prior to and following the intravenous administration of 10 ml of
Gadavist

[Series 2: t2_tirm_tra ipat (a-p) · axial · 3.0mm · 0.82mm/px · 1 of 57 slices shown]
[im 1/57]
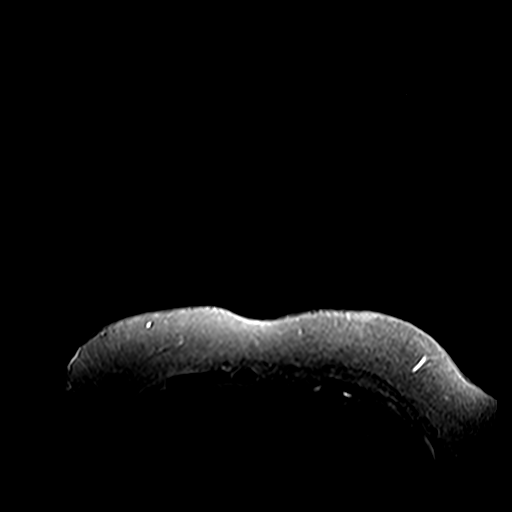

[Series 3: fl3d pre-cm no · axial · non-contrast · 1.2mm · 1.09mm/px · z∈[-45,+127]mm · 5 of 144 slices shown]
[im 1/144]
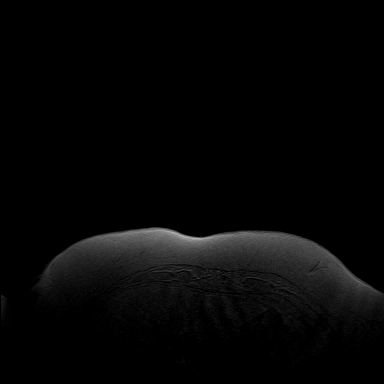
[im 36/144]
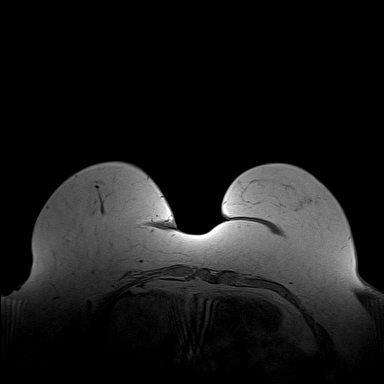
[im 72/144]
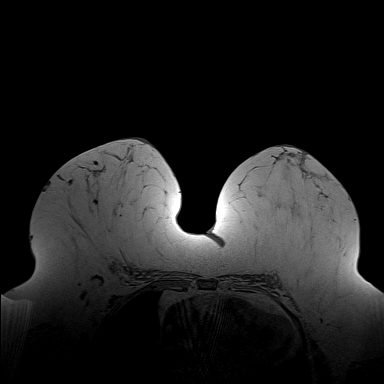
[im 108/144]
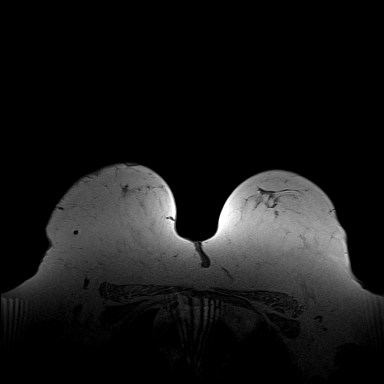
[im 144/144]
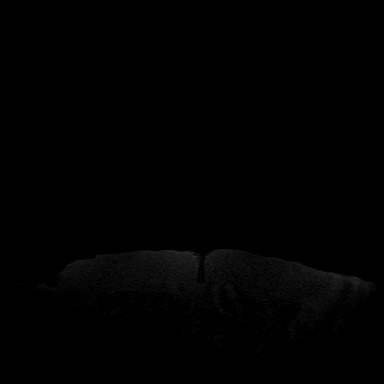

[Series 4: fl3d pre-cm · axial · non-contrast · 1.2mm · 1.09mm/px · z∈[-45,+127]mm · 6 of 144 slices shown]
[im 1/144]
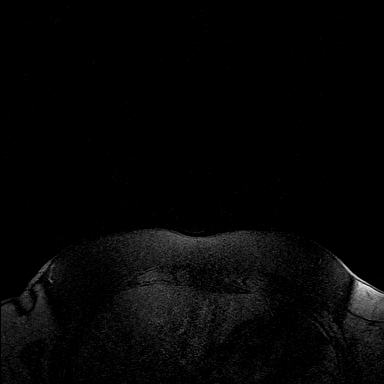
[im 29/144]
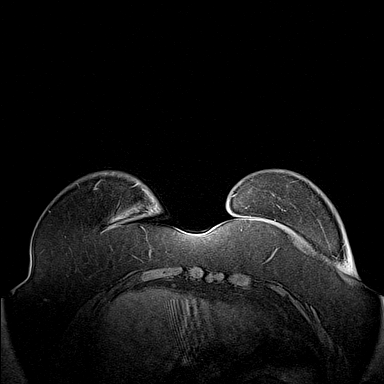
[im 58/144]
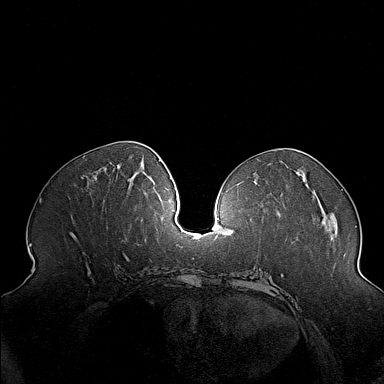
[im 86/144]
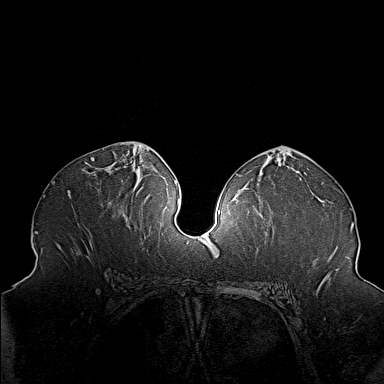
[im 115/144]
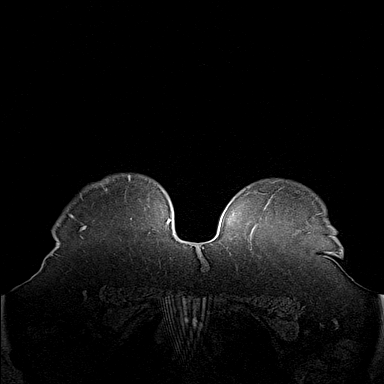
[im 144/144]
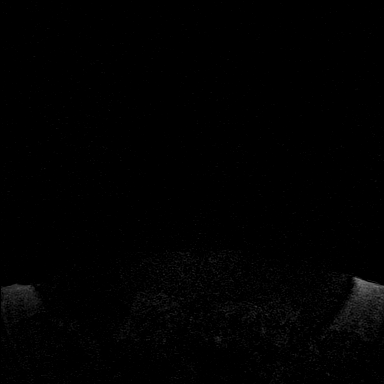

[Series 5: fl3d post-cm 20 · axial · 1.2mm · 1.09mm/px · z∈[-45,+127]mm · 6 of 144 slices shown (1 of 2)]
[im 1/144]
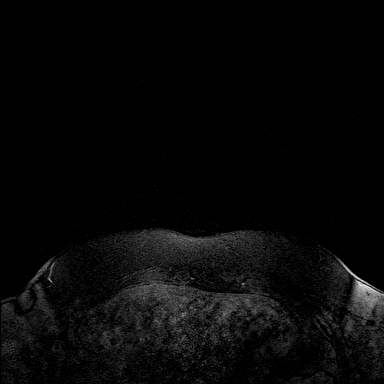
[im 29/144]
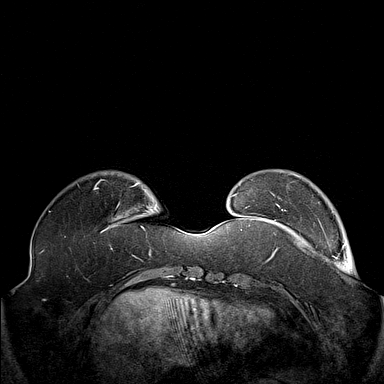
[im 58/144]
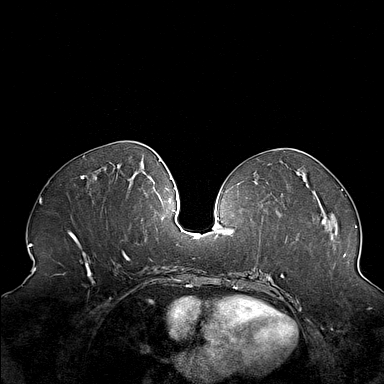
[im 86/144]
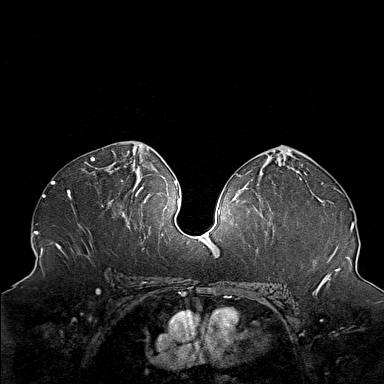
[im 115/144]
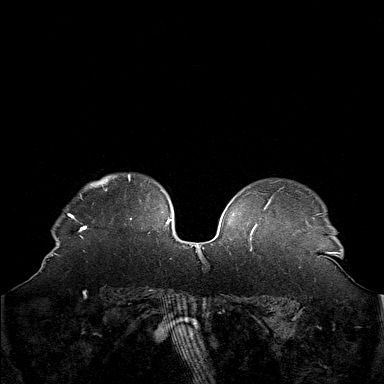
[im 144/144]
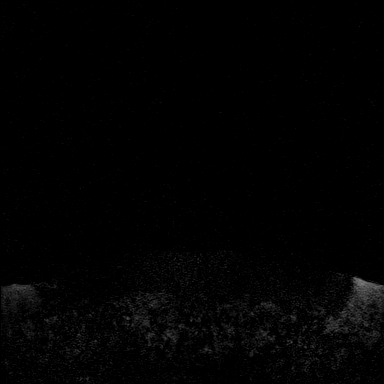

[Series 6: fl3d post-cm 20 · axial · 1.2mm · 1.09mm/px · z∈[-45,+127]mm · 6 of 144 slices shown (2 of 2)]
[im 1/144]
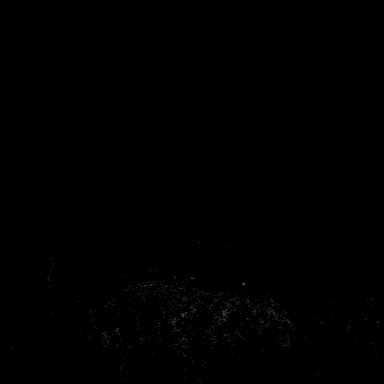
[im 29/144]
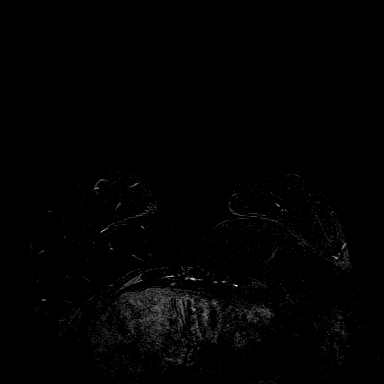
[im 58/144]
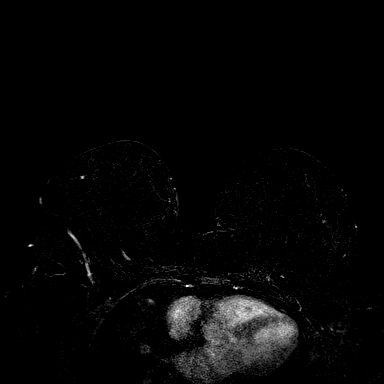
[im 86/144]
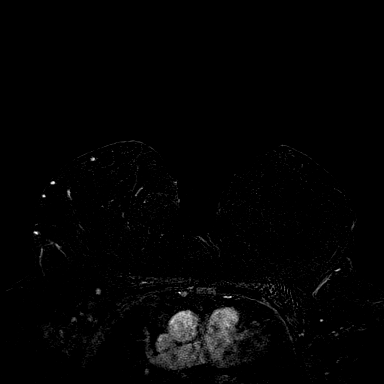
[im 115/144]
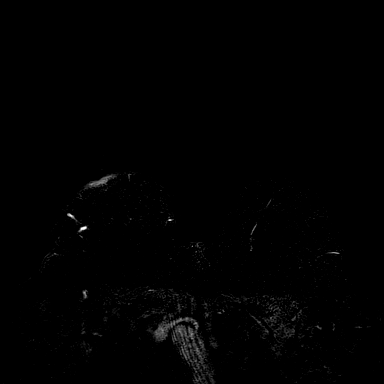
[im 144/144]
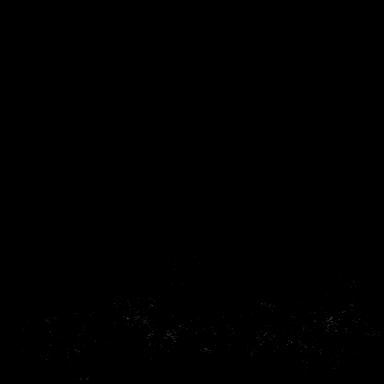

[Series 8: fl3d post-cm 3min · axial · 1.2mm · 1.09mm/px · z∈[-45,+127]mm · 6 of 144 slices shown]
[im 1/144]
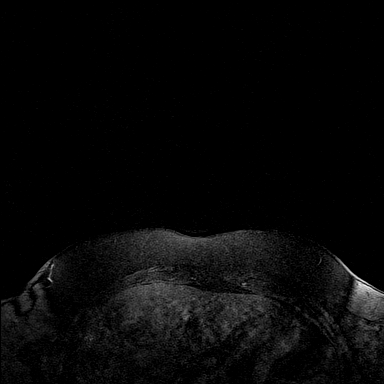
[im 29/144]
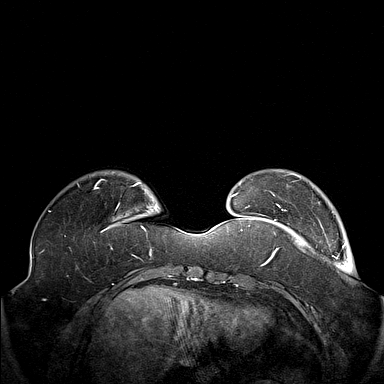
[im 58/144]
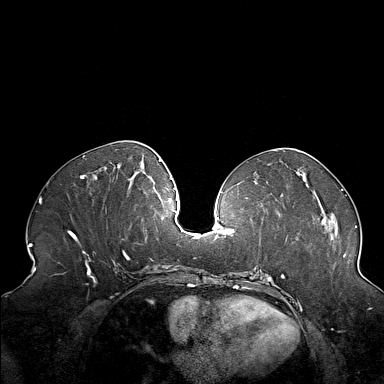
[im 86/144]
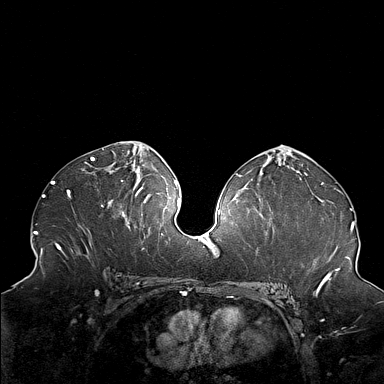
[im 115/144]
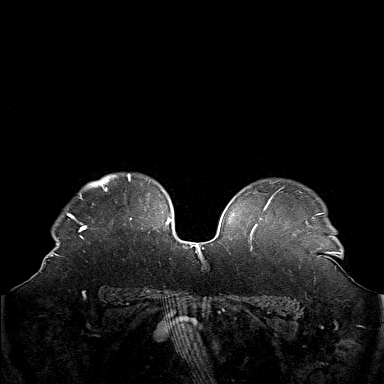
[im 144/144]
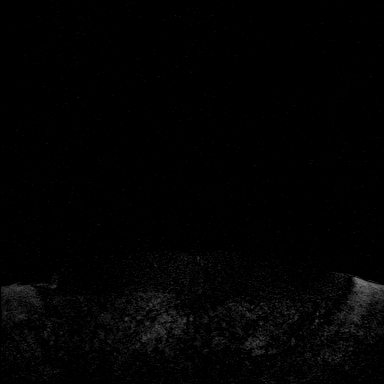

[Series 9: fl3d post-cm 3min_sub · axial · 1.2mm · 1.09mm/px · z∈[-45,-11]mm · 2 of 144 slices shown]
[im 1/144]
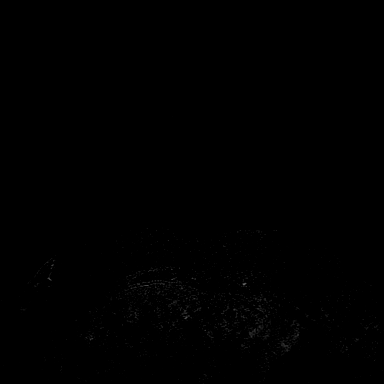
[im 29/144]
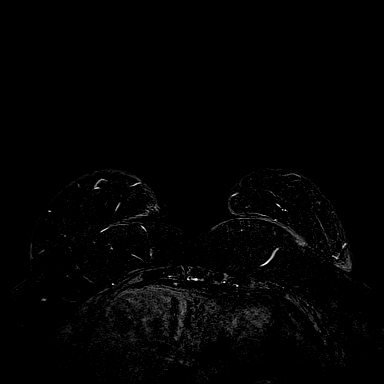

[32 of 48 positions shown; findings below may reference images not displayed]

Three-dimensional MR images were rendered by post-processing of the
original MR data on an independent workstation. The
three-dimensional MR images were interpreted, and findings are
reported in the following complete MRI report for this study. Three
dimensional images were evaluated at the independent interpreting
workstation using the DynaCAD thin client.
FINDINGS: Breast composition: b. Scattered fibroglandular tissue.

Background parenchymal enhancement: Minimal

Right breast: Biopsy clip artifacts within the posterior UPPER INNER
RIGHT breast, mid depth UPPER OUTER RIGHT breast and anterior UPPER
RIGHT breast are identified.

Small masses/clumped non masslike enhancement scattered within the
central and UPPER RIGHT breast are again identified encompassing a
similar sized area overall, but the small masses/clumped non
masslike enhancement have slightly decreased in size with the
following index masses:

An OUTER central middle depth mass now measuring 0.3 cm (series
6:image 73), previously 0.6 cm on [DATE].

An UPPER INNER middle depth mass now measuring 0.3 cm ([DATE]),
previously 0.5 cm.

Please note that the entire area of abnormal enhancement/known
malignancy spans a distance of at least 8 cm within the RIGHT
breast.

Left breast: No suspicious LEFT breast findings are noted. Nodular
density within the OUTER LEFT breast on prior and remote mammograms
correlate to MR finding.

Lymph nodes: No abnormal appearing lymph nodes.

Ancillary findings:  None
IMPRESSION: 1. Scattered areas of biopsy-proven malignancy within the UPPER
RIGHT breast again identified, with individual small masses/clumped
non masslike enhancement decreased in size but the overall area
involved remains the same (minimum of 8 cm).
2. No suspicious LEFT breast findings.
3. No abnormal lymph nodes identified.

RECOMMENDATION:
Treatment plan

BI-RADS CATEGORY  6: Known biopsy-proven malignancy.

## 2021-03-12 MED ORDER — GADOBUTROL 1 MMOL/ML IV SOLN
10.0000 mL | Freq: Once | INTRAVENOUS | Status: AC | PRN
  Administered 2021-03-12: 10 mL via INTRAVENOUS

## 2021-03-16 DIAGNOSIS — Z299 Encounter for prophylactic measures, unspecified: Secondary | ICD-10-CM | POA: Diagnosis not present

## 2021-03-16 DIAGNOSIS — E1165 Type 2 diabetes mellitus with hyperglycemia: Secondary | ICD-10-CM | POA: Diagnosis not present

## 2021-03-16 DIAGNOSIS — N1831 Chronic kidney disease, stage 3a: Secondary | ICD-10-CM | POA: Diagnosis not present

## 2021-03-16 DIAGNOSIS — I10 Essential (primary) hypertension: Secondary | ICD-10-CM | POA: Diagnosis not present

## 2021-03-16 DIAGNOSIS — E113293 Type 2 diabetes mellitus with mild nonproliferative diabetic retinopathy without macular edema, bilateral: Secondary | ICD-10-CM | POA: Diagnosis not present

## 2021-03-18 ENCOUNTER — Ambulatory Visit: Payer: Self-pay | Admitting: Surgery

## 2021-03-18 DIAGNOSIS — C50911 Malignant neoplasm of unspecified site of right female breast: Secondary | ICD-10-CM

## 2021-03-18 NOTE — H&P (View-Only) (Signed)
Patient returns for follow-up after neoadjuvant chemotherapy for her right breast multifocal invasive ductal carcinoma. She has completed chemotherapy. She has not had an magnetic resonance imaging yet. We initially discussed mastectomy first saw her 4 months ago. She is refusing mastectomy at this point. Discussed breast conserving surgery as well. She appears to be against any surgical intervention except for her port being removed. We discussed this is not the standard of care. Ulcer outcome very poor from this with a low chance of curative treatment. She appears to understand this. She has lost her hair. The port has a a blood clot and she states it hurts when she lays on it.  The patient is a 70 year old female.   Allergies Jess Barters, CMA; 03/08/2021 9:02 AM) Doxycycline *DERMATOLOGICALS*  metroNIDAZOLE *ANTI-INFECTIVE AGENTS - MISC.*  Allergies Reconciled   Medication History Jess Barters, CMA; 03/08/2021 9:03 AM) oxyCODONE HCl (5MG  Tablet, Oral) Active. Albuterol Sulfate HFA (108 (90 Base)MCG/ACT Aerosol Soln, Inhalation) Active. Eliquis (5MG  Tablet, Oral) Active. Cephalexin (500MG  Capsule, Oral) Active. Sodium Chloride (PF) (0.9% Solution, Injection) Active. Lisinopril (20MG  Tablet, Oral) Active. Escitalopram Oxalate (10MG  Tablet, Oral) Active. Esomeprazole Magnesium (40MG  Capsule DR, Oral) Active. Glimepiride (2MG  Tablet, Oral) Active. hydroCHLOROthiazide (25MG  Tablet, Oral) Active. metFORMIN HCl (1000MG  Tablet, Oral) Active. Metoprolol Tartrate (50MG  Tablet, Oral) Active. Lisinopril (5MG  Tablet, Oral) Active. Rosuvastatin Calcium (5MG  Tablet, Oral) Active. valACYclovir HCl (1GM Tablet, Oral) Active. Medications Reconciled  Vitals Jess Barters CMA; 03/08/2021 9:03 AM) 03/08/2021 9:03 AM Weight: 270.38 lb Height: 60in Body Surface Area: 2.12 m Body Mass Index: 52.8 kg/m  Temp.: 98.49F  Pulse: 116 (Regular)  P.OX: 97% (Room air) BP:  160/98(Sitting, Left Arm, Standard)       Physical Exam (Raynetta Osterloh A. Lirio Bach MD; 03/08/2021 9:42 AM) General Mental Status-Alert. General Appearance-Consistent with stated age. Hydration-Well hydrated. Voice-Normal.  Chest and Lung Exam Note: Right-sided port is intact.   Breast Breast - Left-Symmetric, Non Tender, No Biopsy scars, no Dimpling - Left, No Inflammation, No Lumpectomy scars, No Mastectomy scars, No Peau d' Orange. Breast - Right-Symmetric, Non Tender, No Biopsy scars, no Dimpling - Right, No Inflammation, No Lumpectomy scars, No Mastectomy scars, No Peau d' Orange. Breast Lump-No Palpable Breast Mass.  Lymphatic Axillary  General Axillary Region: Bilateral - Description - Normal. Tenderness - Non Tender.    Assessment & Plan (Onnie Hatchel A. Edla Para MD; 03/08/2021 9:40 AM) BREAST CANCER, RIGHT (C50.911) Impression: total time 25 minutes   Multifocal nature. She  has completed neoadjuvant chemotherapy. Reviewed magnetic resonance imaging and the area measures 8 cm in maximal diameter but there is some loss of density but this is multifocal appearance.  This is amenable to lumpectomy but  will be quite large and there could be issues with cosmesis. Patient has refused surgery altogether in the past and hopefully this will be admitted around.  She will require radiation therapy.  I'm not show complete that at least by excision we can have some local control versus no surgery at all. Plan will be discussedwith the patient and ifshe agrees to lumpectomy we can proceed with that and sentinel lymph mapping and port removal since she longer desires to have her port placed.  Discussed mastectomy as well but given the magnetic resonance imaging findings emphasize the think it is possible due to extended bracketed lumpectomy being removed the area as well as doing a sentinel lymph node mapping procedure.  The patient is agreeable and we'll proceed with the above  plan.  Current Plans Pt Education - [employer] Free Text Education/Instructions: discussed with patient and provided information.

## 2021-03-18 NOTE — H&P (Signed)
Patient returns for follow-up after neoadjuvant chemotherapy for her right breast multifocal invasive ductal carcinoma. She has completed chemotherapy. She has not had an magnetic resonance imaging yet. We initially discussed mastectomy first saw her 4 months ago. She is refusing mastectomy at this point. Discussed breast conserving surgery as well. She appears to be against any surgical intervention except for her port being removed. We discussed this is not the standard of care. Ulcer outcome very poor from this with a low chance of curative treatment. She appears to understand this. She has lost her hair. The port has a a blood clot and she states it hurts when she lays on it.  The patient is a 70 year old female.   Allergies Jess Barters, CMA; 03/08/2021 9:02 AM) Doxycycline *DERMATOLOGICALS*  metroNIDAZOLE *ANTI-INFECTIVE AGENTS - MISC.*  Allergies Reconciled   Medication History Jess Barters, CMA; 03/08/2021 9:03 AM) oxyCODONE HCl (5MG  Tablet, Oral) Active. Albuterol Sulfate HFA (108 (90 Base)MCG/ACT Aerosol Soln, Inhalation) Active. Eliquis (5MG  Tablet, Oral) Active. Cephalexin (500MG  Capsule, Oral) Active. Sodium Chloride (PF) (0.9% Solution, Injection) Active. Lisinopril (20MG  Tablet, Oral) Active. Escitalopram Oxalate (10MG  Tablet, Oral) Active. Esomeprazole Magnesium (40MG  Capsule DR, Oral) Active. Glimepiride (2MG  Tablet, Oral) Active. hydroCHLOROthiazide (25MG  Tablet, Oral) Active. metFORMIN HCl (1000MG  Tablet, Oral) Active. Metoprolol Tartrate (50MG  Tablet, Oral) Active. Lisinopril (5MG  Tablet, Oral) Active. Rosuvastatin Calcium (5MG  Tablet, Oral) Active. valACYclovir HCl (1GM Tablet, Oral) Active. Medications Reconciled  Vitals Jess Barters CMA; 03/08/2021 9:03 AM) 03/08/2021 9:03 AM Weight: 270.38 lb Height: 60in Body Surface Area: 2.12 m Body Mass Index: 52.8 kg/m  Temp.: 98.85F  Pulse: 116 (Regular)  P.OX: 97% (Room air) BP:  160/98(Sitting, Left Arm, Standard)       Physical Exam (Latham Kinzler A. Orion Vandervort MD; 03/08/2021 9:42 AM) General Mental Status-Alert. General Appearance-Consistent with stated age. Hydration-Well hydrated. Voice-Normal.  Chest and Lung Exam Note: Right-sided port is intact.   Breast Breast - Left-Symmetric, Non Tender, No Biopsy scars, no Dimpling - Left, No Inflammation, No Lumpectomy scars, No Mastectomy scars, No Peau d' Orange. Breast - Right-Symmetric, Non Tender, No Biopsy scars, no Dimpling - Right, No Inflammation, No Lumpectomy scars, No Mastectomy scars, No Peau d' Orange. Breast Lump-No Palpable Breast Mass.  Lymphatic Axillary  General Axillary Region: Bilateral - Description - Normal. Tenderness - Non Tender.    Assessment & Plan (Teigen Parslow A. Rettie Laird MD; 03/08/2021 9:40 AM) BREAST CANCER, RIGHT (C50.911) Impression: total time 25 minutes   Multifocal nature. She  has completed neoadjuvant chemotherapy. Reviewed magnetic resonance imaging and the area measures 8 cm in maximal diameter but there is some loss of density but this is multifocal appearance.  This is amenable to lumpectomy but  will be quite large and there could be issues with cosmesis. Patient has refused surgery altogether in the past and hopefully this will be admitted around.  She will require radiation therapy.  I'm not show complete that at least by excision we can have some local control versus no surgery at all. Plan will be discussedwith the patient and ifshe agrees to lumpectomy we can proceed with that and sentinel lymph mapping and port removal since she longer desires to have her port placed.  Discussed mastectomy as well but given the magnetic resonance imaging findings emphasize the think it is possible due to extended bracketed lumpectomy being removed the area as well as doing a sentinel lymph node mapping procedure.  The patient is agreeable and we'll proceed with the above  plan.  Current Plans Pt Education - [employer] Free Text Education/Instructions: discussed with patient and provided information.

## 2021-03-23 ENCOUNTER — Ambulatory Visit: Payer: Self-pay | Admitting: Surgery

## 2021-03-23 DIAGNOSIS — C50811 Malignant neoplasm of overlapping sites of right female breast: Secondary | ICD-10-CM | POA: Diagnosis not present

## 2021-03-23 DIAGNOSIS — Z515 Encounter for palliative care: Secondary | ICD-10-CM | POA: Diagnosis not present

## 2021-03-23 DIAGNOSIS — Z170 Estrogen receptor positive status [ER+]: Secondary | ICD-10-CM | POA: Diagnosis not present

## 2021-03-23 DIAGNOSIS — Z7901 Long term (current) use of anticoagulants: Secondary | ICD-10-CM | POA: Diagnosis not present

## 2021-03-24 ENCOUNTER — Other Ambulatory Visit: Payer: Self-pay

## 2021-03-24 ENCOUNTER — Emergency Department (HOSPITAL_COMMUNITY)
Admission: EM | Admit: 2021-03-24 | Discharge: 2021-03-24 | Disposition: A | Discharge status: Home / Self Care | Payer: Medicare Other | Attending: Emergency Medicine | Admitting: Emergency Medicine

## 2021-03-24 ENCOUNTER — Ambulatory Visit: Payer: Self-pay | Admitting: Surgery

## 2021-03-24 ENCOUNTER — Encounter (HOSPITAL_COMMUNITY): Payer: Self-pay | Admitting: *Deleted

## 2021-03-24 DIAGNOSIS — Z79899 Other long term (current) drug therapy: Secondary | ICD-10-CM | POA: Diagnosis not present

## 2021-03-24 DIAGNOSIS — Z853 Personal history of malignant neoplasm of breast: Secondary | ICD-10-CM | POA: Insufficient documentation

## 2021-03-24 DIAGNOSIS — N183 Chronic kidney disease, stage 3 unspecified: Secondary | ICD-10-CM | POA: Insufficient documentation

## 2021-03-24 DIAGNOSIS — J45909 Unspecified asthma, uncomplicated: Secondary | ICD-10-CM | POA: Diagnosis not present

## 2021-03-24 DIAGNOSIS — Z7984 Long term (current) use of oral hypoglycemic drugs: Secondary | ICD-10-CM | POA: Insufficient documentation

## 2021-03-24 DIAGNOSIS — Z87891 Personal history of nicotine dependence: Secondary | ICD-10-CM | POA: Diagnosis not present

## 2021-03-24 DIAGNOSIS — I129 Hypertensive chronic kidney disease with stage 1 through stage 4 chronic kidney disease, or unspecified chronic kidney disease: Secondary | ICD-10-CM | POA: Insufficient documentation

## 2021-03-24 DIAGNOSIS — Z7901 Long term (current) use of anticoagulants: Secondary | ICD-10-CM | POA: Diagnosis not present

## 2021-03-24 DIAGNOSIS — E1122 Type 2 diabetes mellitus with diabetic chronic kidney disease: Secondary | ICD-10-CM | POA: Diagnosis not present

## 2021-03-24 DIAGNOSIS — M5481 Occipital neuralgia: Secondary | ICD-10-CM | POA: Diagnosis not present

## 2021-03-24 DIAGNOSIS — R519 Headache, unspecified: Secondary | ICD-10-CM | POA: Diagnosis not present

## 2021-03-24 MED ORDER — APIXABAN 5 MG PO TABS: 5.0000 mg | ORAL_TABLET | Freq: Two times a day (BID) | ORAL | 2 refills | Status: DC

## 2021-03-24 MED ORDER — LIDOCAINE-EPINEPHRINE (PF) 2 %-1:200000 IJ SOLN
20.0000 mL | Freq: Once | INTRAMUSCULAR | Status: DC
  Filled 2021-03-24: qty 20

## 2021-03-24 MED ORDER — PREDNISONE 20 MG PO TABS: 20.0000 mg | ORAL_TABLET | Freq: Every day | ORAL | 0 refills | Status: DC

## 2021-03-24 NOTE — Discharge Instructions (Addendum)
The injection should start to work and continue to give pain relief today and possibly longer Take prednisone daily for 5 days I have refilled your apixaban "Eliquis" prescription for your blood thinner Please follow-up with your doctor this week See the phone number for the neurologist above if you are still having headaches in 1 week Emergency department for severe or worsening symptoms

## 2021-03-24 NOTE — ED Triage Notes (Signed)
Pt c/o pinched nerve to back right side of neck that is causing pain into the right side of her head. Pt reports she was given steroids and muscle relaxers by her PCP on 03/16/21 but reports the pain started again last night.

## 2021-03-24 NOTE — ED Provider Notes (Signed)
[facility] EMERGENCY DEPARTMENT Provider Note   CSN: [id number] Arrival date & time: 03/24/21  0857     History Chief Complaint  Patient presents with  . Headache    Beth Phelps is a 70 y.o. female.  HPI   This patient is a 70 year old female with a history of breast cancer, she has type 2 diabetes and stage III chronic kidney disease as well as hypertension and hyperlipidemia.  She presents to the hospital with a complaint of a headache which is located posterior to the right ear over the mastoid and the neck on the right cervical area.  She reports that she has had this pain ever since having her breast MRI which was performed approximately 10 days ago.  The patient reports that the day after being in the MRI machine she developed this pain in this local area.  It seemed to get better when her family doctor prescribed prednisone but then came back when the prednisone finished.  She has tenderness with touching this area but denies any associated rashes and has no numbness or weakness of the arm, she has no changes in vision or speech.  The pain is constant, seem to come back yesterday, did not get better when taking a muscle relaxer for it.  She has never had pain like this in the past.  Past Medical History:  Diagnosis Date  . Anxiety   . Breast cancer ([place])   . Chronic pain   . Diabetes mellitus    type 2  . Fatty liver   . GERD (gastroesophageal reflux disease)   . Herpes   . Hiatal hernia 11/15/2004   small  . History of asthma   . History of sleep apnea   . HTN (hypertension)   . Hyperlipidemia   . Migraine   . Obesity   . Sleep apnea    no CPAP  . Stage 3 chronic kidney disease ([place])   . Type 2 diabetes mellitus [facility])     Patient Active Problem List   Diagnosis Date Noted  . Unspecified constipation 06/18/2014  . Fatty liver 06/18/2014  . UTI (urinary tract infection) 05/09/2014  . Ischemic colitis ([place]) 05/09/2014  . GI bleed 05/07/2014  . Rectal bleeding  05/07/2014  . DM 08/24/2010  . HYPERLIPIDEMIA 08/24/2010  . HYPERTENSION 08/24/2010  . ASTHMA 08/24/2010  . GERD 08/24/2010  . UNSPECIFIED SLEEP APNEA 08/24/2010  . CHEST PAIN UNSPECIFIED 08/24/2010    Past Surgical History:  Procedure Laterality Date  . ABDOMINAL HYSTERECTOMY    . BLADDER SURGERY    . CHOLECYSTECTOMY    . COLONOSCOPY  09/2012   Dr. Anthony Sar: normal. H/O prior polyps.  . COLONOSCOPY N/A 07/02/2014   Procedure: COLONOSCOPY;  Surgeon: Daneil Dolin, MD;  Location[facility];  Service: Endoscopy;  Laterality: N/A;  7:30  . ESOPHAGOGASTRODUODENOSCOPY  11/15/2004   Dr. Laural Golden- small sliding hiatal hernia with mild changes of reflux esophagitis  . MOLE REMOVAL     malignant  . PORTACATH PLACEMENT N/A 11/10/2020   Procedure: INSERTION PORT-A-CATH WITH ULTRASOUND GUIDANCE;  Surgeon: Erroll Luna, MD;  Location: [facility];  Service: General;  Laterality: N/A;     OB History   No obstetric history on file.     Family History  Problem Relation Age of Onset  . Diabetes Father   . Colon polyps Father   . Heart disease Neg Hx   . Colon cancer Neg Hx     Social History  Tobacco Use  . Smoking status: Former Smoker    Packs/day: 1.00    Years: 20.00    Pack years: 20.00    Types: Cigarettes    Quit date: 07/02/1994    Years since quitting: 26.7  . Smokeless tobacco: Never Used  Vaping Use  . Vaping Use: Never used  Substance Use Topics  . Alcohol use: No  . Drug use: No    Home Medications Prior to Admission medications   Medication Sig Start Date End Date Taking? Authorizing Provider  albuterol (VENTOLIN HFA) 108 (90 Base) MCG/ACT inhaler Inhale 1-2 puffs into the lungs every 6 (six) hours as needed for wheezing or shortness of breath. 01/18/21  Yes [provider]  ALPRAZolam Duanne Moron) 0.5 MG tablet Take 0.5 mg by mouth 2 (two) times daily as needed for anxiety or sleep. 01/22/20  Yes [provider]  cyclobenzaprine (FLEXERIL) 10 MG  tablet Take 1 tablet by mouth every 8 (eight) hours as needed for muscle spasms. 03/16/21  Yes [provider]  esomeprazole (NEXIUM) 40 MG capsule Take 40 mg by mouth daily before breakfast.   Yes [provider]  glimepiride (AMARYL) 2 MG tablet Take 2 mg by mouth daily with breakfast.   Yes [provider]  hydrochlorothiazide 25 MG tablet Take 25 mg by mouth daily as needed (ankle swelling).   Yes [provider]  lisinopril (ZESTRIL) 5 MG tablet Take 20 mg by mouth daily.   Yes [provider]  metFORMIN (GLUCOPHAGE) 1000 MG tablet Take 1,000 mg by mouth 2 (two) times daily with a meal.   Yes [provider]  metoprolol tartrate (LOPRESSOR) 50 MG tablet Take 50 mg by mouth 2 (two) times daily.   Yes [provider]  predniSONE (DELTASONE) 20 MG tablet Take 1 tablet (20 mg total) by mouth daily. 03/24/21  Yes Noemi Chapel, MD  rosuvastatin (CRESTOR) 5 MG tablet Take 5 mg by mouth every Wednesday. 06/10/20  Yes [provider]  apixaban (ELIQUIS) 5 MG TABS tablet Take 1 tablet (5 mg total) by mouth in the morning and at bedtime. 03/24/21 06/22/21  Noemi Chapel, MD    Allergies    Metronidazole and Doxycycline  Review of Systems   Review of Systems  All other systems reviewed and are negative.   Physical Exam Updated Vital Signs BP 126/65   Pulse 77   Temp 97.8 F (36.6 C) (Oral)   Resp 18   Ht 1.524 m (5')   Wt 117.9 kg   SpO2 100%   BMI 50.78 kg/m   Physical Exam Vitals and nursing note reviewed.  Constitutional:      General: She is not in acute distress.    Appearance: She is well-developed.  HENT:     Head: Normocephalic and atraumatic.     Comments: There is tenderness to palpation in the right occipital scalp posterior to the ear and just above the cervical neck.    Mouth/Throat:     Mouth: Mucous membranes are moist.     Pharynx: No oropharyngeal exudate.  Eyes:     General: No scleral icterus.        Right eye: No discharge.        Left eye: No discharge.     Conjunctiva/sclera: Conjunctivae normal.     Pupils: Pupils are equal, round, and reactive to light.  Neck:     Thyroid: No thyromegaly.     Vascular: No JVD.  Cardiovascular:  Rate and Rhythm: Normal rate and regular rhythm.     Heart sounds: Normal heart sounds. No murmur heard. No friction rub. No gallop.   Pulmonary:     Effort: Pulmonary effort is normal. No respiratory distress.     Breath sounds: Normal breath sounds. No wheezing or rales.  Abdominal:     General: Bowel sounds are normal. There is no distension.     Palpations: Abdomen is soft. There is no mass.     Tenderness: There is no abdominal tenderness.  Musculoskeletal:        General: No tenderness. Normal range of motion.     Cervical back: Normal range of motion and neck supple.  Lymphadenopathy:     Cervical: No cervical adenopathy.  Skin:    General: Skin is warm and dry.     Findings: No erythema or rash.  Neurological:     Mental Status: She is alert.     Coordination: Coordination normal.     Comments: Able to follow commands, she moves all 4 extremities without difficulty, she has normal strength in all 4 extremities, speech is normal, cranial nerves III through XII appear normal  Psychiatric:        Behavior: Behavior normal.     ED Results / Procedures / Treatments   Labs (all labs ordered are listed, but only abnormal results are displayed) Labs Reviewed - No data to display  EKG None  Radiology No results found.  Procedures .Nerve Block  Date/Time: 03/24/2021 11:47 AM Performed by: Noemi Chapel, MD Authorized by: Noemi Chapel, MD   Consent:    Consent obtained:  Verbal   Consent given by:  Patient   Risks, benefits, and alternatives were discussed: yes     Risks discussed:  Allergic reaction, infection, intravenous injection, bleeding, nerve damage, pain, swelling and unsuccessful block   Alternatives discussed:   Alternative treatment Universal protocol:    Procedure explained and questions answered to patient or proxy's satisfaction: yes     Relevant documents present and verified: yes     Site/side marked: yes     Immediately prior to procedure, a time out was called: yes     Patient identity confirmed:  Verbally with patient Indications:    Indications:  Pain relief Location:    Body area:  Head   Head nerve:  Occipital   Laterality:  Right Pre-procedure details:    Skin preparation:  Povidone-iodine   Preparation: Patient was prepped and draped in usual sterile fashion   Skin anesthesia:    Skin anesthesia method:  Local infiltration   Local anesthetic:  Lidocaine 1% WITH epi Procedure details:    Block needle gauge:  25 G   Anesthetic injected:  Lidocaine 1% WITH epi   Steroid injected:  None   Additive injected:  None   Injection procedure:  Anatomic landmarks identified, incremental injection, introduced needle, anatomic landmarks palpated and negative aspiration for blood   Paresthesia:  None Post-procedure details:    Dressing:  None   Outcome:  Pain improved   Procedure completion:  Tolerated Comments:           Medications Ordered in ED Medications  lidocaine-EPINEPHrine (XYLOCAINE W/EPI) 2 %-1:200000 (PF) injection 20 mL (has no administration in time range)    ED Course  I have reviewed the triage vital signs and the nursing notes.  Pertinent labs & imaging results that were available during my care of the patient were reviewed by me and considered  in my medical decision making (see chart for details).    MDM Rules/Calculators/A&P                          No rash in this area, no signs of zoster, tenderness to palpation suggestive of possible neurologic pain, no masses, no lymphadenopathy, no neurologic symptoms.  We will trial trigger point injection - pt states that she has been told that her PCP, Dr. Woody Seller in [place] is referring her to Neurology.  Occipital  nerve block performed, patient tolerated well  Again no other signs that this could be pathological intracranial, injection given, patient tolerated, stable for discharge  Final Clinical Impression(s) / ED Diagnoses Final diagnoses:  Occipital headache    Rx / DC Orders ED Discharge Orders         Ordered    predniSONE (DELTASONE) 20 MG tablet  Daily        03/24/21 1150    apixaban (ELIQUIS) 5 MG TABS tablet  2 times daily        03/24/21 1150           Noemi Chapel, MD 03/24/21 1152

## 2021-03-25 DIAGNOSIS — N390 Urinary tract infection, site not specified: Secondary | ICD-10-CM | POA: Diagnosis not present

## 2021-03-25 DIAGNOSIS — R519 Headache, unspecified: Secondary | ICD-10-CM | POA: Diagnosis not present

## 2021-03-25 DIAGNOSIS — Z299 Encounter for prophylactic measures, unspecified: Secondary | ICD-10-CM | POA: Diagnosis not present

## 2021-03-29 DIAGNOSIS — C50811 Malignant neoplasm of overlapping sites of right female breast: Secondary | ICD-10-CM | POA: Diagnosis not present

## 2021-03-29 DIAGNOSIS — Z170 Estrogen receptor positive status [ER+]: Secondary | ICD-10-CM | POA: Diagnosis not present

## 2021-03-30 NOTE — Progress Notes (Signed)
Surgical Instructions    Your procedure is scheduled on 04/07/21.  Report to [facility] Main Entrance "A" at 11:30 A.M., then check in with the Admitting office.  Call this number if you have problems the morning of surgery:  [phone]   If you have any questions prior to your surgery date call [phone]: Open Monday-Friday 8am-4pm    Remember:  Do not eat after midnight the night before your surgery  You may drink clear liquids until 10:30 the morning of your surgery.   Clear liquids allowed are: Water, Non-Citrus Juices (without pulp), Carbonated Beverages, Clear Tea, Black Coffee Only, and Gatorade  Please complete your PRE-SURGERY bottle of water that was provided to you by 10:30the morning of surgery.  Please, if able, drink it in one setting. DO NOT SIP.     Take these medicines the morning of surgery with A SIP OF WATER: esomeprazole (NEXIUM) metoprolol tartrate (LOPRESSOR) predniSONE (DELTASONE)  rosuvastatin (CRESTOR)   If needed: albuterol (VENTOLIN HFA) ALPRAZolam (XANAX) cyclobenzaprine (FLEXERIL)  WHAT DO I DO ABOUT MY DIABETES MEDICATION?   Do not take oral diabetes medicines (pills) the morning of surgery.   HOW TO MANAGE YOUR DIABETES BEFORE AND AFTER SURGERY  Why is it important to control my blood sugar before and after surgery? Improving blood sugar levels before and after surgery helps healing and can limit problems. A way of improving blood sugar control is eating a healthy diet by:  Eating less sugar and carbohydrates  Increasing activity/exercise  Talking with your doctor about reaching your blood sugar goals High blood sugars (greater than 180 mg/dL) can raise your risk of infections and slow your recovery, so you will need to focus on controlling your diabetes during the weeks before surgery. Make sure that the doctor who takes care of your diabetes knows about your planned surgery including the date and location.  How do I manage my blood  sugar before surgery? Check your blood sugar at least 4 times a day, starting 2 days before surgery, to make sure that the level is not too high or low.  Check your blood sugar the morning of your surgery when you wake up and every 2 hours until you get to the Short Stay unit.  If your blood sugar is less than 70 mg/dL, you will need to treat for low blood sugar: Do not take insulin. Treat a low blood sugar (less than 70 mg/dL) with  cup of clear juice (cranberry or apple), 4 glucose tablets, OR glucose gel. Recheck blood sugar in 15 minutes after treatment (to make sure it is greater than 70 mg/dL). If your blood sugar is not greater than 70 mg/dL on recheck, call [phone] for further instructions. Report your blood sugar to the short stay nurse when you get to Short Stay.  If you are admitted to the hospital after surgery: Your blood sugar will be checked by the staff and you will probably be given insulin after surgery (instead of oral diabetes medicines) to make sure you have good blood sugar levels. The goal for blood sugar control after surgery is 80-180 mg/dL.    As of today, STOP taking any Aspirin (unless otherwise instructed by your surgeon) Aleve, Naproxen, Ibuprofen, Motrin, Advil, Goody's, BC's, all herbal medications, fish oil, and all vitamins.  Follow your surgeon's instructions on when to stop Eliquis.  If no instructions were given by your surgeon then you will need to call the office to get those instructions.  Do not wear jewelry or makeup Do not wear lotions, powders, perfumes/colognes, or deodorant. Do not shave 48 hours prior to surgery.   Do not bring valuables to the hospital. DO Not wear nail polish, gel polish, artificial nails, or any other type of covering on  natural nails including finger and toenails. If patients have artificial nails, gel coating, etc. that need to be removed by a nail salon please have this removed prior to surgery or surgery  may need to be canceled/delayed if the surgeon/ anesthesia feels like the patient is unable to be adequately monitored.             [facility] is not responsible for any belongings or valuables.  Do NOT Smoke (Tobacco/Vaping) or drink Alcohol 24 hours prior to your procedure If you use a CPAP at night, you may bring all equipment for your overnight stay.   Contacts, glasses, dentures or bridgework may not be worn into surgery, please bring cases for these belongings   For patients admitted to the hospital, discharge time will be determined by your treatment team.   Patients discharged the day of surgery will not be allowed to drive home, and someone needs to stay with them for 24 hours.  ONLY 1 SUPPORT PERSON MAY BE PRESENT WHILE YOU ARE IN SURGERY. IF YOU ARE TO BE ADMITTED ONCE YOU ARE IN YOUR ROOM YOU WILL BE ALLOWED TWO (2) VISITORS.  Minor children may have two parents present. Special consideration for safety and communication needs will be reviewed on a case by case basis.  Special instructions:    Oral Hygiene is also important to reduce your risk of infection.  Remember - BRUSH YOUR TEETH THE MORNING OF SURGERY WITH YOUR REGULAR TOOTHPASTE   [facility]- Preparing For Surgery  Before surgery, you can play an important role. Because skin is not sterile, your skin needs to be as free of germs as possible. You can reduce the number of germs on your skin by washing with CHG (chlorahexidine gluconate) Soap before surgery.  CHG is an antiseptic cleaner which kills germs and bonds with the skin to continue killing germs even after washing.     Please do not use if you have an allergy to CHG or antibacterial soaps. If your skin becomes reddened/irritated stop using the CHG.  Do not shave (including legs and underarms) for at least 48 hours prior to first CHG shower. It is OK to shave your face.  Please follow these instructions carefully.     Shower the NIGHT BEFORE SURGERY and the  MORNING OF SURGERY with CHG Soap.   If you chose to wash your hair, wash your hair first as usual with your normal shampoo. After you shampoo, rinse your hair and body thoroughly to remove the shampoo.  Then [employer] and genitals (private parts) with your normal soap and rinse thoroughly to remove soap.  After that Use CHG Soap as you would any other liquid soap. You can apply CHG directly to the skin and wash gently with a scrungie or a clean washcloth.   Apply the CHG Soap to your body ONLY FROM THE NECK DOWN.  Do not use on open wounds or open sores. Avoid contact with your eyes, ears, mouth and genitals (private parts). Wash Face and genitals (private parts)  with your normal soap.   Wash thoroughly, paying special attention to the area where your surgery will be performed.  Thoroughly rinse your body with warm water from  the neck down.  DO NOT shower/wash with your normal soap after using and rinsing off the CHG Soap.  Pat yourself dry with a CLEAN TOWEL.  Wear CLEAN PAJAMAS to bed the night before surgery  Place CLEAN SHEETS on your bed the night before your surgery  DO NOT SLEEP WITH PETS.   Day of Surgery:  Take a shower with CHG soap. Wear Clean/Comfortable clothing the morning of surgery Do not apply any deodorants/lotions.   Remember to brush your teeth WITH YOUR REGULAR TOOTHPASTE.   Please read over the following fact sheets that you were given.

## 2021-03-31 ENCOUNTER — Encounter (HOSPITAL_COMMUNITY): Payer: Self-pay

## 2021-03-31 ENCOUNTER — Inpatient Hospital Stay (HOSPITAL_COMMUNITY): Admit: 2021-03-31 | Discharge: 2021-03-31 | Disposition: A | Discharge status: Home / Self Care | Payer: Medicare Other

## 2021-03-31 ENCOUNTER — Ambulatory Visit: Payer: Self-pay | Admitting: Surgery

## 2021-03-31 ENCOUNTER — Encounter (HOSPITAL_COMMUNITY): Payer: Self-pay | Admitting: Surgery

## 2021-03-31 NOTE — Progress Notes (Addendum)
   EKG: 12/31/20.  Tried to requested EKG tracing, but patient does not remember where it was done or by whom.  Will get another EKG dos.  CXR: na ECHO: 01/25/17 Stress Test: denies Cardiac Cath: denies  Fasting Blood Sugar- 107 Checks Blood Sugar__1_ times a day  ASA/Blood Thinners: No  OSA/CPAP:  Wears cpap sometimes  Covid test not needed  Anesthesia Review: No  Patient denies shortness of breath, fever, cough, and chest pain at PAT appointment.  Patient verbalized understanding of instructions provided today at the PAT appointment.  Patient asked to review instructions at home and day of surgery.

## 2021-03-31 NOTE — Progress Notes (Signed)
Surgical Instructions    Your procedure is scheduled on 04/07/21.  Report to [facility] Main Entrance "A" at 11:30 A.M., then check in with the Admitting office.  Call this number if you have problems the morning of surgery:  [phone]   If you have any questions prior to your surgery date call [phone]: Open Monday-Friday 8am-4pm    Remember:  Do not eat after midnight the night before your surgery  You may drink clear liquids until 10:30 the morning of your surgery.   Clear liquids allowed are: Water, Non-Citrus Juices (without pulp), Carbonated Beverages, Clear Tea, Black Coffee Only, and Gatorade     Take these medicines the morning of surgery with A SIP OF WATER: esomeprazole (NEXIUM) metoprolol tartrate (LOPRESSOR) predniSONE (DELTASONE)  rosuvastatin (CRESTOR)   If needed: albuterol (VENTOLIN HFA) ALPRAZolam (XANAX) cyclobenzaprine (FLEXERIL)  WHAT DO I DO ABOUT MY DIABETES MEDICATION?   Do not take oral diabetes medicines (pills) the morning of surgery.   HOW TO MANAGE YOUR DIABETES BEFORE AND AFTER SURGERY  Why is it important to control my blood sugar before and after surgery? Improving blood sugar levels before and after surgery helps healing and can limit problems. A way of improving blood sugar control is eating a healthy diet by:  Eating less sugar and carbohydrates  Increasing activity/exercise  Talking with your doctor about reaching your blood sugar goals High blood sugars (greater than 180 mg/dL) can raise your risk of infections and slow your recovery, so you will need to focus on controlling your diabetes during the weeks before surgery. Make sure that the doctor who takes care of your diabetes knows about your planned surgery including the date and location.  How do I manage my blood sugar before surgery? Check your blood sugar at least 4 times a day, starting 2 days before surgery, to make sure that the level is not too high or low.  Check  your blood sugar the morning of your surgery when you wake up and every 2 hours until you get to the Short Stay unit.  If your blood sugar is less than 70 mg/dL, you will need to treat for low blood sugar: Do not take insulin. Treat a low blood sugar (less than 70 mg/dL) with  cup of clear juice (cranberry or apple), 4 glucose tablets, OR glucose gel. Recheck blood sugar in 15 minutes after treatment (to make sure it is greater than 70 mg/dL). If your blood sugar is not greater than 70 mg/dL on recheck, call [phone] for further instructions. Report your blood sugar to the short stay nurse when you get to Short Stay.  If you are admitted to the hospital after surgery: Your blood sugar will be checked by the staff and you will probably be given insulin after surgery (instead of oral diabetes medicines) to make sure you have good blood sugar levels. The goal for blood sugar control after surgery is 80-180 mg/dL.    As of today, STOP taking any Aspirin (unless otherwise instructed by your surgeon) Aleve, Naproxen, Ibuprofen, Motrin, Advil, Goody's, BC's, all herbal medications, fish oil, and all vitamins.  Follow your surgeon's instructions on when to stop Eliquis.  If no instructions were given by your surgeon then you will need to call the office to get those instructions.             Do not wear jewelry or makeup Do not wear lotions, powders, perfumes/colognes, or deodorant. Do not shave 48 hours prior to  surgery.   Do not bring valuables to the hospital. DO Not wear nail polish, gel polish, artificial nails, or any other type of covering on  natural nails including finger and toenails. If patients have artificial nails, gel coating, etc. that need to be removed by a nail salon please have this removed prior to surgery or surgery may need to be canceled/delayed if the surgeon/ anesthesia feels like the patient is unable to be adequately monitored.             [facility] is not  responsible for any belongings or valuables.  Do NOT Smoke (Tobacco/Vaping) or drink Alcohol 24 hours prior to your procedure If you use a CPAP at night, you may bring all equipment for your overnight stay.   Contacts, glasses, dentures or bridgework may not be worn into surgery, please bring cases for these belongings   For patients admitted to the hospital, discharge time will be determined by your treatment team.   Patients discharged the day of surgery will not be allowed to drive home, and someone needs to stay with them for 24 hours.  ONLY 1 SUPPORT PERSON MAY BE PRESENT WHILE YOU ARE IN SURGERY. IF YOU ARE TO BE ADMITTED ONCE YOU ARE IN YOUR ROOM YOU WILL BE ALLOWED TWO (2) VISITORS.  Minor children may have two parents present. Special consideration for safety and communication needs will be reviewed on a case by case basis.  Special instructions:    Oral Hygiene is also important to reduce your risk of infection.  Remember - BRUSH YOUR TEETH THE MORNING OF SURGERY WITH YOUR REGULAR TOOTHPASTE   [place]- Preparing For Surgery  Before surgery, you can play an important role. Because skin is not sterile, your skin needs to be as free of germs as possible. You can reduce the number of germs on your skin by washing with CHG (chlorahexidine gluconate) Soap before surgery.  CHG is an antiseptic cleaner which kills germs and bonds with the skin to continue killing germs even after washing.     Please do not use if you have an allergy to CHG or antibacterial soaps. If your skin becomes reddened/irritated stop using the CHG.  Do not shave (including legs and underarms) for at least 48 hours prior to first CHG shower. It is OK to shave your face.  Please follow these instructions carefully.     Shower the NIGHT BEFORE SURGERY and the MORNING OF SURGERY with CHG Soap.   If you chose to wash your hair, wash your hair first as usual with your normal shampoo. After you shampoo, rinse your  hair and body thoroughly to remove the shampoo.  Then [employer] and genitals (private parts) with your normal soap and rinse thoroughly to remove soap.  After that Use CHG Soap as you would any other liquid soap. You can apply CHG directly to the skin and wash gently with a scrungie or a clean washcloth.   Apply the CHG Soap to your body ONLY FROM THE NECK DOWN.  Do not use on open wounds or open sores. Avoid contact with your eyes, ears, mouth and genitals (private parts). Wash Face and genitals (private parts)  with your normal soap.   Wash thoroughly, paying special attention to the area where your surgery will be performed.  Thoroughly rinse your body with warm water from the neck down.  DO NOT shower/wash with your normal soap after using and rinsing off the CHG Soap.  Fraser Din  yourself dry with a CLEAN TOWEL.  Wear CLEAN PAJAMAS to bed the night before surgery  Place CLEAN SHEETS on your bed the night before your surgery  DO NOT SLEEP WITH PETS.   Day of Surgery:  Take a shower with CHG soap. Wear Clean/Comfortable clothing the morning of surgery Do not apply any deodorants/lotions.   Remember to brush your teeth WITH YOUR REGULAR TOOTHPASTE.   Please read over the following fact sheets that you were given.

## 2021-04-01 DIAGNOSIS — Z9049 Acquired absence of other specified parts of digestive tract: Secondary | ICD-10-CM | POA: Diagnosis not present

## 2021-04-01 DIAGNOSIS — I10 Essential (primary) hypertension: Secondary | ICD-10-CM | POA: Diagnosis not present

## 2021-04-01 DIAGNOSIS — Z87891 Personal history of nicotine dependence: Secondary | ICD-10-CM | POA: Diagnosis not present

## 2021-04-01 DIAGNOSIS — Z7984 Long term (current) use of oral hypoglycemic drugs: Secondary | ICD-10-CM | POA: Diagnosis not present

## 2021-04-01 DIAGNOSIS — Z808 Family history of malignant neoplasm of other organs or systems: Secondary | ICD-10-CM | POA: Diagnosis not present

## 2021-04-01 DIAGNOSIS — Z803 Family history of malignant neoplasm of breast: Secondary | ICD-10-CM | POA: Diagnosis not present

## 2021-04-01 DIAGNOSIS — Z79899 Other long term (current) drug therapy: Secondary | ICD-10-CM | POA: Diagnosis not present

## 2021-04-01 DIAGNOSIS — Z7901 Long term (current) use of anticoagulants: Secondary | ICD-10-CM | POA: Diagnosis not present

## 2021-04-01 DIAGNOSIS — R519 Headache, unspecified: Secondary | ICD-10-CM | POA: Diagnosis not present

## 2021-04-01 DIAGNOSIS — M542 Cervicalgia: Secondary | ICD-10-CM | POA: Diagnosis not present

## 2021-04-01 DIAGNOSIS — C50811 Malignant neoplasm of overlapping sites of right female breast: Secondary | ICD-10-CM | POA: Diagnosis not present

## 2021-04-01 DIAGNOSIS — E119 Type 2 diabetes mellitus without complications: Secondary | ICD-10-CM | POA: Diagnosis not present

## 2021-04-01 DIAGNOSIS — Z9221 Personal history of antineoplastic chemotherapy: Secondary | ICD-10-CM | POA: Diagnosis not present

## 2021-04-01 DIAGNOSIS — Z170 Estrogen receptor positive status [ER+]: Secondary | ICD-10-CM | POA: Diagnosis not present

## 2021-04-01 DIAGNOSIS — E785 Hyperlipidemia, unspecified: Secondary | ICD-10-CM | POA: Diagnosis not present

## 2021-04-01 DIAGNOSIS — K219 Gastro-esophageal reflux disease without esophagitis: Secondary | ICD-10-CM | POA: Diagnosis not present

## 2021-04-02 DIAGNOSIS — Z79899 Other long term (current) drug therapy: Secondary | ICD-10-CM | POA: Diagnosis not present

## 2021-04-02 DIAGNOSIS — E119 Type 2 diabetes mellitus without complications: Secondary | ICD-10-CM | POA: Diagnosis not present

## 2021-04-02 DIAGNOSIS — F32A Depression, unspecified: Secondary | ICD-10-CM | POA: Diagnosis not present

## 2021-04-02 DIAGNOSIS — R519 Headache, unspecified: Secondary | ICD-10-CM | POA: Diagnosis not present

## 2021-04-02 DIAGNOSIS — Z7984 Long term (current) use of oral hypoglycemic drugs: Secondary | ICD-10-CM | POA: Diagnosis not present

## 2021-04-02 DIAGNOSIS — I10 Essential (primary) hypertension: Secondary | ICD-10-CM | POA: Diagnosis not present

## 2021-04-02 DIAGNOSIS — Z853 Personal history of malignant neoplasm of breast: Secondary | ICD-10-CM | POA: Diagnosis not present

## 2021-04-02 DIAGNOSIS — Z803 Family history of malignant neoplasm of breast: Secondary | ICD-10-CM | POA: Diagnosis not present

## 2021-04-02 DIAGNOSIS — Z9049 Acquired absence of other specified parts of digestive tract: Secondary | ICD-10-CM | POA: Diagnosis not present

## 2021-04-02 DIAGNOSIS — C50811 Malignant neoplasm of overlapping sites of right female breast: Secondary | ICD-10-CM | POA: Diagnosis not present

## 2021-04-02 DIAGNOSIS — Z808 Family history of malignant neoplasm of other organs or systems: Secondary | ICD-10-CM | POA: Diagnosis not present

## 2021-04-02 DIAGNOSIS — Z7901 Long term (current) use of anticoagulants: Secondary | ICD-10-CM | POA: Diagnosis not present

## 2021-04-02 DIAGNOSIS — M542 Cervicalgia: Secondary | ICD-10-CM | POA: Diagnosis not present

## 2021-04-02 DIAGNOSIS — Z9221 Personal history of antineoplastic chemotherapy: Secondary | ICD-10-CM | POA: Diagnosis not present

## 2021-04-02 DIAGNOSIS — K219 Gastro-esophageal reflux disease without esophagitis: Secondary | ICD-10-CM | POA: Diagnosis not present

## 2021-04-02 DIAGNOSIS — R202 Paresthesia of skin: Secondary | ICD-10-CM | POA: Diagnosis not present

## 2021-04-02 DIAGNOSIS — Z87891 Personal history of nicotine dependence: Secondary | ICD-10-CM | POA: Diagnosis not present

## 2021-04-02 DIAGNOSIS — E785 Hyperlipidemia, unspecified: Secondary | ICD-10-CM | POA: Diagnosis not present

## 2021-04-07 ENCOUNTER — Ambulatory Visit (HOSPITAL_COMMUNITY): Payer: Medicare Other | Admitting: Anesthesiology

## 2021-04-07 ENCOUNTER — Encounter (HOSPITAL_COMMUNITY)
Admission: RE | Disposition: A | Discharge status: Home / Self Care | Payer: Self-pay | Source: Home / Self Care | Attending: Surgery

## 2021-04-07 ENCOUNTER — Ambulatory Visit (HOSPITAL_COMMUNITY)
Admission: RE | Admit: 2021-04-07 | Discharge: 2021-04-07 | Disposition: A | Discharge status: Home / Self Care | Payer: Medicare Other | Attending: Surgery | Admitting: Surgery

## 2021-04-07 DIAGNOSIS — Z79899 Other long term (current) drug therapy: Secondary | ICD-10-CM | POA: Diagnosis not present

## 2021-04-07 DIAGNOSIS — Z452 Encounter for adjustment and management of vascular access device: Secondary | ICD-10-CM | POA: Insufficient documentation

## 2021-04-07 DIAGNOSIS — C50911 Malignant neoplasm of unspecified site of right female breast: Secondary | ICD-10-CM | POA: Diagnosis not present

## 2021-04-07 DIAGNOSIS — E785 Hyperlipidemia, unspecified: Secondary | ICD-10-CM | POA: Diagnosis not present

## 2021-04-07 DIAGNOSIS — Z7984 Long term (current) use of oral hypoglycemic drugs: Secondary | ICD-10-CM | POA: Insufficient documentation

## 2021-04-07 DIAGNOSIS — G43909 Migraine, unspecified, not intractable, without status migrainosus: Secondary | ICD-10-CM | POA: Diagnosis not present

## 2021-04-07 DIAGNOSIS — Z9221 Personal history of antineoplastic chemotherapy: Secondary | ICD-10-CM | POA: Insufficient documentation

## 2021-04-07 DIAGNOSIS — Z9119 Patient's noncompliance with other medical treatment and regimen: Secondary | ICD-10-CM | POA: Insufficient documentation

## 2021-04-07 DIAGNOSIS — Z79891 Long term (current) use of opiate analgesic: Secondary | ICD-10-CM | POA: Insufficient documentation

## 2021-04-07 DIAGNOSIS — Z7901 Long term (current) use of anticoagulants: Secondary | ICD-10-CM | POA: Insufficient documentation

## 2021-04-07 DIAGNOSIS — Z87891 Personal history of nicotine dependence: Secondary | ICD-10-CM | POA: Insufficient documentation

## 2021-04-07 HISTORY — PX: PORT-A-CATH REMOVAL: SHX5289

## 2021-04-07 LAB — GLUCOSE, CAPILLARY
Glucose-Capillary: 122 mg/dL — ABNORMAL HIGH (ref 70–99)
Glucose-Capillary: 127 mg/dL — ABNORMAL HIGH (ref 70–99)

## 2021-04-07 SURGERY — REMOVAL PORT-A-CATH
Anesthesia: General | Site: Chest | Laterality: Right

## 2021-04-07 MED ORDER — BUPIVACAINE-EPINEPHRINE 0.25% -1:200000 IJ SOLN
INTRAMUSCULAR | Status: DC | PRN
  Administered 2021-04-07: 8 mL

## 2021-04-07 MED ORDER — ONDANSETRON HCL 4 MG/2ML IJ SOLN
INTRAMUSCULAR | Status: DC | PRN
  Administered 2021-04-07: 4 mg via INTRAVENOUS

## 2021-04-07 MED ORDER — DEXAMETHASONE SODIUM PHOSPHATE 10 MG/ML IJ SOLN
INTRAMUSCULAR | Status: DC | PRN
  Administered 2021-04-07: 10 mg via INTRAVENOUS

## 2021-04-07 MED ORDER — BUPIVACAINE-EPINEPHRINE (PF) 0.25% -1:200000 IJ SOLN
INTRAMUSCULAR | Status: AC
  Filled 2021-04-07: qty 30

## 2021-04-07 MED ORDER — CEFAZOLIN SODIUM-DEXTROSE 2-4 GM/100ML-% IV SOLN
2.0000 g | INTRAVENOUS | Status: AC
  Administered 2021-04-07: 2 g via INTRAVENOUS

## 2021-04-07 MED ORDER — ORAL CARE MOUTH RINSE: 15.0000 mL | Freq: Once | OROMUCOSAL | Status: AC

## 2021-04-07 MED ORDER — CHLORHEXIDINE GLUCONATE CLOTH 2 % EX PADS: 6.0000 | MEDICATED_PAD | Freq: Once | CUTANEOUS | Status: DC

## 2021-04-07 MED ORDER — LACTATED RINGERS IV SOLN: INTRAVENOUS | Status: DC

## 2021-04-07 MED ORDER — IBUPROFEN 800 MG PO TABS: 800.0000 mg | ORAL_TABLET | Freq: Three times a day (TID) | ORAL | 0 refills | Status: AC | PRN

## 2021-04-07 MED ORDER — FENTANYL CITRATE (PF) 100 MCG/2ML IJ SOLN
INTRAMUSCULAR | Status: DC | PRN
  Administered 2021-04-07: 50 ug via INTRAVENOUS

## 2021-04-07 MED ORDER — FENTANYL CITRATE (PF) 250 MCG/5ML IJ SOLN
INTRAMUSCULAR | Status: AC
  Filled 2021-04-07: qty 5

## 2021-04-07 MED ORDER — CEFAZOLIN IN SODIUM CHLORIDE 3-0.9 GM/100ML-% IV SOLN: 3.0000 g | INTRAVENOUS | Status: DC

## 2021-04-07 MED ORDER — PROPOFOL 10 MG/ML IV BOLUS
INTRAVENOUS | Status: DC | PRN
  Administered 2021-04-07: 200 mg via INTRAVENOUS

## 2021-04-07 MED ORDER — CHLORHEXIDINE GLUCONATE 0.12 % MT SOLN
OROMUCOSAL | Status: AC
  Administered 2021-04-07: 15 mL via OROMUCOSAL
  Filled 2021-04-07: qty 15

## 2021-04-07 MED ORDER — CEFAZOLIN IN SODIUM CHLORIDE 3-0.9 GM/100ML-% IV SOLN
INTRAVENOUS | Status: AC
  Filled 2021-04-07: qty 100

## 2021-04-07 MED ORDER — CHLORHEXIDINE GLUCONATE 0.12 % MT SOLN
15.0000 mL | Freq: Once | OROMUCOSAL | Status: AC
Start: 2021-04-07 — End: 2021-04-07

## 2021-04-07 MED ORDER — PROPOFOL 10 MG/ML IV BOLUS
INTRAVENOUS | Status: AC
  Filled 2021-04-07: qty 20

## 2021-04-07 MED ORDER — PHENYLEPHRINE 40 MCG/ML (10ML) SYRINGE FOR IV PUSH (FOR BLOOD PRESSURE SUPPORT)
PREFILLED_SYRINGE | INTRAVENOUS | Status: DC | PRN
  Administered 2021-04-07 (×2): 40 ug via INTRAVENOUS
  Administered 2021-04-07: 80 ug via INTRAVENOUS

## 2021-04-07 MED ORDER — CEFAZOLIN SODIUM-DEXTROSE 2-4 GM/100ML-% IV SOLN
INTRAVENOUS | Status: AC
  Filled 2021-04-07: qty 100

## 2021-04-07 MED ORDER — LIDOCAINE 2% (20 MG/ML) 5 ML SYRINGE
INTRAMUSCULAR | Status: DC | PRN
  Administered 2021-04-07: 60 mg via INTRAVENOUS

## 2021-04-07 SURGICAL SUPPLY — 30 items
CHLORAPREP W/TINT 10.5 ML (MISCELLANEOUS) ×2 IMPLANT
COVER SURGICAL LIGHT HANDLE (MISCELLANEOUS) ×2 IMPLANT
COVER WAND RF STERILE (DRAPES) ×2 IMPLANT
DECANTER SPIKE VIAL GLASS SM (MISCELLANEOUS) ×2 IMPLANT
DERMABOND ADVANCED (GAUZE/BANDAGES/DRESSINGS) ×1
DERMABOND ADVANCED .7 DNX12 (GAUZE/BANDAGES/DRESSINGS) ×1 IMPLANT
DRAPE LAPAROTOMY 100X72 PEDS (DRAPES) ×2 IMPLANT
ELECT REM PT RETURN 9FT ADLT (ELECTROSURGICAL) ×2
ELECTRODE REM PT RTRN 9FT ADLT (ELECTROSURGICAL) ×1 IMPLANT
GAUZE 4X4 16PLY RFD (DISPOSABLE) ×2 IMPLANT
GLOVE SRG 8 PF TXTR STRL LF DI (GLOVE) ×1 IMPLANT
GLOVE SURG ENC MOIS LTX SZ8 (GLOVE) ×2 IMPLANT
GLOVE SURG UNDER POLY LF SZ8 (GLOVE) ×2
GOWN STRL REUS W/ TWL LRG LVL3 (GOWN DISPOSABLE) ×1 IMPLANT
GOWN STRL REUS W/ TWL XL LVL3 (GOWN DISPOSABLE) ×1 IMPLANT
GOWN STRL REUS W/TWL LRG LVL3 (GOWN DISPOSABLE) ×2
GOWN STRL REUS W/TWL XL LVL3 (GOWN DISPOSABLE) ×2
KIT BASIN OR (CUSTOM PROCEDURE TRAY) ×2 IMPLANT
KIT TURNOVER KIT B (KITS) ×2 IMPLANT
NEEDLE HYPO 25GX1X1/2 BEV (NEEDLE) ×2 IMPLANT
NS IRRIG 1000ML POUR BTL (IV SOLUTION) ×2 IMPLANT
PACK GENERAL/GYN (CUSTOM PROCEDURE TRAY) ×2 IMPLANT
PAD ARMBOARD 7.5X6 YLW CONV (MISCELLANEOUS) ×4 IMPLANT
PENCIL SMOKE EVACUATOR (MISCELLANEOUS) ×2 IMPLANT
SUT MNCRL AB 4-0 PS2 18 (SUTURE) ×2 IMPLANT
SUT VIC AB 3-0 SH 27 (SUTURE) ×2
SUT VIC AB 3-0 SH 27X BRD (SUTURE) ×1 IMPLANT
SYR CONTROL 10ML LL (SYRINGE) ×2 IMPLANT
TOWEL GREEN STERILE (TOWEL DISPOSABLE) ×2 IMPLANT
TOWEL GREEN STERILE FF (TOWEL DISPOSABLE) ×2 IMPLANT

## 2021-04-07 NOTE — Transfer of Care (Signed)
Immediate Anesthesia Transfer of Care Note  Patient: Beth Phelps [facility]  Procedure(s) Performed: REMOVAL PORT-A-CATH (Right: Chest)  Patient Location: PACU  Anesthesia Type:General  Level of Consciousness: drowsy  Airway & Oxygen Therapy: Patient Spontanous Breathing and Patient connected to face mask oxygen  Post-op Assessment: Report given to RN and Post -op Vital signs reviewed and stable  Post vital signs: Reviewed and stable  Last Vitals:  Vitals Value Taken Time  BP 136/99 04/07/21 1350  Temp    Pulse 64 04/07/21 1352  Resp 18 04/07/21 1352  SpO2 100 % 04/07/21 1352  Vitals shown include unvalidated device data.  Last Pain:  Vitals:   04/07/21 1211  TempSrc:   PainSc: 0-No pain         Complications: No notable events documented.

## 2021-04-07 NOTE — Discharge Instructions (Signed)
GENERAL SURGERY: POST OP INSTRUCTIONS  ######################################################################  EAT Gradually transition to a high fiber diet with a fiber supplement over the next few weeks after discharge.  Start with a pureed / full liquid diet (see below)  WALK Walk an hour a day.  Control your pain to do that.    CONTROL PAIN Control pain so that you can walk, sleep, tolerate sneezing/coughing, go up/down stairs.  HAVE A BOWEL MOVEMENT DAILY Keep your bowels regular to avoid problems.  OK to try a laxative to override constipation.  OK to use an antidairrheal to slow down diarrhea.  Call if not better after 2 tries  CALL IF YOU HAVE PROBLEMS/CONCERNS Call if you are still struggling despite following these instructions. Call if you have concerns not answered by these instructions  ######################################################################    DIET: Follow a light bland diet & liquids the first 24 hours after arrival home, such as soup, liquids, starches, etc.  Be sure to drink plenty of fluids.  Quickly advance to a usual solid diet within a few days.  Avoid fast food or heavy meals as your are more likely to get nauseated or have irregular bowels.  A low-fat, high-fiber diet for the rest of your life is ideal.   Take your usually prescribed home medications unless otherwise directed. PAIN CONTROL: Pain is best controlled by a usual combination of three different methods TOGETHER: Ice/Heat Over the counter pain medication Prescription pain medication Most patients will experience some swelling and bruising around the incisions.  Ice packs or heating pads (30-60 minutes up to 6 times a day) will help. Use ice for the first few days to help decrease swelling and bruising, then switch to heat to help relax tight/sore spots and speed recovery.  Some people prefer to use ice alone, heat alone, alternating between ice & heat.  Experiment to what works for you.   Swelling and bruising can take several weeks to resolve.   It is helpful to take an over-the-counter pain medication regularly for the first few weeks.  Choose one of the following that works best for you: Naproxen (Aleve, etc)  Two 220mg tabs twice a day Ibuprofen (Advil, etc) Three 200mg tabs four times a day (every meal & bedtime) Acetaminophen (Tylenol, etc) 500-650mg four times a day (every meal & bedtime) A  prescription for pain medication (such as oxycodone, hydrocodone, etc) should be given to you upon discharge.  Take your pain medication as prescribed.  If you are having problems/concerns with the prescription medicine (does not control pain, nausea, vomiting, rash, itching, etc), please call us [phone] to see if we need to switch you to a different pain medicine that will work better for you and/or control your side effect better. If you need a refill on your pain medication, please contact your pharmacy.  They will contact our office to request authorization. Prescriptions will not be filled after 5 pm or on week-ends. Avoid getting constipated.  Between the surgery and the pain medications, it is common to experience some constipation.  Increasing fluid intake and taking a fiber supplement (such as Metamucil, Citrucel, FiberCon, MiraLax, etc) 1-2 times a day regularly will usually help prevent this problem from occurring.  A mild laxative (prune juice, Milk of Magnesia, MiraLax, etc) should be taken according to package directions if there are no bowel movements after 48 hours.   Wash / shower every day.  You may shower over the dressings as they are waterproof.  Continue to   shower over incision(s) after the dressing is off. Remove your waterproof bandages 5 days after surgery.  You may leave the incision open to air.  You may have skin tapes (Steri Strips) covering the incision(s).  Leave them on until one week, then remove.  You may replace a dressing/Band-Aid to cover the incision  for comfort if you wish.      ACTIVITIES as tolerated:   You may resume regular (light) daily activities beginning the next day--such as daily self-care, walking, climbing stairs--gradually increasing activities as tolerated.  If you can walk 30 minutes without difficulty, it is safe to try more intense activity such as jogging, treadmill, bicycling, low-impact aerobics, swimming, etc. Save the most intensive and strenuous activity for last such as sit-ups, heavy lifting, contact sports, etc  Refrain from any heavy lifting or straining until you are off narcotics for pain control.   DO NOT PUSH THROUGH PAIN.  Let pain be your guide: If it hurts to do something, don't do it.  Pain is your body warning you to avoid that activity for another week until the pain goes down. You may drive when you are no longer taking prescription pain medication, you can comfortably wear a seatbelt, and you can safely maneuver your car and apply brakes. You may have sexual intercourse when it is comfortable.  FOLLOW UP in our office Please call [facility] at [phone] to set up an appointment to see your surgeon in the office for a follow-up appointment approximately 2-3 weeks after your surgery. Make sure that you call for this appointment the day you arrive home to insure a convenient appointment time. 9. IF YOU HAVE DISABILITY OR FAMILY LEAVE FORMS, BRING THEM TO THE OFFICE FOR PROCESSING.  DO NOT GIVE THEM TO YOUR DOCTOR.   WHEN TO CALL us [phone]: Poor pain control Reactions / problems with new medications (rash/itching, nausea, etc)  Fever over 101.5 F (38.5 C) Worsening swelling or bruising Continued bleeding from incision. Increased pain, redness, or drainage from the incision Difficulty breathing / swallowing   The clinic staff is available to answer your questions during regular business hours (8:30am-5pm).  Please don't hesitate to call and ask to speak to one of our nurses for clinical concerns.    If you have a medical emergency, go to the nearest emergency room or call 911.  A surgeon from [facility] is always on call at the [facility], [place], [place], [place], [place]  [address] ? MAIN: [phone] ? TOLL FREE: [phone] ?  FAX [phone] [email or web address]

## 2021-04-07 NOTE — Anesthesia Procedure Notes (Signed)
Procedure Name: LMA Insertion Date/Time: 04/07/2021 1:20 PM Performed by: Ezequiel Kayser, CRNA Pre-anesthesia Checklist: Patient identified, Emergency Drugs available, Suction available and Patient being monitored Patient Re-evaluated:Patient Re-evaluated prior to induction Oxygen Delivery Method: Circle System Utilized Preoxygenation: Pre-oxygenation with 100% oxygen Induction Type: IV induction Ventilation: Mask ventilation without difficulty LMA: LMA inserted LMA Size: 4.0 Number of attempts: 1 Airway Equipment and Method: Bite block Placement Confirmation: positive ETCO2 Tube secured with: Tape Dental Injury: Teeth and Oropharynx as per pre-operative assessment

## 2021-04-07 NOTE — Op Note (Signed)
Preop diagnosis: Indwelling port a catheter for chemotherapy  Postop diagnosis: Same  Procedure: Removal of port a catheter  Surgeon: Bralon Antkowiak M.D.  Anesthesia: MAC with local  EBL: Minimal  Specimen none  Drains: None  Indications for procedure: The patient presents for removal of port a catheter after completing chemotherapy. The patient no longer requires central venous access. Risks of bleeding, infection, catheter fragmentation, embolization, arrhythmias and damage to arteries, veins and nerves and possibly other mediastinal structures discussed. The patient agrees to proceed.  Description of procedure: The patient was seen in the holding area. Questions were answered. The patient agreed to proceed. The patient was taken to the operating room. The patient was placed supine. Anesthesia was initiated. The skin on the upper chest was prepped and draped in a sterile fashion. Timeout was done. The patient received preoperative antibiotics. Incision was made through the old port site and the hub of the Port-A-Cath was seen. The sutures were cut to release the port from the chest wall. The catheter was removed in its entirety without difficulty. The tract was closed with 3-0 Vicryl. 4 Monocryl was used to close the skin. All final counts were correct. The patient was taken to recovery in satisfactory condition.  

## 2021-04-07 NOTE — Anesthesia Postprocedure Evaluation (Signed)
Anesthesia Post Note  Patient: Beth [facility]ass [facility]  Procedure(s) Performed: REMOVAL PORT-A-CATH (Right: Chest)     Patient location during evaluation: PACU Anesthesia Type: General Level of consciousness: awake and alert, oriented and patient cooperative Pain management: pain level controlled Vital Signs Assessment: post-procedure vital signs reviewed and stable Respiratory status: spontaneous breathing, nonlabored ventilation and respiratory function stable Cardiovascular status: blood pressure returned to baseline and stable Postop Assessment: no apparent nausea or vomiting Anesthetic complications: no   No notable events documented.  Last Vitals:  Vitals:   04/07/21 1350 04/07/21 1405  BP: (!) 136/99 134/68  Pulse: 62 60  Resp: 18 18  Temp: 36.4 C 36.4 C  SpO2: 100% 97%    Last Pain:  Vitals:   04/07/21 1405  TempSrc:   PainSc: 0-No pain                 Pervis Hocking

## 2021-04-07 NOTE — Interval H&P Note (Signed)
History and Physical Interval Note:  04/07/2021 11:21 AM  Beth Phelps  has presented today for surgery, with the diagnosis of RIGHT BREAST CANCER.  The various methods of treatment have been discussed with the patient and family. After consideration of risks, benefits and other options for treatment, the patient has consented to  Procedure(s): REMOVAL PORT-A-CATH (N/A) as a surgical intervention.  The patient's history has been reviewed, patient examined, no change in status, stable for surgery.  I have reviewed the patient's chart and labs.  Questions were answered to the patient's satisfaction.     Turner Daniels MD

## 2021-04-07 NOTE — Anesthesia Preprocedure Evaluation (Addendum)
Anesthesia Evaluation  Patient identified by MRN, date of birth, ID band Patient awake    Reviewed: Allergy & Precautions, NPO status , Patient's Chart, lab work & pertinent test results  Airway Mallampati: III  TM Distance: >3 FB     Dental  (+) Dental Advisory Given, Missing,    Pulmonary shortness of breath and with exertion, sleep apnea (noncompliant with CPAP) , former smoker,  Albuterol- last use a month ago    breath sounds clear to auscultation       Cardiovascular hypertension, Pt. on medications and Pt. on home beta blockers  Rhythm:Regular Rate:Normal     Neuro/Psych  Headaches, PSYCHIATRIC DISORDERS Anxiety    GI/Hepatic Neg liver ROS, hiatal hernia, GERD  Controlled and Medicated,  Endo/Other  diabetes, Well Controlled, Type 2, Oral Hypoglycemic AgentsFS 136 in preop   Renal/GU Renal disease  negative genitourinary   Musculoskeletal negative musculoskeletal ROS (+)   Abdominal (+) + obese,   Peds  Hematology negative hematology ROS (+)   Anesthesia Other Findings eliquis LD 4d ago  Reproductive/Obstetrics negative OB ROS                            Anesthesia Physical  Anesthesia Plan  ASA: 4  Anesthesia Plan: General   Post-op Pain Management:    Induction: Intravenous  PONV Risk Score and Plan: 3 and Ondansetron, Dexamethasone and Treatment may vary due to age or medical condition  Airway Management Planned: LMA  Additional Equipment: None  Intra-op Plan:   Post-operative Plan: Extubation in OR  Informed Consent: I have reviewed the patients History and Physical, chart, labs and discussed the procedure including the risks, benefits and alternatives for the proposed anesthesia with the patient or authorized representative who has indicated his/her understanding and acceptance.     Dental advisory given  Plan Discussed with: CRNA and  Anesthesiologist  Anesthesia Plan Comments:        Anesthesia Quick Evaluation

## 2021-04-08 ENCOUNTER — Encounter (HOSPITAL_COMMUNITY): Payer: Self-pay | Admitting: Surgery

## 2021-04-12 DIAGNOSIS — E119 Type 2 diabetes mellitus without complications: Secondary | ICD-10-CM | POA: Diagnosis not present

## 2021-04-12 DIAGNOSIS — Z87891 Personal history of nicotine dependence: Secondary | ICD-10-CM | POA: Diagnosis not present

## 2021-04-12 DIAGNOSIS — Z9049 Acquired absence of other specified parts of digestive tract: Secondary | ICD-10-CM | POA: Diagnosis not present

## 2021-04-12 DIAGNOSIS — M542 Cervicalgia: Secondary | ICD-10-CM | POA: Diagnosis not present

## 2021-04-12 DIAGNOSIS — K219 Gastro-esophageal reflux disease without esophagitis: Secondary | ICD-10-CM | POA: Diagnosis not present

## 2021-04-12 DIAGNOSIS — Z808 Family history of malignant neoplasm of other organs or systems: Secondary | ICD-10-CM | POA: Diagnosis not present

## 2021-04-12 DIAGNOSIS — E785 Hyperlipidemia, unspecified: Secondary | ICD-10-CM | POA: Diagnosis not present

## 2021-04-12 DIAGNOSIS — I10 Essential (primary) hypertension: Secondary | ICD-10-CM | POA: Diagnosis not present

## 2021-04-12 DIAGNOSIS — R519 Headache, unspecified: Secondary | ICD-10-CM | POA: Diagnosis not present

## 2021-04-12 DIAGNOSIS — C50811 Malignant neoplasm of overlapping sites of right female breast: Secondary | ICD-10-CM | POA: Diagnosis not present

## 2021-04-12 DIAGNOSIS — Z7984 Long term (current) use of oral hypoglycemic drugs: Secondary | ICD-10-CM | POA: Diagnosis not present

## 2021-04-12 DIAGNOSIS — Z9221 Personal history of antineoplastic chemotherapy: Secondary | ICD-10-CM | POA: Diagnosis not present

## 2021-04-12 DIAGNOSIS — Z803 Family history of malignant neoplasm of breast: Secondary | ICD-10-CM | POA: Diagnosis not present

## 2021-04-12 DIAGNOSIS — Z7901 Long term (current) use of anticoagulants: Secondary | ICD-10-CM | POA: Diagnosis not present

## 2021-04-12 DIAGNOSIS — Z79899 Other long term (current) drug therapy: Secondary | ICD-10-CM | POA: Diagnosis not present

## 2021-04-14 DIAGNOSIS — C50811 Malignant neoplasm of overlapping sites of right female breast: Secondary | ICD-10-CM | POA: Diagnosis not present

## 2021-04-14 DIAGNOSIS — Z7901 Long term (current) use of anticoagulants: Secondary | ICD-10-CM | POA: Diagnosis not present

## 2021-04-14 DIAGNOSIS — Z7984 Long term (current) use of oral hypoglycemic drugs: Secondary | ICD-10-CM | POA: Diagnosis not present

## 2021-04-14 DIAGNOSIS — K219 Gastro-esophageal reflux disease without esophagitis: Secondary | ICD-10-CM | POA: Diagnosis not present

## 2021-04-14 DIAGNOSIS — M542 Cervicalgia: Secondary | ICD-10-CM | POA: Diagnosis not present

## 2021-04-14 DIAGNOSIS — Z9049 Acquired absence of other specified parts of digestive tract: Secondary | ICD-10-CM | POA: Diagnosis not present

## 2021-04-14 DIAGNOSIS — R519 Headache, unspecified: Secondary | ICD-10-CM | POA: Diagnosis not present

## 2021-04-14 DIAGNOSIS — E785 Hyperlipidemia, unspecified: Secondary | ICD-10-CM | POA: Diagnosis not present

## 2021-04-14 DIAGNOSIS — Z9221 Personal history of antineoplastic chemotherapy: Secondary | ICD-10-CM | POA: Diagnosis not present

## 2021-04-14 DIAGNOSIS — E119 Type 2 diabetes mellitus without complications: Secondary | ICD-10-CM | POA: Diagnosis not present

## 2021-04-14 DIAGNOSIS — Z79899 Other long term (current) drug therapy: Secondary | ICD-10-CM | POA: Diagnosis not present

## 2021-04-14 DIAGNOSIS — I10 Essential (primary) hypertension: Secondary | ICD-10-CM | POA: Diagnosis not present

## 2021-04-14 DIAGNOSIS — Z803 Family history of malignant neoplasm of breast: Secondary | ICD-10-CM | POA: Diagnosis not present

## 2021-04-14 DIAGNOSIS — Z87891 Personal history of nicotine dependence: Secondary | ICD-10-CM | POA: Diagnosis not present

## 2021-04-14 DIAGNOSIS — Z808 Family history of malignant neoplasm of other organs or systems: Secondary | ICD-10-CM | POA: Diagnosis not present

## 2021-04-15 DIAGNOSIS — Z808 Family history of malignant neoplasm of other organs or systems: Secondary | ICD-10-CM | POA: Diagnosis not present

## 2021-04-15 DIAGNOSIS — I10 Essential (primary) hypertension: Secondary | ICD-10-CM | POA: Diagnosis not present

## 2021-04-15 DIAGNOSIS — E1165 Type 2 diabetes mellitus with hyperglycemia: Secondary | ICD-10-CM | POA: Diagnosis not present

## 2021-04-15 DIAGNOSIS — C50811 Malignant neoplasm of overlapping sites of right female breast: Secondary | ICD-10-CM | POA: Diagnosis not present

## 2021-04-15 DIAGNOSIS — E1122 Type 2 diabetes mellitus with diabetic chronic kidney disease: Secondary | ICD-10-CM | POA: Diagnosis not present

## 2021-04-15 DIAGNOSIS — R519 Headache, unspecified: Secondary | ICD-10-CM | POA: Diagnosis not present

## 2021-04-15 DIAGNOSIS — Z87891 Personal history of nicotine dependence: Secondary | ICD-10-CM | POA: Diagnosis not present

## 2021-04-15 DIAGNOSIS — E785 Hyperlipidemia, unspecified: Secondary | ICD-10-CM | POA: Diagnosis not present

## 2021-04-15 DIAGNOSIS — Z7901 Long term (current) use of anticoagulants: Secondary | ICD-10-CM | POA: Diagnosis not present

## 2021-04-15 DIAGNOSIS — Z9049 Acquired absence of other specified parts of digestive tract: Secondary | ICD-10-CM | POA: Diagnosis not present

## 2021-04-15 DIAGNOSIS — Z803 Family history of malignant neoplasm of breast: Secondary | ICD-10-CM | POA: Diagnosis not present

## 2021-04-15 DIAGNOSIS — N183 Chronic kidney disease, stage 3 unspecified: Secondary | ICD-10-CM | POA: Diagnosis not present

## 2021-04-15 DIAGNOSIS — M542 Cervicalgia: Secondary | ICD-10-CM | POA: Diagnosis not present

## 2021-04-15 DIAGNOSIS — Z9221 Personal history of antineoplastic chemotherapy: Secondary | ICD-10-CM | POA: Diagnosis not present

## 2021-04-15 DIAGNOSIS — K219 Gastro-esophageal reflux disease without esophagitis: Secondary | ICD-10-CM | POA: Diagnosis not present

## 2021-04-15 DIAGNOSIS — Z7984 Long term (current) use of oral hypoglycemic drugs: Secondary | ICD-10-CM | POA: Diagnosis not present

## 2021-04-15 DIAGNOSIS — Z79899 Other long term (current) drug therapy: Secondary | ICD-10-CM | POA: Diagnosis not present

## 2021-04-15 DIAGNOSIS — E119 Type 2 diabetes mellitus without complications: Secondary | ICD-10-CM | POA: Diagnosis not present

## 2021-04-15 DIAGNOSIS — I129 Hypertensive chronic kidney disease with stage 1 through stage 4 chronic kidney disease, or unspecified chronic kidney disease: Secondary | ICD-10-CM | POA: Diagnosis not present

## 2021-04-16 DIAGNOSIS — Z7901 Long term (current) use of anticoagulants: Secondary | ICD-10-CM | POA: Diagnosis not present

## 2021-04-16 DIAGNOSIS — Z9049 Acquired absence of other specified parts of digestive tract: Secondary | ICD-10-CM | POA: Diagnosis not present

## 2021-04-16 DIAGNOSIS — Z9221 Personal history of antineoplastic chemotherapy: Secondary | ICD-10-CM | POA: Diagnosis not present

## 2021-04-16 DIAGNOSIS — E785 Hyperlipidemia, unspecified: Secondary | ICD-10-CM | POA: Diagnosis not present

## 2021-04-16 DIAGNOSIS — Z87891 Personal history of nicotine dependence: Secondary | ICD-10-CM | POA: Diagnosis not present

## 2021-04-16 DIAGNOSIS — Z7984 Long term (current) use of oral hypoglycemic drugs: Secondary | ICD-10-CM | POA: Diagnosis not present

## 2021-04-16 DIAGNOSIS — M542 Cervicalgia: Secondary | ICD-10-CM | POA: Diagnosis not present

## 2021-04-16 DIAGNOSIS — Z170 Estrogen receptor positive status [ER+]: Secondary | ICD-10-CM | POA: Diagnosis not present

## 2021-04-16 DIAGNOSIS — C50811 Malignant neoplasm of overlapping sites of right female breast: Secondary | ICD-10-CM | POA: Diagnosis not present

## 2021-04-16 DIAGNOSIS — Z808 Family history of malignant neoplasm of other organs or systems: Secondary | ICD-10-CM | POA: Diagnosis not present

## 2021-04-16 DIAGNOSIS — K219 Gastro-esophageal reflux disease without esophagitis: Secondary | ICD-10-CM | POA: Diagnosis not present

## 2021-04-16 DIAGNOSIS — Z803 Family history of malignant neoplasm of breast: Secondary | ICD-10-CM | POA: Diagnosis not present

## 2021-04-16 DIAGNOSIS — I10 Essential (primary) hypertension: Secondary | ICD-10-CM | POA: Diagnosis not present

## 2021-04-16 DIAGNOSIS — R519 Headache, unspecified: Secondary | ICD-10-CM | POA: Diagnosis not present

## 2021-04-16 DIAGNOSIS — E119 Type 2 diabetes mellitus without complications: Secondary | ICD-10-CM | POA: Diagnosis not present

## 2021-04-16 DIAGNOSIS — Z79899 Other long term (current) drug therapy: Secondary | ICD-10-CM | POA: Diagnosis not present

## 2021-04-18 DIAGNOSIS — Z7984 Long term (current) use of oral hypoglycemic drugs: Secondary | ICD-10-CM | POA: Diagnosis not present

## 2021-04-18 DIAGNOSIS — I129 Hypertensive chronic kidney disease with stage 1 through stage 4 chronic kidney disease, or unspecified chronic kidney disease: Secondary | ICD-10-CM | POA: Diagnosis not present

## 2021-04-18 DIAGNOSIS — G43C0 Periodic headache syndromes in child or adult, not intractable: Secondary | ICD-10-CM | POA: Diagnosis not present

## 2021-04-18 DIAGNOSIS — E1122 Type 2 diabetes mellitus with diabetic chronic kidney disease: Secondary | ICD-10-CM | POA: Diagnosis not present

## 2021-04-18 DIAGNOSIS — N183 Chronic kidney disease, stage 3 unspecified: Secondary | ICD-10-CM | POA: Diagnosis not present

## 2021-04-18 DIAGNOSIS — E785 Hyperlipidemia, unspecified: Secondary | ICD-10-CM | POA: Diagnosis not present

## 2021-04-18 DIAGNOSIS — G4489 Other headache syndrome: Secondary | ICD-10-CM | POA: Diagnosis not present

## 2021-04-18 DIAGNOSIS — Z79899 Other long term (current) drug therapy: Secondary | ICD-10-CM | POA: Diagnosis not present

## 2021-04-20 DIAGNOSIS — I10 Essential (primary) hypertension: Secondary | ICD-10-CM | POA: Diagnosis not present

## 2021-04-20 DIAGNOSIS — C50811 Malignant neoplasm of overlapping sites of right female breast: Secondary | ICD-10-CM | POA: Diagnosis not present

## 2021-04-20 DIAGNOSIS — E785 Hyperlipidemia, unspecified: Secondary | ICD-10-CM | POA: Diagnosis not present

## 2021-04-20 DIAGNOSIS — R519 Headache, unspecified: Secondary | ICD-10-CM | POA: Diagnosis not present

## 2021-04-20 DIAGNOSIS — Z7984 Long term (current) use of oral hypoglycemic drugs: Secondary | ICD-10-CM | POA: Diagnosis not present

## 2021-04-20 DIAGNOSIS — Z87891 Personal history of nicotine dependence: Secondary | ICD-10-CM | POA: Diagnosis not present

## 2021-04-20 DIAGNOSIS — Z7901 Long term (current) use of anticoagulants: Secondary | ICD-10-CM | POA: Diagnosis not present

## 2021-04-20 DIAGNOSIS — Z9049 Acquired absence of other specified parts of digestive tract: Secondary | ICD-10-CM | POA: Diagnosis not present

## 2021-04-20 DIAGNOSIS — Z803 Family history of malignant neoplasm of breast: Secondary | ICD-10-CM | POA: Diagnosis not present

## 2021-04-20 DIAGNOSIS — Z170 Estrogen receptor positive status [ER+]: Secondary | ICD-10-CM | POA: Diagnosis not present

## 2021-04-20 DIAGNOSIS — K219 Gastro-esophageal reflux disease without esophagitis: Secondary | ICD-10-CM | POA: Diagnosis not present

## 2021-04-20 DIAGNOSIS — Z808 Family history of malignant neoplasm of other organs or systems: Secondary | ICD-10-CM | POA: Diagnosis not present

## 2021-04-20 DIAGNOSIS — M542 Cervicalgia: Secondary | ICD-10-CM | POA: Diagnosis not present

## 2021-04-20 DIAGNOSIS — E119 Type 2 diabetes mellitus without complications: Secondary | ICD-10-CM | POA: Diagnosis not present

## 2021-04-20 DIAGNOSIS — Z515 Encounter for palliative care: Secondary | ICD-10-CM | POA: Diagnosis not present

## 2021-04-20 DIAGNOSIS — Z79899 Other long term (current) drug therapy: Secondary | ICD-10-CM | POA: Diagnosis not present

## 2021-04-20 DIAGNOSIS — Z9221 Personal history of antineoplastic chemotherapy: Secondary | ICD-10-CM | POA: Diagnosis not present

## 2021-04-21 DIAGNOSIS — Z79899 Other long term (current) drug therapy: Secondary | ICD-10-CM | POA: Diagnosis not present

## 2021-04-21 DIAGNOSIS — Z803 Family history of malignant neoplasm of breast: Secondary | ICD-10-CM | POA: Diagnosis not present

## 2021-04-21 DIAGNOSIS — G44221 Chronic tension-type headache, intractable: Secondary | ICD-10-CM | POA: Diagnosis not present

## 2021-04-21 DIAGNOSIS — I10 Essential (primary) hypertension: Secondary | ICD-10-CM | POA: Diagnosis not present

## 2021-04-21 DIAGNOSIS — Z5189 Encounter for other specified aftercare: Secondary | ICD-10-CM | POA: Diagnosis not present

## 2021-04-21 DIAGNOSIS — K219 Gastro-esophageal reflux disease without esophagitis: Secondary | ICD-10-CM | POA: Diagnosis not present

## 2021-04-21 DIAGNOSIS — Z87891 Personal history of nicotine dependence: Secondary | ICD-10-CM | POA: Diagnosis not present

## 2021-04-21 DIAGNOSIS — Z9049 Acquired absence of other specified parts of digestive tract: Secondary | ICD-10-CM | POA: Diagnosis not present

## 2021-04-21 DIAGNOSIS — M4802 Spinal stenosis, cervical region: Secondary | ICD-10-CM | POA: Diagnosis not present

## 2021-04-21 DIAGNOSIS — G620 Drug-induced polyneuropathy: Secondary | ICD-10-CM | POA: Diagnosis not present

## 2021-04-21 DIAGNOSIS — E785 Hyperlipidemia, unspecified: Secondary | ICD-10-CM | POA: Diagnosis not present

## 2021-04-21 DIAGNOSIS — Z9221 Personal history of antineoplastic chemotherapy: Secondary | ICD-10-CM | POA: Diagnosis not present

## 2021-04-21 DIAGNOSIS — Z170 Estrogen receptor positive status [ER+]: Secondary | ICD-10-CM | POA: Diagnosis not present

## 2021-04-21 DIAGNOSIS — Z7901 Long term (current) use of anticoagulants: Secondary | ICD-10-CM | POA: Diagnosis not present

## 2021-04-21 DIAGNOSIS — Z299 Encounter for prophylactic measures, unspecified: Secondary | ICD-10-CM | POA: Diagnosis not present

## 2021-04-21 DIAGNOSIS — E119 Type 2 diabetes mellitus without complications: Secondary | ICD-10-CM | POA: Diagnosis not present

## 2021-04-21 DIAGNOSIS — Z7984 Long term (current) use of oral hypoglycemic drugs: Secondary | ICD-10-CM | POA: Diagnosis not present

## 2021-04-21 DIAGNOSIS — R519 Headache, unspecified: Secondary | ICD-10-CM | POA: Diagnosis not present

## 2021-04-21 DIAGNOSIS — Z808 Family history of malignant neoplasm of other organs or systems: Secondary | ICD-10-CM | POA: Diagnosis not present

## 2021-04-21 DIAGNOSIS — M542 Cervicalgia: Secondary | ICD-10-CM | POA: Diagnosis not present

## 2021-04-21 DIAGNOSIS — C50811 Malignant neoplasm of overlapping sites of right female breast: Secondary | ICD-10-CM | POA: Diagnosis not present

## 2021-04-21 DIAGNOSIS — T451X5A Adverse effect of antineoplastic and immunosuppressive drugs, initial encounter: Secondary | ICD-10-CM | POA: Diagnosis not present

## 2021-04-22 DIAGNOSIS — Z7901 Long term (current) use of anticoagulants: Secondary | ICD-10-CM | POA: Diagnosis not present

## 2021-04-22 DIAGNOSIS — E785 Hyperlipidemia, unspecified: Secondary | ICD-10-CM | POA: Diagnosis not present

## 2021-04-22 DIAGNOSIS — M542 Cervicalgia: Secondary | ICD-10-CM | POA: Diagnosis not present

## 2021-04-22 DIAGNOSIS — Z803 Family history of malignant neoplasm of breast: Secondary | ICD-10-CM | POA: Diagnosis not present

## 2021-04-22 DIAGNOSIS — Z79899 Other long term (current) drug therapy: Secondary | ICD-10-CM | POA: Diagnosis not present

## 2021-04-22 DIAGNOSIS — C50811 Malignant neoplasm of overlapping sites of right female breast: Secondary | ICD-10-CM | POA: Diagnosis not present

## 2021-04-22 DIAGNOSIS — Z9049 Acquired absence of other specified parts of digestive tract: Secondary | ICD-10-CM | POA: Diagnosis not present

## 2021-04-22 DIAGNOSIS — Z170 Estrogen receptor positive status [ER+]: Secondary | ICD-10-CM | POA: Diagnosis not present

## 2021-04-22 DIAGNOSIS — I10 Essential (primary) hypertension: Secondary | ICD-10-CM | POA: Diagnosis not present

## 2021-04-22 DIAGNOSIS — Z808 Family history of malignant neoplasm of other organs or systems: Secondary | ICD-10-CM | POA: Diagnosis not present

## 2021-04-22 DIAGNOSIS — Z87891 Personal history of nicotine dependence: Secondary | ICD-10-CM | POA: Diagnosis not present

## 2021-04-22 DIAGNOSIS — R519 Headache, unspecified: Secondary | ICD-10-CM | POA: Diagnosis not present

## 2021-04-22 DIAGNOSIS — K219 Gastro-esophageal reflux disease without esophagitis: Secondary | ICD-10-CM | POA: Diagnosis not present

## 2021-04-22 DIAGNOSIS — Z9221 Personal history of antineoplastic chemotherapy: Secondary | ICD-10-CM | POA: Diagnosis not present

## 2021-04-22 DIAGNOSIS — E119 Type 2 diabetes mellitus without complications: Secondary | ICD-10-CM | POA: Diagnosis not present

## 2021-04-22 DIAGNOSIS — Z7984 Long term (current) use of oral hypoglycemic drugs: Secondary | ICD-10-CM | POA: Diagnosis not present

## 2021-04-23 DIAGNOSIS — Z9049 Acquired absence of other specified parts of digestive tract: Secondary | ICD-10-CM | POA: Diagnosis not present

## 2021-04-23 DIAGNOSIS — Z7984 Long term (current) use of oral hypoglycemic drugs: Secondary | ICD-10-CM | POA: Diagnosis not present

## 2021-04-23 DIAGNOSIS — R030 Elevated blood-pressure reading, without diagnosis of hypertension: Secondary | ICD-10-CM | POA: Diagnosis not present

## 2021-04-23 DIAGNOSIS — Z7901 Long term (current) use of anticoagulants: Secondary | ICD-10-CM | POA: Diagnosis not present

## 2021-04-23 DIAGNOSIS — K219 Gastro-esophageal reflux disease without esophagitis: Secondary | ICD-10-CM | POA: Diagnosis not present

## 2021-04-23 DIAGNOSIS — C50811 Malignant neoplasm of overlapping sites of right female breast: Secondary | ICD-10-CM | POA: Diagnosis not present

## 2021-04-23 DIAGNOSIS — N390 Urinary tract infection, site not specified: Secondary | ICD-10-CM | POA: Diagnosis not present

## 2021-04-23 DIAGNOSIS — Z87891 Personal history of nicotine dependence: Secondary | ICD-10-CM | POA: Diagnosis not present

## 2021-04-23 DIAGNOSIS — E119 Type 2 diabetes mellitus without complications: Secondary | ICD-10-CM | POA: Diagnosis not present

## 2021-04-23 DIAGNOSIS — R519 Headache, unspecified: Secondary | ICD-10-CM | POA: Diagnosis not present

## 2021-04-23 DIAGNOSIS — Z170 Estrogen receptor positive status [ER+]: Secondary | ICD-10-CM | POA: Diagnosis not present

## 2021-04-23 DIAGNOSIS — I10 Essential (primary) hypertension: Secondary | ICD-10-CM | POA: Diagnosis not present

## 2021-04-23 DIAGNOSIS — R350 Frequency of micturition: Secondary | ICD-10-CM | POA: Diagnosis not present

## 2021-04-23 DIAGNOSIS — Z808 Family history of malignant neoplasm of other organs or systems: Secondary | ICD-10-CM | POA: Diagnosis not present

## 2021-04-23 DIAGNOSIS — Z299 Encounter for prophylactic measures, unspecified: Secondary | ICD-10-CM | POA: Diagnosis not present

## 2021-04-23 DIAGNOSIS — M542 Cervicalgia: Secondary | ICD-10-CM | POA: Diagnosis not present

## 2021-04-23 DIAGNOSIS — E785 Hyperlipidemia, unspecified: Secondary | ICD-10-CM | POA: Diagnosis not present

## 2021-04-23 DIAGNOSIS — Z803 Family history of malignant neoplasm of breast: Secondary | ICD-10-CM | POA: Diagnosis not present

## 2021-04-23 DIAGNOSIS — C50919 Malignant neoplasm of unspecified site of unspecified female breast: Secondary | ICD-10-CM | POA: Diagnosis not present

## 2021-04-23 DIAGNOSIS — Z79899 Other long term (current) drug therapy: Secondary | ICD-10-CM | POA: Diagnosis not present

## 2021-04-23 DIAGNOSIS — Z9221 Personal history of antineoplastic chemotherapy: Secondary | ICD-10-CM | POA: Diagnosis not present

## 2021-04-26 DIAGNOSIS — Z170 Estrogen receptor positive status [ER+]: Secondary | ICD-10-CM | POA: Diagnosis not present

## 2021-04-26 DIAGNOSIS — C50811 Malignant neoplasm of overlapping sites of right female breast: Secondary | ICD-10-CM | POA: Diagnosis not present

## 2021-04-26 DIAGNOSIS — Z9221 Personal history of antineoplastic chemotherapy: Secondary | ICD-10-CM | POA: Diagnosis not present

## 2021-04-26 DIAGNOSIS — Z87891 Personal history of nicotine dependence: Secondary | ICD-10-CM | POA: Diagnosis not present

## 2021-04-26 DIAGNOSIS — M542 Cervicalgia: Secondary | ICD-10-CM | POA: Diagnosis not present

## 2021-04-26 DIAGNOSIS — K219 Gastro-esophageal reflux disease without esophagitis: Secondary | ICD-10-CM | POA: Diagnosis not present

## 2021-04-26 DIAGNOSIS — R519 Headache, unspecified: Secondary | ICD-10-CM | POA: Diagnosis not present

## 2021-04-26 DIAGNOSIS — Z803 Family history of malignant neoplasm of breast: Secondary | ICD-10-CM | POA: Diagnosis not present

## 2021-04-26 DIAGNOSIS — I10 Essential (primary) hypertension: Secondary | ICD-10-CM | POA: Diagnosis not present

## 2021-04-26 DIAGNOSIS — E785 Hyperlipidemia, unspecified: Secondary | ICD-10-CM | POA: Diagnosis not present

## 2021-04-26 DIAGNOSIS — Z808 Family history of malignant neoplasm of other organs or systems: Secondary | ICD-10-CM | POA: Diagnosis not present

## 2021-04-26 DIAGNOSIS — Z7984 Long term (current) use of oral hypoglycemic drugs: Secondary | ICD-10-CM | POA: Diagnosis not present

## 2021-04-26 DIAGNOSIS — Z9049 Acquired absence of other specified parts of digestive tract: Secondary | ICD-10-CM | POA: Diagnosis not present

## 2021-04-26 DIAGNOSIS — Z7901 Long term (current) use of anticoagulants: Secondary | ICD-10-CM | POA: Diagnosis not present

## 2021-04-26 DIAGNOSIS — E119 Type 2 diabetes mellitus without complications: Secondary | ICD-10-CM | POA: Diagnosis not present

## 2021-04-26 DIAGNOSIS — Z79899 Other long term (current) drug therapy: Secondary | ICD-10-CM | POA: Diagnosis not present

## 2021-04-27 ENCOUNTER — Other Ambulatory Visit (HOSPITAL_COMMUNITY): Payer: Self-pay | Admitting: Neurology

## 2021-04-27 ENCOUNTER — Other Ambulatory Visit: Payer: Self-pay | Admitting: Neurology

## 2021-04-27 DIAGNOSIS — G629 Polyneuropathy, unspecified: Secondary | ICD-10-CM | POA: Diagnosis not present

## 2021-04-27 DIAGNOSIS — C50811 Malignant neoplasm of overlapping sites of right female breast: Secondary | ICD-10-CM | POA: Diagnosis not present

## 2021-04-27 DIAGNOSIS — R519 Headache, unspecified: Secondary | ICD-10-CM | POA: Diagnosis not present

## 2021-04-27 DIAGNOSIS — Z79899 Other long term (current) drug therapy: Secondary | ICD-10-CM | POA: Diagnosis not present

## 2021-04-27 DIAGNOSIS — Z170 Estrogen receptor positive status [ER+]: Secondary | ICD-10-CM | POA: Diagnosis not present

## 2021-04-27 DIAGNOSIS — I10 Essential (primary) hypertension: Secondary | ICD-10-CM | POA: Diagnosis not present

## 2021-04-27 DIAGNOSIS — Z803 Family history of malignant neoplasm of breast: Secondary | ICD-10-CM | POA: Diagnosis not present

## 2021-04-27 DIAGNOSIS — Z7984 Long term (current) use of oral hypoglycemic drugs: Secondary | ICD-10-CM | POA: Diagnosis not present

## 2021-04-27 DIAGNOSIS — G4489 Other headache syndrome: Secondary | ICD-10-CM | POA: Diagnosis not present

## 2021-04-27 DIAGNOSIS — Z808 Family history of malignant neoplasm of other organs or systems: Secondary | ICD-10-CM | POA: Diagnosis not present

## 2021-04-27 DIAGNOSIS — Z9221 Personal history of antineoplastic chemotherapy: Secondary | ICD-10-CM | POA: Diagnosis not present

## 2021-04-27 DIAGNOSIS — Z9049 Acquired absence of other specified parts of digestive tract: Secondary | ICD-10-CM | POA: Diagnosis not present

## 2021-04-27 DIAGNOSIS — M542 Cervicalgia: Secondary | ICD-10-CM | POA: Diagnosis not present

## 2021-04-27 DIAGNOSIS — Z87891 Personal history of nicotine dependence: Secondary | ICD-10-CM | POA: Diagnosis not present

## 2021-04-27 DIAGNOSIS — K219 Gastro-esophageal reflux disease without esophagitis: Secondary | ICD-10-CM | POA: Diagnosis not present

## 2021-04-27 DIAGNOSIS — E119 Type 2 diabetes mellitus without complications: Secondary | ICD-10-CM | POA: Diagnosis not present

## 2021-04-27 DIAGNOSIS — Z7901 Long term (current) use of anticoagulants: Secondary | ICD-10-CM | POA: Diagnosis not present

## 2021-04-27 DIAGNOSIS — E785 Hyperlipidemia, unspecified: Secondary | ICD-10-CM | POA: Diagnosis not present

## 2021-04-28 DIAGNOSIS — I10 Essential (primary) hypertension: Secondary | ICD-10-CM | POA: Diagnosis not present

## 2021-04-28 DIAGNOSIS — Z808 Family history of malignant neoplasm of other organs or systems: Secondary | ICD-10-CM | POA: Diagnosis not present

## 2021-04-28 DIAGNOSIS — E119 Type 2 diabetes mellitus without complications: Secondary | ICD-10-CM | POA: Diagnosis not present

## 2021-04-28 DIAGNOSIS — K219 Gastro-esophageal reflux disease without esophagitis: Secondary | ICD-10-CM | POA: Diagnosis not present

## 2021-04-28 DIAGNOSIS — Z7984 Long term (current) use of oral hypoglycemic drugs: Secondary | ICD-10-CM | POA: Diagnosis not present

## 2021-04-28 DIAGNOSIS — Z803 Family history of malignant neoplasm of breast: Secondary | ICD-10-CM | POA: Diagnosis not present

## 2021-04-28 DIAGNOSIS — M542 Cervicalgia: Secondary | ICD-10-CM | POA: Diagnosis not present

## 2021-04-28 DIAGNOSIS — Z87891 Personal history of nicotine dependence: Secondary | ICD-10-CM | POA: Diagnosis not present

## 2021-04-28 DIAGNOSIS — C50811 Malignant neoplasm of overlapping sites of right female breast: Secondary | ICD-10-CM | POA: Diagnosis not present

## 2021-04-28 DIAGNOSIS — E785 Hyperlipidemia, unspecified: Secondary | ICD-10-CM | POA: Diagnosis not present

## 2021-04-28 DIAGNOSIS — Z9049 Acquired absence of other specified parts of digestive tract: Secondary | ICD-10-CM | POA: Diagnosis not present

## 2021-04-28 DIAGNOSIS — R519 Headache, unspecified: Secondary | ICD-10-CM | POA: Diagnosis not present

## 2021-04-28 DIAGNOSIS — Z79899 Other long term (current) drug therapy: Secondary | ICD-10-CM | POA: Diagnosis not present

## 2021-04-28 DIAGNOSIS — Z7901 Long term (current) use of anticoagulants: Secondary | ICD-10-CM | POA: Diagnosis not present

## 2021-04-28 DIAGNOSIS — Z170 Estrogen receptor positive status [ER+]: Secondary | ICD-10-CM | POA: Diagnosis not present

## 2021-04-28 DIAGNOSIS — Z9221 Personal history of antineoplastic chemotherapy: Secondary | ICD-10-CM | POA: Diagnosis not present

## 2021-04-29 DIAGNOSIS — Z7984 Long term (current) use of oral hypoglycemic drugs: Secondary | ICD-10-CM | POA: Diagnosis not present

## 2021-04-29 DIAGNOSIS — Z170 Estrogen receptor positive status [ER+]: Secondary | ICD-10-CM | POA: Diagnosis not present

## 2021-04-29 DIAGNOSIS — Z79899 Other long term (current) drug therapy: Secondary | ICD-10-CM | POA: Diagnosis not present

## 2021-04-29 DIAGNOSIS — Z808 Family history of malignant neoplasm of other organs or systems: Secondary | ICD-10-CM | POA: Diagnosis not present

## 2021-04-29 DIAGNOSIS — E785 Hyperlipidemia, unspecified: Secondary | ICD-10-CM | POA: Diagnosis not present

## 2021-04-29 DIAGNOSIS — Z87891 Personal history of nicotine dependence: Secondary | ICD-10-CM | POA: Diagnosis not present

## 2021-04-29 DIAGNOSIS — Z9221 Personal history of antineoplastic chemotherapy: Secondary | ICD-10-CM | POA: Diagnosis not present

## 2021-04-29 DIAGNOSIS — Z9049 Acquired absence of other specified parts of digestive tract: Secondary | ICD-10-CM | POA: Diagnosis not present

## 2021-04-29 DIAGNOSIS — C50811 Malignant neoplasm of overlapping sites of right female breast: Secondary | ICD-10-CM | POA: Diagnosis not present

## 2021-04-29 DIAGNOSIS — Z803 Family history of malignant neoplasm of breast: Secondary | ICD-10-CM | POA: Diagnosis not present

## 2021-04-29 DIAGNOSIS — K219 Gastro-esophageal reflux disease without esophagitis: Secondary | ICD-10-CM | POA: Diagnosis not present

## 2021-04-29 DIAGNOSIS — R519 Headache, unspecified: Secondary | ICD-10-CM | POA: Diagnosis not present

## 2021-04-29 DIAGNOSIS — I10 Essential (primary) hypertension: Secondary | ICD-10-CM | POA: Diagnosis not present

## 2021-04-29 DIAGNOSIS — Z7901 Long term (current) use of anticoagulants: Secondary | ICD-10-CM | POA: Diagnosis not present

## 2021-04-29 DIAGNOSIS — E119 Type 2 diabetes mellitus without complications: Secondary | ICD-10-CM | POA: Diagnosis not present

## 2021-04-29 DIAGNOSIS — M542 Cervicalgia: Secondary | ICD-10-CM | POA: Diagnosis not present

## 2021-04-30 DIAGNOSIS — C50811 Malignant neoplasm of overlapping sites of right female breast: Secondary | ICD-10-CM | POA: Diagnosis not present

## 2021-04-30 DIAGNOSIS — M542 Cervicalgia: Secondary | ICD-10-CM | POA: Diagnosis not present

## 2021-04-30 DIAGNOSIS — R519 Headache, unspecified: Secondary | ICD-10-CM | POA: Diagnosis not present

## 2021-04-30 DIAGNOSIS — E119 Type 2 diabetes mellitus without complications: Secondary | ICD-10-CM | POA: Diagnosis not present

## 2021-04-30 DIAGNOSIS — Z808 Family history of malignant neoplasm of other organs or systems: Secondary | ICD-10-CM | POA: Diagnosis not present

## 2021-04-30 DIAGNOSIS — Z7901 Long term (current) use of anticoagulants: Secondary | ICD-10-CM | POA: Diagnosis not present

## 2021-04-30 DIAGNOSIS — Z7984 Long term (current) use of oral hypoglycemic drugs: Secondary | ICD-10-CM | POA: Diagnosis not present

## 2021-04-30 DIAGNOSIS — I10 Essential (primary) hypertension: Secondary | ICD-10-CM | POA: Diagnosis not present

## 2021-04-30 DIAGNOSIS — Z803 Family history of malignant neoplasm of breast: Secondary | ICD-10-CM | POA: Diagnosis not present

## 2021-04-30 DIAGNOSIS — Z170 Estrogen receptor positive status [ER+]: Secondary | ICD-10-CM | POA: Diagnosis not present

## 2021-04-30 DIAGNOSIS — E785 Hyperlipidemia, unspecified: Secondary | ICD-10-CM | POA: Diagnosis not present

## 2021-04-30 DIAGNOSIS — Z87891 Personal history of nicotine dependence: Secondary | ICD-10-CM | POA: Diagnosis not present

## 2021-04-30 DIAGNOSIS — K219 Gastro-esophageal reflux disease without esophagitis: Secondary | ICD-10-CM | POA: Diagnosis not present

## 2021-04-30 DIAGNOSIS — Z9221 Personal history of antineoplastic chemotherapy: Secondary | ICD-10-CM | POA: Diagnosis not present

## 2021-04-30 DIAGNOSIS — Z79899 Other long term (current) drug therapy: Secondary | ICD-10-CM | POA: Diagnosis not present

## 2021-04-30 DIAGNOSIS — Z9049 Acquired absence of other specified parts of digestive tract: Secondary | ICD-10-CM | POA: Diagnosis not present

## 2021-05-03 DIAGNOSIS — Z170 Estrogen receptor positive status [ER+]: Secondary | ICD-10-CM | POA: Diagnosis not present

## 2021-05-03 DIAGNOSIS — Z803 Family history of malignant neoplasm of breast: Secondary | ICD-10-CM | POA: Diagnosis not present

## 2021-05-03 DIAGNOSIS — Z9221 Personal history of antineoplastic chemotherapy: Secondary | ICD-10-CM | POA: Diagnosis not present

## 2021-05-03 DIAGNOSIS — Z79899 Other long term (current) drug therapy: Secondary | ICD-10-CM | POA: Diagnosis not present

## 2021-05-03 DIAGNOSIS — K219 Gastro-esophageal reflux disease without esophagitis: Secondary | ICD-10-CM | POA: Diagnosis not present

## 2021-05-03 DIAGNOSIS — Z808 Family history of malignant neoplasm of other organs or systems: Secondary | ICD-10-CM | POA: Diagnosis not present

## 2021-05-03 DIAGNOSIS — R519 Headache, unspecified: Secondary | ICD-10-CM | POA: Diagnosis not present

## 2021-05-03 DIAGNOSIS — Z7901 Long term (current) use of anticoagulants: Secondary | ICD-10-CM | POA: Diagnosis not present

## 2021-05-03 DIAGNOSIS — C50811 Malignant neoplasm of overlapping sites of right female breast: Secondary | ICD-10-CM | POA: Diagnosis not present

## 2021-05-03 DIAGNOSIS — M542 Cervicalgia: Secondary | ICD-10-CM | POA: Diagnosis not present

## 2021-05-03 DIAGNOSIS — E119 Type 2 diabetes mellitus without complications: Secondary | ICD-10-CM | POA: Diagnosis not present

## 2021-05-03 DIAGNOSIS — Z7984 Long term (current) use of oral hypoglycemic drugs: Secondary | ICD-10-CM | POA: Diagnosis not present

## 2021-05-03 DIAGNOSIS — Z9049 Acquired absence of other specified parts of digestive tract: Secondary | ICD-10-CM | POA: Diagnosis not present

## 2021-05-03 DIAGNOSIS — E785 Hyperlipidemia, unspecified: Secondary | ICD-10-CM | POA: Diagnosis not present

## 2021-05-03 DIAGNOSIS — I10 Essential (primary) hypertension: Secondary | ICD-10-CM | POA: Diagnosis not present

## 2021-05-03 DIAGNOSIS — Z87891 Personal history of nicotine dependence: Secondary | ICD-10-CM | POA: Diagnosis not present

## 2021-05-04 DIAGNOSIS — Z170 Estrogen receptor positive status [ER+]: Secondary | ICD-10-CM | POA: Diagnosis not present

## 2021-05-04 DIAGNOSIS — E119 Type 2 diabetes mellitus without complications: Secondary | ICD-10-CM | POA: Diagnosis not present

## 2021-05-04 DIAGNOSIS — Z7984 Long term (current) use of oral hypoglycemic drugs: Secondary | ICD-10-CM | POA: Diagnosis not present

## 2021-05-04 DIAGNOSIS — E785 Hyperlipidemia, unspecified: Secondary | ICD-10-CM | POA: Diagnosis not present

## 2021-05-04 DIAGNOSIS — Z87891 Personal history of nicotine dependence: Secondary | ICD-10-CM | POA: Diagnosis not present

## 2021-05-04 DIAGNOSIS — Z9221 Personal history of antineoplastic chemotherapy: Secondary | ICD-10-CM | POA: Diagnosis not present

## 2021-05-04 DIAGNOSIS — M542 Cervicalgia: Secondary | ICD-10-CM | POA: Diagnosis not present

## 2021-05-04 DIAGNOSIS — Z9049 Acquired absence of other specified parts of digestive tract: Secondary | ICD-10-CM | POA: Diagnosis not present

## 2021-05-04 DIAGNOSIS — K219 Gastro-esophageal reflux disease without esophagitis: Secondary | ICD-10-CM | POA: Diagnosis not present

## 2021-05-04 DIAGNOSIS — Z808 Family history of malignant neoplasm of other organs or systems: Secondary | ICD-10-CM | POA: Diagnosis not present

## 2021-05-04 DIAGNOSIS — I10 Essential (primary) hypertension: Secondary | ICD-10-CM | POA: Diagnosis not present

## 2021-05-04 DIAGNOSIS — R519 Headache, unspecified: Secondary | ICD-10-CM | POA: Diagnosis not present

## 2021-05-04 DIAGNOSIS — Z79899 Other long term (current) drug therapy: Secondary | ICD-10-CM | POA: Diagnosis not present

## 2021-05-04 DIAGNOSIS — Z7901 Long term (current) use of anticoagulants: Secondary | ICD-10-CM | POA: Diagnosis not present

## 2021-05-04 DIAGNOSIS — C50811 Malignant neoplasm of overlapping sites of right female breast: Secondary | ICD-10-CM | POA: Diagnosis not present

## 2021-05-04 DIAGNOSIS — Z803 Family history of malignant neoplasm of breast: Secondary | ICD-10-CM | POA: Diagnosis not present

## 2021-05-05 DIAGNOSIS — E785 Hyperlipidemia, unspecified: Secondary | ICD-10-CM | POA: Diagnosis not present

## 2021-05-05 DIAGNOSIS — Z9049 Acquired absence of other specified parts of digestive tract: Secondary | ICD-10-CM | POA: Diagnosis not present

## 2021-05-05 DIAGNOSIS — Z803 Family history of malignant neoplasm of breast: Secondary | ICD-10-CM | POA: Diagnosis not present

## 2021-05-05 DIAGNOSIS — Z808 Family history of malignant neoplasm of other organs or systems: Secondary | ICD-10-CM | POA: Diagnosis not present

## 2021-05-05 DIAGNOSIS — M542 Cervicalgia: Secondary | ICD-10-CM | POA: Diagnosis not present

## 2021-05-05 DIAGNOSIS — R519 Headache, unspecified: Secondary | ICD-10-CM | POA: Diagnosis not present

## 2021-05-05 DIAGNOSIS — Z79899 Other long term (current) drug therapy: Secondary | ICD-10-CM | POA: Diagnosis not present

## 2021-05-05 DIAGNOSIS — Z170 Estrogen receptor positive status [ER+]: Secondary | ICD-10-CM | POA: Diagnosis not present

## 2021-05-05 DIAGNOSIS — Z87891 Personal history of nicotine dependence: Secondary | ICD-10-CM | POA: Diagnosis not present

## 2021-05-05 DIAGNOSIS — I10 Essential (primary) hypertension: Secondary | ICD-10-CM | POA: Diagnosis not present

## 2021-05-05 DIAGNOSIS — C50811 Malignant neoplasm of overlapping sites of right female breast: Secondary | ICD-10-CM | POA: Diagnosis not present

## 2021-05-05 DIAGNOSIS — Z7901 Long term (current) use of anticoagulants: Secondary | ICD-10-CM | POA: Diagnosis not present

## 2021-05-05 DIAGNOSIS — E119 Type 2 diabetes mellitus without complications: Secondary | ICD-10-CM | POA: Diagnosis not present

## 2021-05-05 DIAGNOSIS — K219 Gastro-esophageal reflux disease without esophagitis: Secondary | ICD-10-CM | POA: Diagnosis not present

## 2021-05-05 DIAGNOSIS — Z7984 Long term (current) use of oral hypoglycemic drugs: Secondary | ICD-10-CM | POA: Diagnosis not present

## 2021-05-05 DIAGNOSIS — Z9221 Personal history of antineoplastic chemotherapy: Secondary | ICD-10-CM | POA: Diagnosis not present

## 2021-05-06 DIAGNOSIS — Z7901 Long term (current) use of anticoagulants: Secondary | ICD-10-CM | POA: Diagnosis not present

## 2021-05-06 DIAGNOSIS — Z808 Family history of malignant neoplasm of other organs or systems: Secondary | ICD-10-CM | POA: Diagnosis not present

## 2021-05-06 DIAGNOSIS — Z79899 Other long term (current) drug therapy: Secondary | ICD-10-CM | POA: Diagnosis not present

## 2021-05-06 DIAGNOSIS — Z7984 Long term (current) use of oral hypoglycemic drugs: Secondary | ICD-10-CM | POA: Diagnosis not present

## 2021-05-06 DIAGNOSIS — C50811 Malignant neoplasm of overlapping sites of right female breast: Secondary | ICD-10-CM | POA: Diagnosis not present

## 2021-05-06 DIAGNOSIS — Z170 Estrogen receptor positive status [ER+]: Secondary | ICD-10-CM | POA: Diagnosis not present

## 2021-05-06 DIAGNOSIS — R519 Headache, unspecified: Secondary | ICD-10-CM | POA: Diagnosis not present

## 2021-05-06 DIAGNOSIS — I10 Essential (primary) hypertension: Secondary | ICD-10-CM | POA: Diagnosis not present

## 2021-05-06 DIAGNOSIS — Z9221 Personal history of antineoplastic chemotherapy: Secondary | ICD-10-CM | POA: Diagnosis not present

## 2021-05-06 DIAGNOSIS — E119 Type 2 diabetes mellitus without complications: Secondary | ICD-10-CM | POA: Diagnosis not present

## 2021-05-06 DIAGNOSIS — Z87891 Personal history of nicotine dependence: Secondary | ICD-10-CM | POA: Diagnosis not present

## 2021-05-06 DIAGNOSIS — M542 Cervicalgia: Secondary | ICD-10-CM | POA: Diagnosis not present

## 2021-05-06 DIAGNOSIS — Z803 Family history of malignant neoplasm of breast: Secondary | ICD-10-CM | POA: Diagnosis not present

## 2021-05-06 DIAGNOSIS — K219 Gastro-esophageal reflux disease without esophagitis: Secondary | ICD-10-CM | POA: Diagnosis not present

## 2021-05-06 DIAGNOSIS — Z9049 Acquired absence of other specified parts of digestive tract: Secondary | ICD-10-CM | POA: Diagnosis not present

## 2021-05-06 DIAGNOSIS — E785 Hyperlipidemia, unspecified: Secondary | ICD-10-CM | POA: Diagnosis not present

## 2021-05-10 DIAGNOSIS — Z808 Family history of malignant neoplasm of other organs or systems: Secondary | ICD-10-CM | POA: Diagnosis not present

## 2021-05-10 DIAGNOSIS — Z9221 Personal history of antineoplastic chemotherapy: Secondary | ICD-10-CM | POA: Diagnosis not present

## 2021-05-10 DIAGNOSIS — E119 Type 2 diabetes mellitus without complications: Secondary | ICD-10-CM | POA: Diagnosis not present

## 2021-05-10 DIAGNOSIS — E785 Hyperlipidemia, unspecified: Secondary | ICD-10-CM | POA: Diagnosis not present

## 2021-05-10 DIAGNOSIS — Z9049 Acquired absence of other specified parts of digestive tract: Secondary | ICD-10-CM | POA: Diagnosis not present

## 2021-05-10 DIAGNOSIS — Z7984 Long term (current) use of oral hypoglycemic drugs: Secondary | ICD-10-CM | POA: Diagnosis not present

## 2021-05-10 DIAGNOSIS — Z87891 Personal history of nicotine dependence: Secondary | ICD-10-CM | POA: Diagnosis not present

## 2021-05-10 DIAGNOSIS — Z803 Family history of malignant neoplasm of breast: Secondary | ICD-10-CM | POA: Diagnosis not present

## 2021-05-10 DIAGNOSIS — M542 Cervicalgia: Secondary | ICD-10-CM | POA: Diagnosis not present

## 2021-05-10 DIAGNOSIS — K219 Gastro-esophageal reflux disease without esophagitis: Secondary | ICD-10-CM | POA: Diagnosis not present

## 2021-05-10 DIAGNOSIS — Z79899 Other long term (current) drug therapy: Secondary | ICD-10-CM | POA: Diagnosis not present

## 2021-05-10 DIAGNOSIS — C50811 Malignant neoplasm of overlapping sites of right female breast: Secondary | ICD-10-CM | POA: Diagnosis not present

## 2021-05-10 DIAGNOSIS — R519 Headache, unspecified: Secondary | ICD-10-CM | POA: Diagnosis not present

## 2021-05-10 DIAGNOSIS — Z170 Estrogen receptor positive status [ER+]: Secondary | ICD-10-CM | POA: Diagnosis not present

## 2021-05-10 DIAGNOSIS — Z7901 Long term (current) use of anticoagulants: Secondary | ICD-10-CM | POA: Diagnosis not present

## 2021-05-10 DIAGNOSIS — I10 Essential (primary) hypertension: Secondary | ICD-10-CM | POA: Diagnosis not present

## 2021-05-11 DIAGNOSIS — R519 Headache, unspecified: Secondary | ICD-10-CM | POA: Diagnosis not present

## 2021-05-11 DIAGNOSIS — Z7901 Long term (current) use of anticoagulants: Secondary | ICD-10-CM | POA: Diagnosis not present

## 2021-05-11 DIAGNOSIS — E785 Hyperlipidemia, unspecified: Secondary | ICD-10-CM | POA: Diagnosis not present

## 2021-05-11 DIAGNOSIS — Z9049 Acquired absence of other specified parts of digestive tract: Secondary | ICD-10-CM | POA: Diagnosis not present

## 2021-05-11 DIAGNOSIS — C50811 Malignant neoplasm of overlapping sites of right female breast: Secondary | ICD-10-CM | POA: Diagnosis not present

## 2021-05-11 DIAGNOSIS — K219 Gastro-esophageal reflux disease without esophagitis: Secondary | ICD-10-CM | POA: Diagnosis not present

## 2021-05-11 DIAGNOSIS — E119 Type 2 diabetes mellitus without complications: Secondary | ICD-10-CM | POA: Diagnosis not present

## 2021-05-11 DIAGNOSIS — M542 Cervicalgia: Secondary | ICD-10-CM | POA: Diagnosis not present

## 2021-05-11 DIAGNOSIS — Z87891 Personal history of nicotine dependence: Secondary | ICD-10-CM | POA: Diagnosis not present

## 2021-05-11 DIAGNOSIS — Z79899 Other long term (current) drug therapy: Secondary | ICD-10-CM | POA: Diagnosis not present

## 2021-05-11 DIAGNOSIS — Z803 Family history of malignant neoplasm of breast: Secondary | ICD-10-CM | POA: Diagnosis not present

## 2021-05-11 DIAGNOSIS — Z808 Family history of malignant neoplasm of other organs or systems: Secondary | ICD-10-CM | POA: Diagnosis not present

## 2021-05-11 DIAGNOSIS — Z9221 Personal history of antineoplastic chemotherapy: Secondary | ICD-10-CM | POA: Diagnosis not present

## 2021-05-11 DIAGNOSIS — Z170 Estrogen receptor positive status [ER+]: Secondary | ICD-10-CM | POA: Diagnosis not present

## 2021-05-11 DIAGNOSIS — I10 Essential (primary) hypertension: Secondary | ICD-10-CM | POA: Diagnosis not present

## 2021-05-11 DIAGNOSIS — Z7984 Long term (current) use of oral hypoglycemic drugs: Secondary | ICD-10-CM | POA: Diagnosis not present

## 2021-05-12 ENCOUNTER — Ambulatory Visit (HOSPITAL_COMMUNITY)
Admission: RE | Admit: 2021-05-12 | Discharge: 2021-05-12 | Disposition: A | Discharge status: Home / Self Care | Payer: Medicare Other | Source: Ambulatory Visit | Attending: Neurology | Admitting: Neurology

## 2021-05-12 ENCOUNTER — Other Ambulatory Visit: Payer: Self-pay

## 2021-05-12 DIAGNOSIS — Z789 Other specified health status: Secondary | ICD-10-CM | POA: Diagnosis not present

## 2021-05-12 DIAGNOSIS — Z9221 Personal history of antineoplastic chemotherapy: Secondary | ICD-10-CM | POA: Diagnosis not present

## 2021-05-12 DIAGNOSIS — I10 Essential (primary) hypertension: Secondary | ICD-10-CM | POA: Diagnosis not present

## 2021-05-12 DIAGNOSIS — M542 Cervicalgia: Secondary | ICD-10-CM | POA: Diagnosis not present

## 2021-05-12 DIAGNOSIS — Z170 Estrogen receptor positive status [ER+]: Secondary | ICD-10-CM | POA: Diagnosis not present

## 2021-05-12 DIAGNOSIS — Z7901 Long term (current) use of anticoagulants: Secondary | ICD-10-CM | POA: Diagnosis not present

## 2021-05-12 DIAGNOSIS — Z87891 Personal history of nicotine dependence: Secondary | ICD-10-CM | POA: Diagnosis not present

## 2021-05-12 DIAGNOSIS — M545 Low back pain, unspecified: Secondary | ICD-10-CM | POA: Diagnosis not present

## 2021-05-12 DIAGNOSIS — Z808 Family history of malignant neoplasm of other organs or systems: Secondary | ICD-10-CM | POA: Diagnosis not present

## 2021-05-12 DIAGNOSIS — Z7984 Long term (current) use of oral hypoglycemic drugs: Secondary | ICD-10-CM | POA: Diagnosis not present

## 2021-05-12 DIAGNOSIS — Z803 Family history of malignant neoplasm of breast: Secondary | ICD-10-CM | POA: Diagnosis not present

## 2021-05-12 DIAGNOSIS — E119 Type 2 diabetes mellitus without complications: Secondary | ICD-10-CM | POA: Diagnosis not present

## 2021-05-12 DIAGNOSIS — C50811 Malignant neoplasm of overlapping sites of right female breast: Secondary | ICD-10-CM | POA: Diagnosis not present

## 2021-05-12 DIAGNOSIS — Z9049 Acquired absence of other specified parts of digestive tract: Secondary | ICD-10-CM | POA: Diagnosis not present

## 2021-05-12 DIAGNOSIS — G4489 Other headache syndrome: Secondary | ICD-10-CM | POA: Diagnosis not present

## 2021-05-12 DIAGNOSIS — Z299 Encounter for prophylactic measures, unspecified: Secondary | ICD-10-CM | POA: Diagnosis not present

## 2021-05-12 DIAGNOSIS — E1165 Type 2 diabetes mellitus with hyperglycemia: Secondary | ICD-10-CM | POA: Diagnosis not present

## 2021-05-12 DIAGNOSIS — Z79899 Other long term (current) drug therapy: Secondary | ICD-10-CM | POA: Diagnosis not present

## 2021-05-12 DIAGNOSIS — K219 Gastro-esophageal reflux disease without esophagitis: Secondary | ICD-10-CM | POA: Diagnosis not present

## 2021-05-12 DIAGNOSIS — R519 Headache, unspecified: Secondary | ICD-10-CM | POA: Diagnosis not present

## 2021-05-12 DIAGNOSIS — E785 Hyperlipidemia, unspecified: Secondary | ICD-10-CM | POA: Diagnosis not present

## 2021-05-12 IMAGING — MR MR CERVICAL SPINE W/O CM
5 series · 35 of 48 positions shown · non-contrast
Comparison: Cervical spine CT [DATE].

CLINICAL DATA: 69-year-old female with posterior neck pain for 2
months. Ossification of the posterior longitudinal ligament (OPLL).

EXAM:
MRI CERVICAL SPINE WITHOUT CONTRAST
TECHNIQUE: Multiplanar, multisequence MR imaging of the cervical spine was
performed. No intravenous contrast was administered.

[Series 5: T2 · sagittal · 3.0mm · 0.69mm/px · 6 of 15 slices shown (1 of 2)]
[im 1/15]
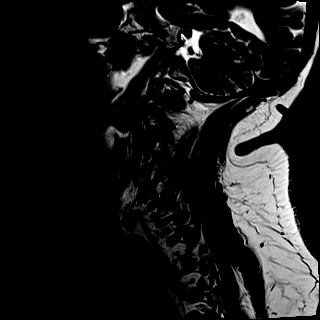
[im 3/15]
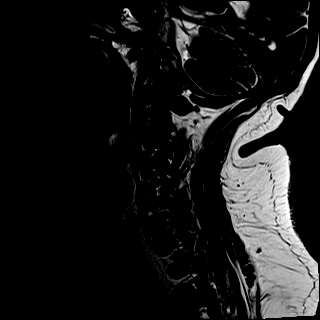
[im 6/15]
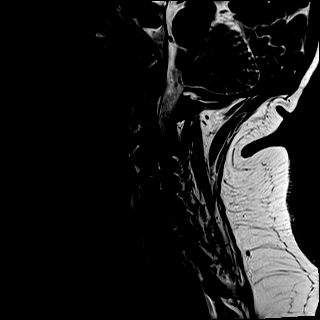
[im 9/15]
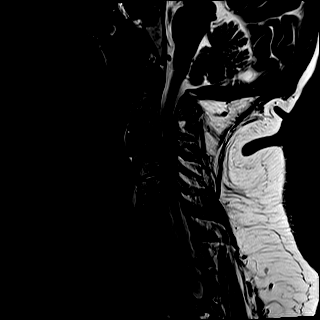
[im 12/15]
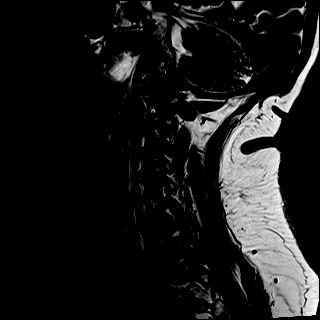
[im 15/15]
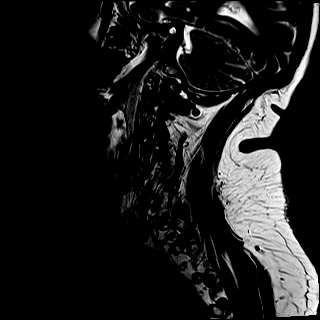

[Series 6: T1 · sagittal · 3.0mm · 0.86mm/px · 7 of 15 slices shown]
[im 1/15]
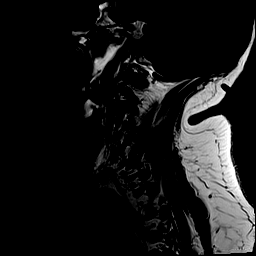
[im 3/15]
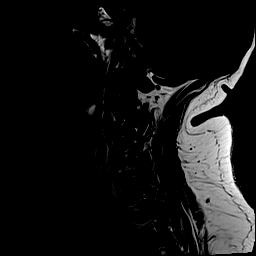
[im 5/15]
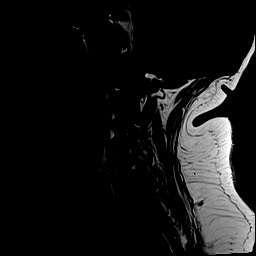
[im 8/15]
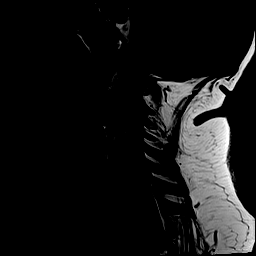
[im 10/15]
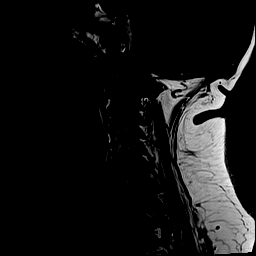
[im 12/15]
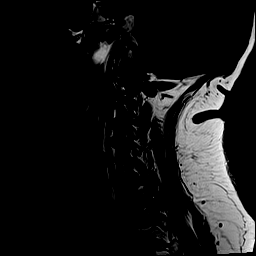
[im 15/15]
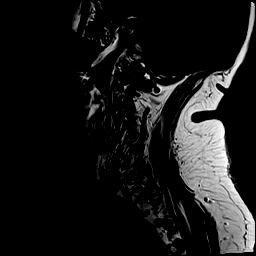

[Series 7: STIR · sagittal · 3.0mm · 0.69mm/px · 7 of 15 slices shown]
[im 1/15]
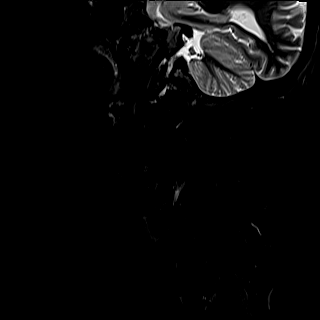
[im 3/15]
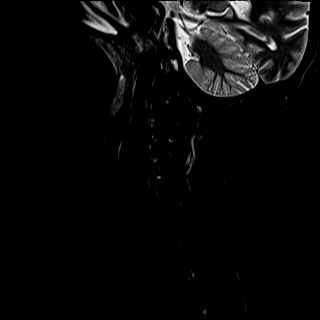
[im 5/15]
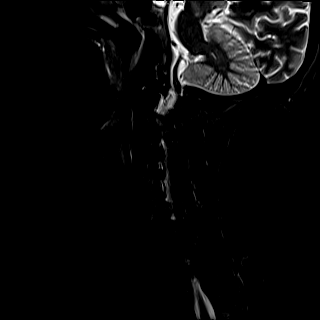
[im 8/15]
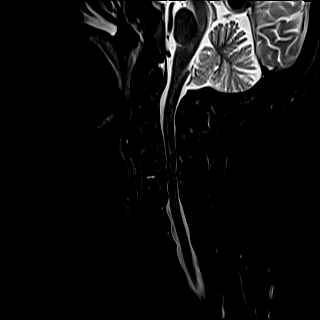
[im 10/15]
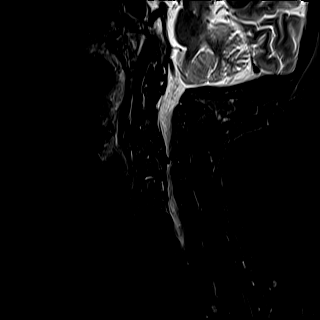
[im 12/15]
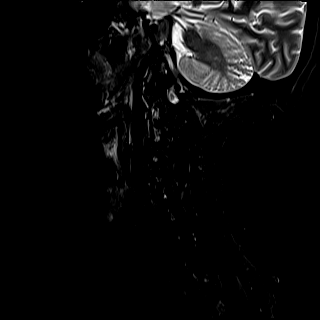
[im 15/15]
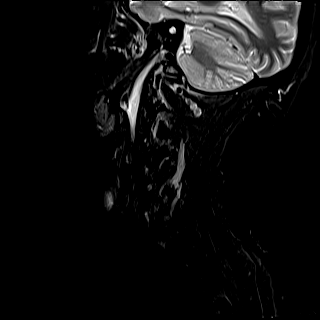

[Series 8: T2 · axial · 3.0mm · 0.70mm/px · z∈[-157,-56]mm · 8 of 32 slices shown (2 of 2)]
[im 1/32]
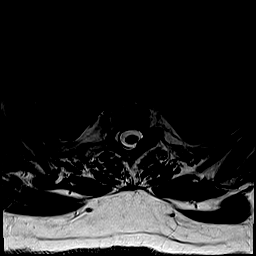
[im 5/32]
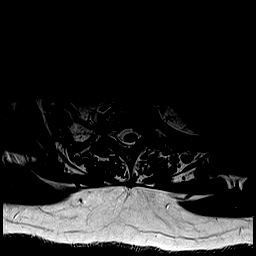
[im 10/32]
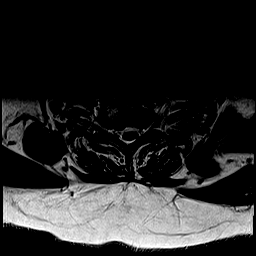
[im 15/32]
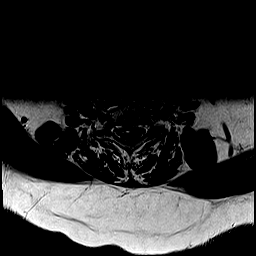
[im 17/32]
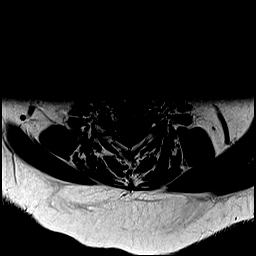
[im 22/32]
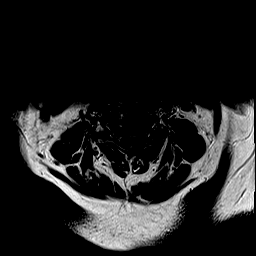
[im 27/32]
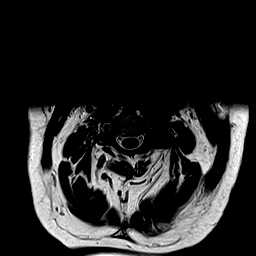
[im 32/32]
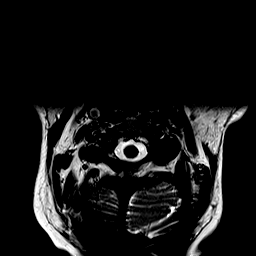

[Series 9: GRE · axial · 3.0mm · 0.35mm/px · z∈[-157,-72]mm · 7 of 32 slices shown]
[im 1/32]
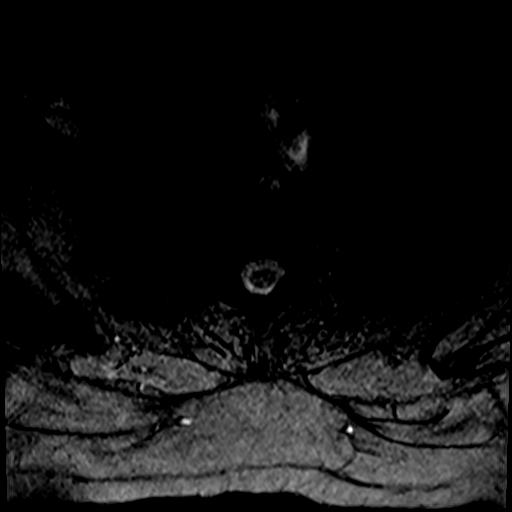
[im 5/32]
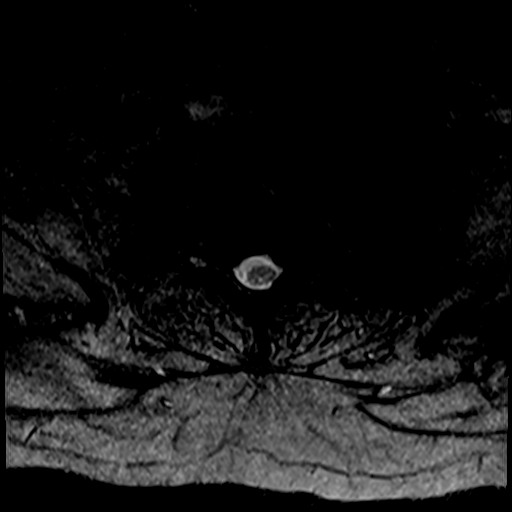
[im 10/32]
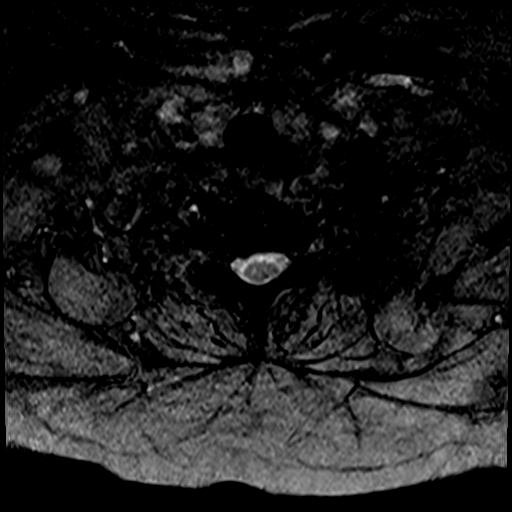
[im 15/32]
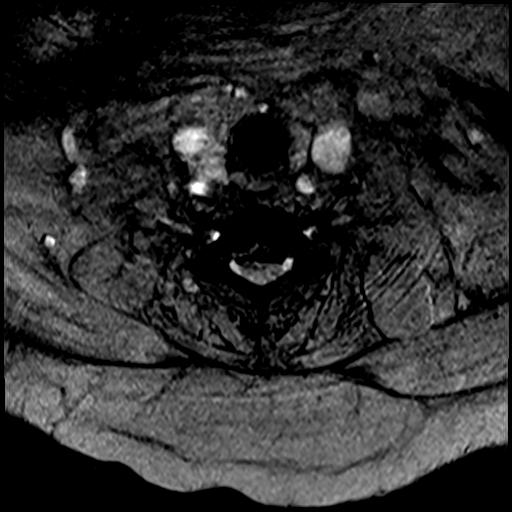
[im 17/32]
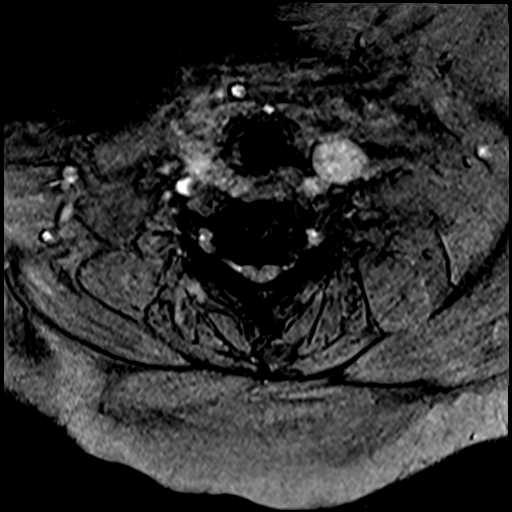
[im 22/32]
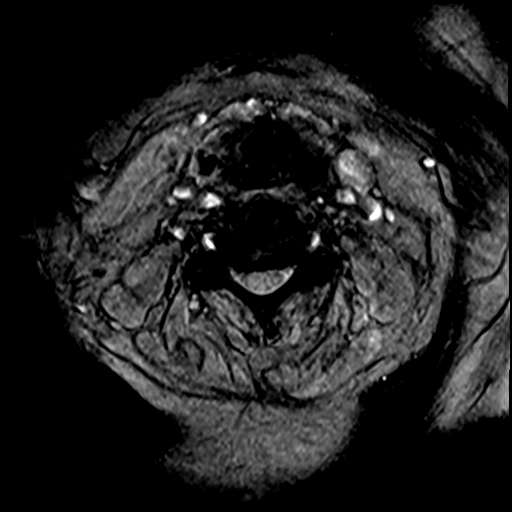
[im 27/32]
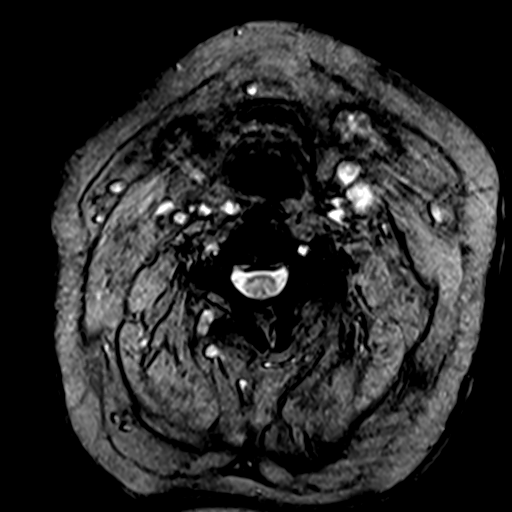

[35 of 48 positions shown; findings below may reference images not displayed]

FINDINGS: Alignment: Stable straightening of cervical lordosis. No significant
spondylolisthesis.

Vertebrae: Bulky anterior endplate osteophytes in the upper cervical
spine, and less pronounced evidence of the Ossification of the
posterior longitudinal ligament (OPLL) demonstrated by CT last year
from the C4 through C7 levels. Subsequent interbody ankylosis at
C4-C5, C5-C6 and C6-C7 better demonstrated by CT. Anterior endplate
osteophyte at C7-T1. No marrow edema or evidence of acute osseous
abnormality. Visualized bone marrow signal is within normal limits.

Cord: No spinal cord signal abnormality despite multilevel spinal
stenosis and cord mass effect detailed below.

Posterior Fossa, vertebral arteries, paraspinal tissues:
Cervicomedullary junction is within normal limits. Negative visible
posterior fossa and brain parenchyma. Preserved major vascular flow
voids in the neck. Vertebral arteries appear fairly codominant.
Negative visible neck soft tissues and lung apices.

Disc levels:

C2-C3: Mild to moderate facet hypertrophy greater on the left.
Moderate to severe left C3 foraminal stenosis.

C3-C4: Anterior eccentric circumferential disc osteophyte complex.
Mild to moderate facet hypertrophy greater on the left. Mild
ligament flavum hypertrophy. Mild spinal stenosis. Mild if any cord
mass effect. Moderate to severe left and mild right C4 foraminal
stenosis.

C4-C5: Disc space loss. Circumferential disc osteophyte complex and
OPLL. Spinal stenosis with mild cord mass effect. Moderate to severe
osseous foraminal stenosis appears greater on the right.

C5-C6: Circumferential disc osteophyte complex with OPLL. Mild
posterior element hypertrophy. Mild spinal stenosis and cord mass
effect. Osseous moderate to severe bilateral C6 foraminal stenosis.

C6-C7: Disc space loss with circumferential disc osteophyte complex
and less OPLL at this level. Mild spinal stenosis. Up to mild cord
mass effect. Mild to moderate osseous bilateral C7 foraminal
stenosis.

C7-T1: Subtle anterolisthesis. Mostly far lateral circumferential
disc osteophyte complex with mild facet and ligament flavum
hypertrophy. No spinal stenosis. Mild C8 foraminal stenosis greater
on the right.

Upper thoracic disc, endplate and posterior element degeneration
resulting in moderate to severe T1 and T2 neural foraminal stenosis
greater on the left.
IMPRESSION: 1. Multilevel cervical spine ankylosis with ossification of the
posterior longitudinal ligament (OPLL) as demonstrated by CT last
year contributes to spinal stenosis with mild cord mass effect C4-C5
through C6-C7. Mild spinal stenosis without cord mass effect at
C3-C4. No definite cord signal abnormality.
2. Widespread osseous neural foraminal stenosis is moderate or
severe at the left C3, left C4, bilateral C6 and left T1 nerve
levels.

## 2021-05-13 DIAGNOSIS — K219 Gastro-esophageal reflux disease without esophagitis: Secondary | ICD-10-CM | POA: Diagnosis not present

## 2021-05-13 DIAGNOSIS — R519 Headache, unspecified: Secondary | ICD-10-CM | POA: Diagnosis not present

## 2021-05-13 DIAGNOSIS — C50811 Malignant neoplasm of overlapping sites of right female breast: Secondary | ICD-10-CM | POA: Diagnosis not present

## 2021-05-13 DIAGNOSIS — Z9221 Personal history of antineoplastic chemotherapy: Secondary | ICD-10-CM | POA: Diagnosis not present

## 2021-05-13 DIAGNOSIS — Z7901 Long term (current) use of anticoagulants: Secondary | ICD-10-CM | POA: Diagnosis not present

## 2021-05-13 DIAGNOSIS — Z803 Family history of malignant neoplasm of breast: Secondary | ICD-10-CM | POA: Diagnosis not present

## 2021-05-13 DIAGNOSIS — Z170 Estrogen receptor positive status [ER+]: Secondary | ICD-10-CM | POA: Diagnosis not present

## 2021-05-13 DIAGNOSIS — Z79899 Other long term (current) drug therapy: Secondary | ICD-10-CM | POA: Diagnosis not present

## 2021-05-13 DIAGNOSIS — Z9049 Acquired absence of other specified parts of digestive tract: Secondary | ICD-10-CM | POA: Diagnosis not present

## 2021-05-13 DIAGNOSIS — Z87891 Personal history of nicotine dependence: Secondary | ICD-10-CM | POA: Diagnosis not present

## 2021-05-13 DIAGNOSIS — Z7984 Long term (current) use of oral hypoglycemic drugs: Secondary | ICD-10-CM | POA: Diagnosis not present

## 2021-05-13 DIAGNOSIS — E119 Type 2 diabetes mellitus without complications: Secondary | ICD-10-CM | POA: Diagnosis not present

## 2021-05-13 DIAGNOSIS — Z808 Family history of malignant neoplasm of other organs or systems: Secondary | ICD-10-CM | POA: Diagnosis not present

## 2021-05-13 DIAGNOSIS — E785 Hyperlipidemia, unspecified: Secondary | ICD-10-CM | POA: Diagnosis not present

## 2021-05-13 DIAGNOSIS — M542 Cervicalgia: Secondary | ICD-10-CM | POA: Diagnosis not present

## 2021-05-13 DIAGNOSIS — I10 Essential (primary) hypertension: Secondary | ICD-10-CM | POA: Diagnosis not present

## 2021-05-14 DIAGNOSIS — Z7984 Long term (current) use of oral hypoglycemic drugs: Secondary | ICD-10-CM | POA: Diagnosis not present

## 2021-05-14 DIAGNOSIS — E785 Hyperlipidemia, unspecified: Secondary | ICD-10-CM | POA: Diagnosis not present

## 2021-05-14 DIAGNOSIS — K219 Gastro-esophageal reflux disease without esophagitis: Secondary | ICD-10-CM | POA: Diagnosis not present

## 2021-05-14 DIAGNOSIS — Z9049 Acquired absence of other specified parts of digestive tract: Secondary | ICD-10-CM | POA: Diagnosis not present

## 2021-05-14 DIAGNOSIS — Z7901 Long term (current) use of anticoagulants: Secondary | ICD-10-CM | POA: Diagnosis not present

## 2021-05-14 DIAGNOSIS — Z87891 Personal history of nicotine dependence: Secondary | ICD-10-CM | POA: Diagnosis not present

## 2021-05-14 DIAGNOSIS — Z9221 Personal history of antineoplastic chemotherapy: Secondary | ICD-10-CM | POA: Diagnosis not present

## 2021-05-14 DIAGNOSIS — Z79899 Other long term (current) drug therapy: Secondary | ICD-10-CM | POA: Diagnosis not present

## 2021-05-14 DIAGNOSIS — Z803 Family history of malignant neoplasm of breast: Secondary | ICD-10-CM | POA: Diagnosis not present

## 2021-05-14 DIAGNOSIS — E1165 Type 2 diabetes mellitus with hyperglycemia: Secondary | ICD-10-CM | POA: Diagnosis not present

## 2021-05-14 DIAGNOSIS — Z170 Estrogen receptor positive status [ER+]: Secondary | ICD-10-CM | POA: Diagnosis not present

## 2021-05-14 DIAGNOSIS — M542 Cervicalgia: Secondary | ICD-10-CM | POA: Diagnosis not present

## 2021-05-14 DIAGNOSIS — I10 Essential (primary) hypertension: Secondary | ICD-10-CM | POA: Diagnosis not present

## 2021-05-14 DIAGNOSIS — E119 Type 2 diabetes mellitus without complications: Secondary | ICD-10-CM | POA: Diagnosis not present

## 2021-05-14 DIAGNOSIS — Z808 Family history of malignant neoplasm of other organs or systems: Secondary | ICD-10-CM | POA: Diagnosis not present

## 2021-05-14 DIAGNOSIS — C50811 Malignant neoplasm of overlapping sites of right female breast: Secondary | ICD-10-CM | POA: Diagnosis not present

## 2021-05-14 DIAGNOSIS — R519 Headache, unspecified: Secondary | ICD-10-CM | POA: Diagnosis not present

## 2021-05-16 DIAGNOSIS — E1165 Type 2 diabetes mellitus with hyperglycemia: Secondary | ICD-10-CM | POA: Diagnosis not present

## 2021-05-17 DIAGNOSIS — M542 Cervicalgia: Secondary | ICD-10-CM | POA: Diagnosis not present

## 2021-05-17 DIAGNOSIS — E785 Hyperlipidemia, unspecified: Secondary | ICD-10-CM | POA: Diagnosis not present

## 2021-05-17 DIAGNOSIS — Z7984 Long term (current) use of oral hypoglycemic drugs: Secondary | ICD-10-CM | POA: Diagnosis not present

## 2021-05-17 DIAGNOSIS — M545 Low back pain, unspecified: Secondary | ICD-10-CM | POA: Diagnosis not present

## 2021-05-17 DIAGNOSIS — C50811 Malignant neoplasm of overlapping sites of right female breast: Secondary | ICD-10-CM | POA: Diagnosis not present

## 2021-05-17 DIAGNOSIS — Z803 Family history of malignant neoplasm of breast: Secondary | ICD-10-CM | POA: Diagnosis not present

## 2021-05-17 DIAGNOSIS — N390 Urinary tract infection, site not specified: Secondary | ICD-10-CM | POA: Diagnosis not present

## 2021-05-17 DIAGNOSIS — E119 Type 2 diabetes mellitus without complications: Secondary | ICD-10-CM | POA: Diagnosis not present

## 2021-05-17 DIAGNOSIS — Z299 Encounter for prophylactic measures, unspecified: Secondary | ICD-10-CM | POA: Diagnosis not present

## 2021-05-17 DIAGNOSIS — Z9049 Acquired absence of other specified parts of digestive tract: Secondary | ICD-10-CM | POA: Diagnosis not present

## 2021-05-17 DIAGNOSIS — Z9221 Personal history of antineoplastic chemotherapy: Secondary | ICD-10-CM | POA: Diagnosis not present

## 2021-05-17 DIAGNOSIS — I10 Essential (primary) hypertension: Secondary | ICD-10-CM | POA: Diagnosis not present

## 2021-05-17 DIAGNOSIS — R519 Headache, unspecified: Secondary | ICD-10-CM | POA: Diagnosis not present

## 2021-05-17 DIAGNOSIS — Z7901 Long term (current) use of anticoagulants: Secondary | ICD-10-CM | POA: Diagnosis not present

## 2021-05-17 DIAGNOSIS — R300 Dysuria: Secondary | ICD-10-CM | POA: Diagnosis not present

## 2021-05-17 DIAGNOSIS — Z808 Family history of malignant neoplasm of other organs or systems: Secondary | ICD-10-CM | POA: Diagnosis not present

## 2021-05-17 DIAGNOSIS — M25561 Pain in right knee: Secondary | ICD-10-CM | POA: Diagnosis not present

## 2021-05-17 DIAGNOSIS — Z87891 Personal history of nicotine dependence: Secondary | ICD-10-CM | POA: Diagnosis not present

## 2021-05-17 DIAGNOSIS — R000 Tachycardia, unspecified: Secondary | ICD-10-CM | POA: Diagnosis not present

## 2021-05-17 DIAGNOSIS — Z79899 Other long term (current) drug therapy: Secondary | ICD-10-CM | POA: Diagnosis not present

## 2021-05-17 DIAGNOSIS — K219 Gastro-esophageal reflux disease without esophagitis: Secondary | ICD-10-CM | POA: Diagnosis not present

## 2021-05-18 DIAGNOSIS — C50811 Malignant neoplasm of overlapping sites of right female breast: Secondary | ICD-10-CM | POA: Diagnosis not present

## 2021-05-18 DIAGNOSIS — E119 Type 2 diabetes mellitus without complications: Secondary | ICD-10-CM | POA: Diagnosis not present

## 2021-05-18 DIAGNOSIS — Z803 Family history of malignant neoplasm of breast: Secondary | ICD-10-CM | POA: Diagnosis not present

## 2021-05-18 DIAGNOSIS — M542 Cervicalgia: Secondary | ICD-10-CM | POA: Diagnosis not present

## 2021-05-18 DIAGNOSIS — Z9221 Personal history of antineoplastic chemotherapy: Secondary | ICD-10-CM | POA: Diagnosis not present

## 2021-05-18 DIAGNOSIS — Z7901 Long term (current) use of anticoagulants: Secondary | ICD-10-CM | POA: Diagnosis not present

## 2021-05-18 DIAGNOSIS — Z9049 Acquired absence of other specified parts of digestive tract: Secondary | ICD-10-CM | POA: Diagnosis not present

## 2021-05-18 DIAGNOSIS — Z808 Family history of malignant neoplasm of other organs or systems: Secondary | ICD-10-CM | POA: Diagnosis not present

## 2021-05-18 DIAGNOSIS — Z87891 Personal history of nicotine dependence: Secondary | ICD-10-CM | POA: Diagnosis not present

## 2021-05-18 DIAGNOSIS — Z79899 Other long term (current) drug therapy: Secondary | ICD-10-CM | POA: Diagnosis not present

## 2021-05-18 DIAGNOSIS — Z7984 Long term (current) use of oral hypoglycemic drugs: Secondary | ICD-10-CM | POA: Diagnosis not present

## 2021-05-18 DIAGNOSIS — K219 Gastro-esophageal reflux disease without esophagitis: Secondary | ICD-10-CM | POA: Diagnosis not present

## 2021-05-18 DIAGNOSIS — R519 Headache, unspecified: Secondary | ICD-10-CM | POA: Diagnosis not present

## 2021-05-18 DIAGNOSIS — E785 Hyperlipidemia, unspecified: Secondary | ICD-10-CM | POA: Diagnosis not present

## 2021-05-18 DIAGNOSIS — I10 Essential (primary) hypertension: Secondary | ICD-10-CM | POA: Diagnosis not present

## 2021-05-19 DIAGNOSIS — Z9049 Acquired absence of other specified parts of digestive tract: Secondary | ICD-10-CM | POA: Diagnosis not present

## 2021-05-19 DIAGNOSIS — Z7984 Long term (current) use of oral hypoglycemic drugs: Secondary | ICD-10-CM | POA: Diagnosis not present

## 2021-05-19 DIAGNOSIS — K219 Gastro-esophageal reflux disease without esophagitis: Secondary | ICD-10-CM | POA: Diagnosis not present

## 2021-05-19 DIAGNOSIS — Z9221 Personal history of antineoplastic chemotherapy: Secondary | ICD-10-CM | POA: Diagnosis not present

## 2021-05-19 DIAGNOSIS — Z803 Family history of malignant neoplasm of breast: Secondary | ICD-10-CM | POA: Diagnosis not present

## 2021-05-19 DIAGNOSIS — C50811 Malignant neoplasm of overlapping sites of right female breast: Secondary | ICD-10-CM | POA: Diagnosis not present

## 2021-05-19 DIAGNOSIS — Z808 Family history of malignant neoplasm of other organs or systems: Secondary | ICD-10-CM | POA: Diagnosis not present

## 2021-05-19 DIAGNOSIS — Z7901 Long term (current) use of anticoagulants: Secondary | ICD-10-CM | POA: Diagnosis not present

## 2021-05-19 DIAGNOSIS — E119 Type 2 diabetes mellitus without complications: Secondary | ICD-10-CM | POA: Diagnosis not present

## 2021-05-19 DIAGNOSIS — R519 Headache, unspecified: Secondary | ICD-10-CM | POA: Diagnosis not present

## 2021-05-19 DIAGNOSIS — Z87891 Personal history of nicotine dependence: Secondary | ICD-10-CM | POA: Diagnosis not present

## 2021-05-19 DIAGNOSIS — E785 Hyperlipidemia, unspecified: Secondary | ICD-10-CM | POA: Diagnosis not present

## 2021-05-19 DIAGNOSIS — I10 Essential (primary) hypertension: Secondary | ICD-10-CM | POA: Diagnosis not present

## 2021-05-19 DIAGNOSIS — Z79899 Other long term (current) drug therapy: Secondary | ICD-10-CM | POA: Diagnosis not present

## 2021-05-19 DIAGNOSIS — M542 Cervicalgia: Secondary | ICD-10-CM | POA: Diagnosis not present

## 2021-05-20 DIAGNOSIS — R519 Headache, unspecified: Secondary | ICD-10-CM | POA: Diagnosis not present

## 2021-05-20 DIAGNOSIS — E785 Hyperlipidemia, unspecified: Secondary | ICD-10-CM | POA: Diagnosis not present

## 2021-05-20 DIAGNOSIS — Z803 Family history of malignant neoplasm of breast: Secondary | ICD-10-CM | POA: Diagnosis not present

## 2021-05-20 DIAGNOSIS — Z808 Family history of malignant neoplasm of other organs or systems: Secondary | ICD-10-CM | POA: Diagnosis not present

## 2021-05-20 DIAGNOSIS — K219 Gastro-esophageal reflux disease without esophagitis: Secondary | ICD-10-CM | POA: Diagnosis not present

## 2021-05-20 DIAGNOSIS — Z79899 Other long term (current) drug therapy: Secondary | ICD-10-CM | POA: Diagnosis not present

## 2021-05-20 DIAGNOSIS — Z7984 Long term (current) use of oral hypoglycemic drugs: Secondary | ICD-10-CM | POA: Diagnosis not present

## 2021-05-20 DIAGNOSIS — Z87891 Personal history of nicotine dependence: Secondary | ICD-10-CM | POA: Diagnosis not present

## 2021-05-20 DIAGNOSIS — E119 Type 2 diabetes mellitus without complications: Secondary | ICD-10-CM | POA: Diagnosis not present

## 2021-05-20 DIAGNOSIS — C50811 Malignant neoplasm of overlapping sites of right female breast: Secondary | ICD-10-CM | POA: Diagnosis not present

## 2021-05-20 DIAGNOSIS — Z7901 Long term (current) use of anticoagulants: Secondary | ICD-10-CM | POA: Diagnosis not present

## 2021-05-20 DIAGNOSIS — I10 Essential (primary) hypertension: Secondary | ICD-10-CM | POA: Diagnosis not present

## 2021-05-20 DIAGNOSIS — Z9049 Acquired absence of other specified parts of digestive tract: Secondary | ICD-10-CM | POA: Diagnosis not present

## 2021-05-20 DIAGNOSIS — Z9221 Personal history of antineoplastic chemotherapy: Secondary | ICD-10-CM | POA: Diagnosis not present

## 2021-05-20 DIAGNOSIS — M542 Cervicalgia: Secondary | ICD-10-CM | POA: Diagnosis not present

## 2021-05-21 DIAGNOSIS — Z9049 Acquired absence of other specified parts of digestive tract: Secondary | ICD-10-CM | POA: Diagnosis not present

## 2021-05-21 DIAGNOSIS — Z87891 Personal history of nicotine dependence: Secondary | ICD-10-CM | POA: Diagnosis not present

## 2021-05-21 DIAGNOSIS — C50811 Malignant neoplasm of overlapping sites of right female breast: Secondary | ICD-10-CM | POA: Diagnosis not present

## 2021-05-21 DIAGNOSIS — M542 Cervicalgia: Secondary | ICD-10-CM | POA: Diagnosis not present

## 2021-05-21 DIAGNOSIS — Z9221 Personal history of antineoplastic chemotherapy: Secondary | ICD-10-CM | POA: Diagnosis not present

## 2021-05-21 DIAGNOSIS — Z808 Family history of malignant neoplasm of other organs or systems: Secondary | ICD-10-CM | POA: Diagnosis not present

## 2021-05-21 DIAGNOSIS — Z79899 Other long term (current) drug therapy: Secondary | ICD-10-CM | POA: Diagnosis not present

## 2021-05-21 DIAGNOSIS — E119 Type 2 diabetes mellitus without complications: Secondary | ICD-10-CM | POA: Diagnosis not present

## 2021-05-21 DIAGNOSIS — Z7901 Long term (current) use of anticoagulants: Secondary | ICD-10-CM | POA: Diagnosis not present

## 2021-05-21 DIAGNOSIS — K219 Gastro-esophageal reflux disease without esophagitis: Secondary | ICD-10-CM | POA: Diagnosis not present

## 2021-05-21 DIAGNOSIS — Z7984 Long term (current) use of oral hypoglycemic drugs: Secondary | ICD-10-CM | POA: Diagnosis not present

## 2021-05-21 DIAGNOSIS — I10 Essential (primary) hypertension: Secondary | ICD-10-CM | POA: Diagnosis not present

## 2021-05-21 DIAGNOSIS — R519 Headache, unspecified: Secondary | ICD-10-CM | POA: Diagnosis not present

## 2021-05-21 DIAGNOSIS — Z803 Family history of malignant neoplasm of breast: Secondary | ICD-10-CM | POA: Diagnosis not present

## 2021-05-21 DIAGNOSIS — E785 Hyperlipidemia, unspecified: Secondary | ICD-10-CM | POA: Diagnosis not present

## 2021-05-25 ENCOUNTER — Encounter: Payer: Self-pay | Admitting: *Deleted

## 2021-06-11 DIAGNOSIS — I10 Essential (primary) hypertension: Secondary | ICD-10-CM | POA: Diagnosis not present

## 2021-06-11 DIAGNOSIS — E1165 Type 2 diabetes mellitus with hyperglycemia: Secondary | ICD-10-CM | POA: Diagnosis not present

## 2021-06-11 DIAGNOSIS — Z87891 Personal history of nicotine dependence: Secondary | ICD-10-CM | POA: Diagnosis not present

## 2021-06-11 DIAGNOSIS — Z299 Encounter for prophylactic measures, unspecified: Secondary | ICD-10-CM | POA: Diagnosis not present

## 2021-06-11 DIAGNOSIS — M109 Gout, unspecified: Secondary | ICD-10-CM | POA: Diagnosis not present

## 2021-06-14 DIAGNOSIS — I10 Essential (primary) hypertension: Secondary | ICD-10-CM | POA: Diagnosis not present

## 2021-06-14 DIAGNOSIS — Z299 Encounter for prophylactic measures, unspecified: Secondary | ICD-10-CM | POA: Diagnosis not present

## 2021-06-14 DIAGNOSIS — E1165 Type 2 diabetes mellitus with hyperglycemia: Secondary | ICD-10-CM | POA: Diagnosis not present

## 2021-06-14 DIAGNOSIS — E113293 Type 2 diabetes mellitus with mild nonproliferative diabetic retinopathy without macular edema, bilateral: Secondary | ICD-10-CM | POA: Diagnosis not present

## 2021-06-14 DIAGNOSIS — M109 Gout, unspecified: Secondary | ICD-10-CM | POA: Diagnosis not present

## 2021-06-16 DIAGNOSIS — E1165 Type 2 diabetes mellitus with hyperglycemia: Secondary | ICD-10-CM | POA: Diagnosis not present

## 2021-07-02 DIAGNOSIS — Z299 Encounter for prophylactic measures, unspecified: Secondary | ICD-10-CM | POA: Diagnosis not present

## 2021-07-02 DIAGNOSIS — Z23 Encounter for immunization: Secondary | ICD-10-CM | POA: Diagnosis not present

## 2021-07-02 DIAGNOSIS — L989 Disorder of the skin and subcutaneous tissue, unspecified: Secondary | ICD-10-CM | POA: Diagnosis not present

## 2021-07-02 DIAGNOSIS — I10 Essential (primary) hypertension: Secondary | ICD-10-CM | POA: Diagnosis not present

## 2021-07-09 DIAGNOSIS — Z713 Dietary counseling and surveillance: Secondary | ICD-10-CM | POA: Diagnosis not present

## 2021-07-09 DIAGNOSIS — I10 Essential (primary) hypertension: Secondary | ICD-10-CM | POA: Diagnosis not present

## 2021-07-09 DIAGNOSIS — M109 Gout, unspecified: Secondary | ICD-10-CM | POA: Diagnosis not present

## 2021-07-09 DIAGNOSIS — Z299 Encounter for prophylactic measures, unspecified: Secondary | ICD-10-CM | POA: Diagnosis not present

## 2021-07-15 DIAGNOSIS — H43813 Vitreous degeneration, bilateral: Secondary | ICD-10-CM | POA: Diagnosis not present

## 2021-07-16 DIAGNOSIS — E1165 Type 2 diabetes mellitus with hyperglycemia: Secondary | ICD-10-CM | POA: Diagnosis not present

## 2021-07-16 DIAGNOSIS — I129 Hypertensive chronic kidney disease with stage 1 through stage 4 chronic kidney disease, or unspecified chronic kidney disease: Secondary | ICD-10-CM | POA: Diagnosis not present

## 2021-07-16 DIAGNOSIS — E1122 Type 2 diabetes mellitus with diabetic chronic kidney disease: Secondary | ICD-10-CM | POA: Diagnosis not present

## 2021-07-26 DIAGNOSIS — Z170 Estrogen receptor positive status [ER+]: Secondary | ICD-10-CM | POA: Diagnosis not present

## 2021-07-26 DIAGNOSIS — Z515 Encounter for palliative care: Secondary | ICD-10-CM | POA: Diagnosis not present

## 2021-07-26 DIAGNOSIS — C50811 Malignant neoplasm of overlapping sites of right female breast: Secondary | ICD-10-CM | POA: Diagnosis not present

## 2021-07-26 DIAGNOSIS — Z791 Long term (current) use of non-steroidal anti-inflammatories (NSAID): Secondary | ICD-10-CM | POA: Diagnosis not present

## 2021-08-06 DIAGNOSIS — K219 Gastro-esophageal reflux disease without esophagitis: Secondary | ICD-10-CM | POA: Diagnosis not present

## 2021-08-06 DIAGNOSIS — Z299 Encounter for prophylactic measures, unspecified: Secondary | ICD-10-CM | POA: Diagnosis not present

## 2021-08-06 DIAGNOSIS — I10 Essential (primary) hypertension: Secondary | ICD-10-CM | POA: Diagnosis not present

## 2021-08-06 DIAGNOSIS — E1165 Type 2 diabetes mellitus with hyperglycemia: Secondary | ICD-10-CM | POA: Diagnosis not present

## 2021-08-16 DIAGNOSIS — N183 Chronic kidney disease, stage 3 unspecified: Secondary | ICD-10-CM | POA: Diagnosis not present

## 2021-08-16 DIAGNOSIS — I129 Hypertensive chronic kidney disease with stage 1 through stage 4 chronic kidney disease, or unspecified chronic kidney disease: Secondary | ICD-10-CM | POA: Diagnosis not present

## 2021-08-16 DIAGNOSIS — E1122 Type 2 diabetes mellitus with diabetic chronic kidney disease: Secondary | ICD-10-CM | POA: Diagnosis not present

## 2021-08-30 DIAGNOSIS — I10 Essential (primary) hypertension: Secondary | ICD-10-CM | POA: Diagnosis not present

## 2021-08-30 DIAGNOSIS — L570 Actinic keratosis: Secondary | ICD-10-CM | POA: Diagnosis not present

## 2021-08-30 DIAGNOSIS — R5383 Other fatigue: Secondary | ICD-10-CM | POA: Diagnosis not present

## 2021-08-30 DIAGNOSIS — E7800 Pure hypercholesterolemia, unspecified: Secondary | ICD-10-CM | POA: Diagnosis not present

## 2021-08-30 DIAGNOSIS — Z0000 Encounter for general adult medical examination without abnormal findings: Secondary | ICD-10-CM | POA: Diagnosis not present

## 2021-08-30 DIAGNOSIS — Z7189 Other specified counseling: Secondary | ICD-10-CM | POA: Diagnosis not present

## 2021-08-30 DIAGNOSIS — Z789 Other specified health status: Secondary | ICD-10-CM | POA: Diagnosis not present

## 2021-08-30 DIAGNOSIS — Z79899 Other long term (current) drug therapy: Secondary | ICD-10-CM | POA: Diagnosis not present

## 2021-08-30 DIAGNOSIS — Z299 Encounter for prophylactic measures, unspecified: Secondary | ICD-10-CM | POA: Diagnosis not present

## 2021-09-06 DIAGNOSIS — H524 Presbyopia: Secondary | ICD-10-CM | POA: Diagnosis not present

## 2021-09-06 DIAGNOSIS — H25013 Cortical age-related cataract, bilateral: Secondary | ICD-10-CM | POA: Diagnosis not present

## 2021-09-06 DIAGNOSIS — H35361 Drusen (degenerative) of macula, right eye: Secondary | ICD-10-CM | POA: Diagnosis not present

## 2021-09-06 DIAGNOSIS — H25043 Posterior subcapsular polar age-related cataract, bilateral: Secondary | ICD-10-CM | POA: Diagnosis not present

## 2021-09-06 DIAGNOSIS — H2513 Age-related nuclear cataract, bilateral: Secondary | ICD-10-CM | POA: Diagnosis not present

## 2021-09-15 DIAGNOSIS — E1165 Type 2 diabetes mellitus with hyperglycemia: Secondary | ICD-10-CM | POA: Diagnosis not present

## 2021-10-10 DIAGNOSIS — C50919 Malignant neoplasm of unspecified site of unspecified female breast: Secondary | ICD-10-CM | POA: Diagnosis not present

## 2021-10-10 DIAGNOSIS — R059 Cough, unspecified: Secondary | ICD-10-CM | POA: Diagnosis not present

## 2021-10-10 DIAGNOSIS — E785 Hyperlipidemia, unspecified: Secondary | ICD-10-CM | POA: Diagnosis not present

## 2021-10-10 DIAGNOSIS — Z87891 Personal history of nicotine dependence: Secondary | ICD-10-CM | POA: Diagnosis not present

## 2021-10-10 DIAGNOSIS — Z79899 Other long term (current) drug therapy: Secondary | ICD-10-CM | POA: Diagnosis not present

## 2021-10-10 DIAGNOSIS — U071 COVID-19: Secondary | ICD-10-CM | POA: Diagnosis not present

## 2021-10-10 DIAGNOSIS — K219 Gastro-esophageal reflux disease without esophagitis: Secondary | ICD-10-CM | POA: Diagnosis not present

## 2021-10-10 DIAGNOSIS — E119 Type 2 diabetes mellitus without complications: Secondary | ICD-10-CM | POA: Diagnosis not present

## 2021-10-10 DIAGNOSIS — I10 Essential (primary) hypertension: Secondary | ICD-10-CM | POA: Diagnosis not present

## 2021-10-10 DIAGNOSIS — Z7984 Long term (current) use of oral hypoglycemic drugs: Secondary | ICD-10-CM | POA: Diagnosis not present

## 2021-10-13 DIAGNOSIS — D6869 Other thrombophilia: Secondary | ICD-10-CM | POA: Diagnosis not present

## 2021-10-13 DIAGNOSIS — Z87891 Personal history of nicotine dependence: Secondary | ICD-10-CM | POA: Diagnosis not present

## 2021-10-13 DIAGNOSIS — Z299 Encounter for prophylactic measures, unspecified: Secondary | ICD-10-CM | POA: Diagnosis not present

## 2021-10-13 DIAGNOSIS — N1831 Chronic kidney disease, stage 3a: Secondary | ICD-10-CM | POA: Diagnosis not present

## 2021-10-13 DIAGNOSIS — U071 COVID-19: Secondary | ICD-10-CM | POA: Diagnosis not present

## 2021-10-15 DIAGNOSIS — N183 Chronic kidney disease, stage 3 unspecified: Secondary | ICD-10-CM | POA: Diagnosis not present

## 2021-10-15 DIAGNOSIS — E1122 Type 2 diabetes mellitus with diabetic chronic kidney disease: Secondary | ICD-10-CM | POA: Diagnosis not present

## 2021-10-22 DIAGNOSIS — G620 Drug-induced polyneuropathy: Secondary | ICD-10-CM | POA: Diagnosis not present

## 2021-10-22 DIAGNOSIS — T451X5A Adverse effect of antineoplastic and immunosuppressive drugs, initial encounter: Secondary | ICD-10-CM | POA: Diagnosis not present

## 2021-10-22 DIAGNOSIS — C50911 Malignant neoplasm of unspecified site of right female breast: Secondary | ICD-10-CM | POA: Diagnosis not present

## 2021-10-22 DIAGNOSIS — E1165 Type 2 diabetes mellitus with hyperglycemia: Secondary | ICD-10-CM | POA: Diagnosis not present

## 2021-10-22 DIAGNOSIS — Z299 Encounter for prophylactic measures, unspecified: Secondary | ICD-10-CM | POA: Diagnosis not present

## 2021-10-22 DIAGNOSIS — I10 Essential (primary) hypertension: Secondary | ICD-10-CM | POA: Diagnosis not present

## 2021-11-14 DIAGNOSIS — E1165 Type 2 diabetes mellitus with hyperglycemia: Secondary | ICD-10-CM | POA: Diagnosis not present

## 2021-11-18 DIAGNOSIS — N390 Urinary tract infection, site not specified: Secondary | ICD-10-CM | POA: Diagnosis not present

## 2021-11-18 DIAGNOSIS — Z789 Other specified health status: Secondary | ICD-10-CM | POA: Diagnosis not present

## 2021-11-18 DIAGNOSIS — R350 Frequency of micturition: Secondary | ICD-10-CM | POA: Diagnosis not present

## 2021-11-18 DIAGNOSIS — I10 Essential (primary) hypertension: Secondary | ICD-10-CM | POA: Diagnosis not present

## 2021-11-20 DIAGNOSIS — K760 Fatty (change of) liver, not elsewhere classified: Secondary | ICD-10-CM | POA: Diagnosis not present

## 2021-11-20 DIAGNOSIS — R059 Cough, unspecified: Secondary | ICD-10-CM | POA: Diagnosis not present

## 2021-11-20 DIAGNOSIS — J441 Chronic obstructive pulmonary disease with (acute) exacerbation: Secondary | ICD-10-CM | POA: Diagnosis not present

## 2021-11-20 DIAGNOSIS — Z853 Personal history of malignant neoplasm of breast: Secondary | ICD-10-CM | POA: Diagnosis not present

## 2021-11-20 DIAGNOSIS — Z87891 Personal history of nicotine dependence: Secondary | ICD-10-CM | POA: Diagnosis not present

## 2021-11-20 DIAGNOSIS — Z20822 Contact with and (suspected) exposure to covid-19: Secondary | ICD-10-CM | POA: Diagnosis not present

## 2021-11-20 DIAGNOSIS — E119 Type 2 diabetes mellitus without complications: Secondary | ICD-10-CM | POA: Diagnosis not present

## 2021-11-20 DIAGNOSIS — R062 Wheezing: Secondary | ICD-10-CM | POA: Diagnosis not present

## 2021-11-20 DIAGNOSIS — K449 Diaphragmatic hernia without obstruction or gangrene: Secondary | ICD-10-CM | POA: Diagnosis not present

## 2021-11-20 DIAGNOSIS — R600 Localized edema: Secondary | ICD-10-CM | POA: Diagnosis not present

## 2021-11-20 DIAGNOSIS — K219 Gastro-esophageal reflux disease without esophagitis: Secondary | ICD-10-CM | POA: Diagnosis not present

## 2021-11-20 DIAGNOSIS — I10 Essential (primary) hypertension: Secondary | ICD-10-CM | POA: Diagnosis not present

## 2021-11-20 DIAGNOSIS — R079 Chest pain, unspecified: Secondary | ICD-10-CM | POA: Diagnosis not present

## 2021-11-20 DIAGNOSIS — N390 Urinary tract infection, site not specified: Secondary | ICD-10-CM | POA: Diagnosis not present

## 2021-11-20 DIAGNOSIS — E785 Hyperlipidemia, unspecified: Secondary | ICD-10-CM | POA: Diagnosis not present

## 2021-11-20 DIAGNOSIS — I517 Cardiomegaly: Secondary | ICD-10-CM | POA: Diagnosis not present

## 2021-11-20 DIAGNOSIS — Z79899 Other long term (current) drug therapy: Secondary | ICD-10-CM | POA: Diagnosis not present

## 2021-11-20 DIAGNOSIS — Z7984 Long term (current) use of oral hypoglycemic drugs: Secondary | ICD-10-CM | POA: Diagnosis not present

## 2021-11-26 DIAGNOSIS — J440 Chronic obstructive pulmonary disease with acute lower respiratory infection: Secondary | ICD-10-CM | POA: Diagnosis not present

## 2021-11-26 DIAGNOSIS — Z299 Encounter for prophylactic measures, unspecified: Secondary | ICD-10-CM | POA: Diagnosis not present

## 2021-11-26 DIAGNOSIS — D6869 Other thrombophilia: Secondary | ICD-10-CM | POA: Diagnosis not present

## 2021-11-26 DIAGNOSIS — N1831 Chronic kidney disease, stage 3a: Secondary | ICD-10-CM | POA: Diagnosis not present

## 2021-11-26 DIAGNOSIS — Z87891 Personal history of nicotine dependence: Secondary | ICD-10-CM | POA: Diagnosis not present

## 2021-11-26 DIAGNOSIS — J209 Acute bronchitis, unspecified: Secondary | ICD-10-CM | POA: Diagnosis not present

## 2021-11-26 DIAGNOSIS — I10 Essential (primary) hypertension: Secondary | ICD-10-CM | POA: Diagnosis not present

## 2021-12-09 DIAGNOSIS — E1140 Type 2 diabetes mellitus with diabetic neuropathy, unspecified: Secondary | ICD-10-CM | POA: Diagnosis not present

## 2021-12-09 DIAGNOSIS — I10 Essential (primary) hypertension: Secondary | ICD-10-CM | POA: Diagnosis not present

## 2021-12-09 DIAGNOSIS — D692 Other nonthrombocytopenic purpura: Secondary | ICD-10-CM | POA: Diagnosis not present

## 2021-12-09 DIAGNOSIS — Z299 Encounter for prophylactic measures, unspecified: Secondary | ICD-10-CM | POA: Diagnosis not present

## 2021-12-10 ENCOUNTER — Other Ambulatory Visit: Payer: Self-pay

## 2021-12-10 ENCOUNTER — Ambulatory Visit (INDEPENDENT_AMBULATORY_CARE_PROVIDER_SITE_OTHER): Payer: Medicare Other | Admitting: Internal Medicine

## 2021-12-10 ENCOUNTER — Encounter: Payer: Self-pay | Admitting: Internal Medicine

## 2021-12-10 VITALS — BP 130/62 | HR 91 | Ht 61.0 in | Wt 260.6 lb

## 2021-12-10 DIAGNOSIS — F17200 Nicotine dependence, unspecified, uncomplicated: Secondary | ICD-10-CM

## 2021-12-10 DIAGNOSIS — K219 Gastro-esophageal reflux disease without esophagitis: Secondary | ICD-10-CM | POA: Diagnosis not present

## 2021-12-10 DIAGNOSIS — G4733 Obstructive sleep apnea (adult) (pediatric): Secondary | ICD-10-CM

## 2021-12-10 DIAGNOSIS — R0602 Shortness of breath: Secondary | ICD-10-CM

## 2021-12-10 NOTE — Progress Notes (Signed)
[facility]    [id number]    [birth year]  Primary Care Physician:Vyas, Costella Hatcher, MD  Referring Physician: Glenda Chroman, MD [address] [place],  [place] [address] Reason for Consultation: shortness of breath Date of Consultation: 12/10/2021  Chief complaint:   Chief Complaint  Patient presents with   Consult    Copd      HPI: Beth Phelps is a 71 y.o. woman with history of tobacco use disorder and breast cancer s/p chemo and radiation (Jan 2022.) she aborted radiation therapy due to side effects.  Breathing getting worse over the last couple of years  She is now having episodes of coughing with thick white/yellow mucus. Had blood mixed with mucus twice last week but this has gone away.  Cough is usually at night when she lays down. She ends up sleeping in a recliner. She has acid reflux and takes nexium 40 mg daily. Breathing treatment do help her cough as well as shortness of breath. Usually takes albuterol 1-2 times/month.   She also has shortness of breath with minimal exertion.  PCP set her up with nebulizer machine and breathing treatments   She has OSA and does not wear CPAP. Hasn't worn a CPAP in 15 years and would like to get back on. Asking about inspire. She is a Psychiatrist.  Snores loudly. Has excessive daytime sleepiness and can't stay awake during the day if she tries. Can't sleep more than 2 hours at a time due to snoring and apneas.     Social history:  Occupation: used to work in [employer].  Exposures: lives at home with son who is 16.  Smoking history: smoked as a teenager - 1 ppd x 20 years. 20 pack years.   Social History   Occupational History   Not on file  Tobacco Use   Smoking status: Former    Packs/day: 1.00    Years: 20.00    Pack years: 20.00    Types: Cigarettes    Quit date: 07/02/1994    Years since quitting: 27.4   Smokeless tobacco: Never  Vaping Use   Vaping Use: Never used  Substance and Sexual Activity   Alcohol  use: No   Drug use: No   Sexual activity: Never    Birth control/protection: Surgical    Relevant family history:  Family History  Problem Relation Age of Onset   Diabetes Father    Colon polyps Father    Heart disease Neg Hx    Colon cancer Neg Hx     Past Medical History:  Diagnosis Date   Anxiety    Breast cancer ([place])    Chronic pain    Diabetes mellitus    type 2   Fatty liver    GERD (gastroesophageal reflux disease)    Herpes    Hiatal hernia 11/15/2004   small   History of asthma    History of sleep apnea    HTN (hypertension)    Hyperlipidemia    Migraine    Obesity    Sleep apnea    no CPAP   Stage 3 chronic kidney disease ([facility])    Type 2 diabetes mellitus ([place])     Past Surgical History:  Procedure Laterality Date   ABDOMINAL HYSTERECTOMY     BLADDER SURGERY     CHOLECYSTECTOMY     COLONOSCOPY  09/2012   Dr. Anthony Sar: normal. H/O prior polyps.   COLONOSCOPY N/A  07/02/2014   Procedure: COLONOSCOPY;  Surgeon: Daneil Dolin, MD;  [facility];  Service: Endoscopy;  Laterality: N/A;  7:30   ESOPHAGOGASTRODUODENOSCOPY  11/15/2004   Dr. Laural Golden- small sliding hiatal hernia with mild changes of reflux esophagitis   MOLE REMOVAL     malignant   PORT-A-CATH REMOVAL Right 04/07/2021   Procedure: REMOVAL PORT-A-CATH;  Surgeon: Erroll Luna, MD;  Location: [facility];  Service: General;  Laterality: Right;   PORTACATH PLACEMENT N/A 11/10/2020   Procedure: INSERTION PORT-A-CATH WITH ULTRASOUND GUIDANCE;  Surgeon: Erroll Luna, MD;  Location: [facility];  Service: General;  Laterality: N/A;     Physical Exam: Blood pressure 130/62, pulse 91, height 5\' 1"  (1.549 m), weight 260 lb 9.6 oz (118.2 kg), SpO2 97 %. Gen:      No acute distress, obese ENT:  +cobblestoning in oropharynx, mallampati IV, no nasal polyps, mucus membranes moist Lungs:    No increased respiratory effort, symmetric chest wall excursion, clear to auscultation bilaterally, no wheezes or  crackles CV:         Regular rate and rhythm; no murmurs, rubs, or gallops.  No pedal edema Abd:      + bowel sounds; soft, non-tender; no distension MSK: no acute synovitis of DIP or PIP joints, no mechanics hands.  Skin:      Warm and dry; no rashes Neuro: normal speech, no focal facial asymmetry Psych: alert and oriented x3, normal mood and affect   Data Reviewed/Medical Decision Making:  Independent interpretation of tests: Imaging:  Review of patient's chest xray Jan 2022 images revealed no acute process and central line placement. . The patient's images have been independently reviewed by me.    C Chest March 2021 done at [facility], negative for PE, enlarged PA. No emphysema. Sub 4 mm nodules.  PFTs:  No flowsheet data found.  Labs: Lab Results  Component Value Date   WBC 11.4 (H) 11/10/2020   HGB 14.2 11/10/2020   HCT 46.0 11/10/2020   MCV 84.4 11/10/2020   PLT 300 11/10/2020   Lab Results  Component Value Date   NA 138 11/10/2020   K 4.6 11/10/2020   CL 100 11/10/2020   CO2 25 11/10/2020     Immunization status:  Immunization History  Administered Date(s) Administered   Fluad Quad(high Dose 65+) 07/30/2020   Influenza Inj Mdck Quad With Preservative 07/09/2018   Influenza Split 12/15/2006, 09/03/2009, 07/25/2012, 07/17/2013, 07/22/2014, 07/19/2016, 07/29/2019   Moderna Sars-Covid-2 Vaccination 03/12/2020   Pneumococcal Conjugate-13 01/02/2019   Pneumococcal Polysaccharide-23 12/16/2006   Tdap 01/04/2019     I reviewed prior external note(s) from ED, GI, family medicine  I reviewed the result(s) of the labs and imaging as noted above.   I have ordered pft, echo, sleep study  Assessment:  Untreated OSA Shortness of breath  Tobacco use disorder Chronic cough - likely GERD History of breast cancer s/p chemorads.  Plan/Recommendations:  Echocardiogram. - we will call to schedule Full set of PFTs to evaluate dyspnea  We will call you to schedule a  home sleep apnea test. There is a wait on this right now of several weeks.  Continue albuterol inhaler or nebulizer as needed.   Continue acid reflux medicine. Try sleeping with a wedge pillow at night to help with cough at night.  She is outside the window for lung cancer screening.  Return to Care: Return in about 2 months (around 02/07/2022).  Lenice Llamas, MD Pulmonary and [facility] [phone]  CC: Glenda Chroman, MD

## 2021-12-10 NOTE — Patient Instructions (Addendum)
Please schedule follow up scheduled with myself in 2 months.  If my schedule is not open yet, we will contact you with a reminder closer to that time. Please call [phone] if you haven't heard from [place] a month before.   Before your next visit I would like you to have: Echocardiogram. - we will call to schedule Full set of PFTs - 1 hour.  We will call you to schedule a home sleep apnea test. There is a wait on this right now.   Continue albuterol inhaler or nebulizer as needed.   Continue acid reflux medicine. Try sleeping with a wedge pillow at night to help with cough at night.  What is GERD? Gastroesophageal reflux disease (GERD) is gastroesophageal reflux diseasewhich occurs when the lower esophageal sphincter (LES) opens spontaneously, for varying periods of time, or does not close properly and stomach contents rise up into the esophagus. GER is also called acid reflux or acid regurgitation, because digestive juices--called acids--rise up with the food. The esophagus is the tube that carries food from the mouth to the stomach. The LES is a ring of muscle at the bottom of the esophagus that acts like a valve between the esophagus and stomach.  When acid reflux occurs, food or fluid can be tasted in the back of the mouth. When refluxed stomach acid touches the lining of the esophagus it may cause a burning sensation in the chest or throat called heartburn or acid indigestion. Occasional reflux is common. Persistent reflux that occurs more than twice a week is considered GERD, and it can eventually lead to more serious health problems. People of all ages can have GERD. Studies have shown that GERD may worsen or contribute to asthma, chronic cough, and pulmonary fibrosis.   What are the symptoms of GERD? The main symptom of GERD in adults is frequent heartburn, also called acid indigestion--burning-type pain in the lower part of the mid-chest, behind the breast bone, and in the mid-abdomen.   Not all reflux is acidic in nature, and many patients don't have heart burn at all. Sometimes it feels like a cough (either dry or with mucus), choking sensation, asthma, shortness of breath, waking up at night, frequent throat clearing, or trouble swallowing.    What causes GERD? The reason some people develop GERD is still unclear. However, research shows that in people with GERD, the LES relaxes while the rest of the esophagus is working. Anatomical abnormalities such as a hiatal hernia may also contribute to GERD. A hiatal hernia occurs when the upper part of the stomach and the LES move above the diaphragm, the muscle wall that separates the stomach from the chest. Normally, the diaphragm helps the LES keep acid from rising up into the esophagus. When a hiatal hernia is present, acid reflux can occur more easily. A hiatal hernia can occur in people of any age and is most often a normal finding in otherwise healthy people over age 72. Most of the time, a hiatal hernia produces no symptoms.   Other factors that may contribute to GERD include - Obesity or recent weight gain - Pregnancy  - Smoking  - Diet - Certain medications  Common foods that can worsen reflux symptoms include: - carbonated beverages - artificial sweeteners - citrus fruits  - chocolate  - drinks with caffeine or alcohol  - fatty and fried foods  - garlic and onions  - mint flavorings  - spicy foods  - tomato-based foods, like spaghetti sauce,  salsa, chili, and pizza   Lifestyle Changes If you smoke, stop.  Avoid foods and beverages that worsen symptoms (see above.) Lose weight if needed.  Eat small, frequent meals.  Wear loose-fitting clothes.  Avoid lying down for 3 hours after a meal.  Raise the head of your bed 6 to 8 inches by securing wood blocks under the bedposts. Just using extra pillows will not help, but using a wedge-shaped pillow may be helpful.  Medications  H2 blockers, such as cimetidine  (Tagamet HB), famotidine (Pepcid AC), nizatidine (Axid AR), and ranitidine (Zantac 75), decrease acid production. They are available in prescription strength and over-the-counter strength. These drugs provide short-term relief and are effective for about half of those who have GERD symptoms.  Proton pump inhibitors include omeprazole (Prilosec, Zegerid), lansoprazole (Prevacid), pantoprazole (Protonix), rabeprazole (Aciphex), and esomeprazole (Nexium), which are available by prescription. Prilosec is also available in over-the-counter strength. Proton pump inhibitors are more effective than H2 blockers and can relieve symptoms and heal the esophageal lining in almost everyone who has GERD.  Because drugs work in different ways, combinations of medications may help control symptoms. People who get heartburn after eating may take both antacids and H2 blockers. The antacids work first to neutralize the acid in the stomach, and then the H2 blockers act on acid production. By the time the antacid stops working, the H2 blocker will have stopped acid production. Your health care provider is the best source of information about how to use medications for GERD.   Points to Remember 1. You can have GERD without having heartburn. Your symptoms could include a dry cough, asthma symptoms, or trouble swallowing.  2. Taking medications daily as prescribed is important in controlling you symptoms.  Sometimes it can take up to 8 weeks to fully achieve the effects of the medications prescribed.  3. Coughing related to GERD can be difficult to treat and is very frustrating!  However, it is important to stick with these medications and lifestyle modifications before pursuing more aggressive or invasive test and treatments.

## 2021-12-14 DIAGNOSIS — E1165 Type 2 diabetes mellitus with hyperglycemia: Secondary | ICD-10-CM | POA: Diagnosis not present

## 2021-12-16 ENCOUNTER — Ambulatory Visit (INDEPENDENT_AMBULATORY_CARE_PROVIDER_SITE_OTHER): Payer: Medicare Other

## 2021-12-16 DIAGNOSIS — R0602 Shortness of breath: Secondary | ICD-10-CM | POA: Diagnosis not present

## 2021-12-16 LAB — ECHOCARDIOGRAM COMPLETE
AR max vel: 2.53 cm2
AV Area VTI: 2.42 cm2
AV Area mean vel: 2.48 cm2
AV Mean grad: 5 mmHg
AV Peak grad: 9.4 mmHg
Ao pk vel: 1.53 m/s
Area-P 1/2: 4.29 cm2
S' Lateral: 2.55 cm
Single Plane A4C EF: 62 %

## 2021-12-21 DIAGNOSIS — Z299 Encounter for prophylactic measures, unspecified: Secondary | ICD-10-CM | POA: Diagnosis not present

## 2021-12-21 DIAGNOSIS — I10 Essential (primary) hypertension: Secondary | ICD-10-CM | POA: Diagnosis not present

## 2021-12-21 DIAGNOSIS — L570 Actinic keratosis: Secondary | ICD-10-CM | POA: Diagnosis not present

## 2021-12-21 DIAGNOSIS — Z87891 Personal history of nicotine dependence: Secondary | ICD-10-CM | POA: Diagnosis not present

## 2022-01-06 DIAGNOSIS — Z299 Encounter for prophylactic measures, unspecified: Secondary | ICD-10-CM | POA: Diagnosis not present

## 2022-01-06 DIAGNOSIS — Z87891 Personal history of nicotine dependence: Secondary | ICD-10-CM | POA: Diagnosis not present

## 2022-01-06 DIAGNOSIS — L259 Unspecified contact dermatitis, unspecified cause: Secondary | ICD-10-CM | POA: Diagnosis not present

## 2022-01-06 DIAGNOSIS — I10 Essential (primary) hypertension: Secondary | ICD-10-CM | POA: Diagnosis not present

## 2022-01-13 DIAGNOSIS — E1165 Type 2 diabetes mellitus with hyperglycemia: Secondary | ICD-10-CM | POA: Diagnosis not present

## 2022-01-13 DIAGNOSIS — E785 Hyperlipidemia, unspecified: Secondary | ICD-10-CM | POA: Diagnosis not present

## 2022-01-13 DIAGNOSIS — I10 Essential (primary) hypertension: Secondary | ICD-10-CM | POA: Diagnosis not present

## 2022-01-28 ENCOUNTER — Ambulatory Visit: Payer: Medicare Other

## 2022-01-28 DIAGNOSIS — G4733 Obstructive sleep apnea (adult) (pediatric): Secondary | ICD-10-CM | POA: Diagnosis not present

## 2022-02-02 DIAGNOSIS — Z713 Dietary counseling and surveillance: Secondary | ICD-10-CM | POA: Diagnosis not present

## 2022-02-02 DIAGNOSIS — E1165 Type 2 diabetes mellitus with hyperglycemia: Secondary | ICD-10-CM | POA: Diagnosis not present

## 2022-02-02 DIAGNOSIS — G4733 Obstructive sleep apnea (adult) (pediatric): Secondary | ICD-10-CM | POA: Diagnosis not present

## 2022-02-02 DIAGNOSIS — I10 Essential (primary) hypertension: Secondary | ICD-10-CM | POA: Diagnosis not present

## 2022-02-02 DIAGNOSIS — Z299 Encounter for prophylactic measures, unspecified: Secondary | ICD-10-CM | POA: Diagnosis not present

## 2022-02-11 ENCOUNTER — Ambulatory Visit (INDEPENDENT_AMBULATORY_CARE_PROVIDER_SITE_OTHER): Payer: Medicare Other | Admitting: Internal Medicine

## 2022-02-11 ENCOUNTER — Encounter: Payer: Self-pay | Admitting: Internal Medicine

## 2022-02-11 VITALS — BP 128/72 | HR 70 | Temp 97.9°F | Ht 61.0 in | Wt 262.2 lb

## 2022-02-11 DIAGNOSIS — R0602 Shortness of breath: Secondary | ICD-10-CM | POA: Diagnosis not present

## 2022-02-11 DIAGNOSIS — G4733 Obstructive sleep apnea (adult) (pediatric): Secondary | ICD-10-CM

## 2022-02-11 LAB — PULMONARY FUNCTION TEST
DL/VA % pred: 94 %
DL/VA: 3.97 ml/min/mmHg/L
DLCO cor % pred: 88 %
DLCO cor: 15.51 ml/min/mmHg
DLCO unc % pred: 88 %
DLCO unc: 15.51 ml/min/mmHg
FEF 25-75 Post: 2.69 L/sec
FEF 25-75 Pre: 1.81 L/sec
FEF2575-%Change-Post: 48 %
FEF2575-%Pred-Post: 159 %
FEF2575-%Pred-Pre: 107 %
FEV1-%Change-Post: 11 %
FEV1-%Pred-Post: 100 %
FEV1-%Pred-Pre: 89 %
FEV1-Post: 1.96 L
FEV1-Pre: 1.75 L
FEV1FVC-%Change-Post: 5 %
FEV1FVC-%Pred-Pre: 107 %
FEV6-%Change-Post: 6 %
FEV6-%Pred-Post: 92 %
FEV6-%Pred-Pre: 86 %
FEV6-Post: 2.29 L
FEV6-Pre: 2.15 L
FEV6FVC-%Pred-Post: 104 %
FEV6FVC-%Pred-Pre: 104 %
FVC-%Change-Post: 6 %
FVC-%Pred-Post: 88 %
FVC-%Pred-Pre: 82 %
FVC-Post: 2.29 L
FVC-Pre: 2.15 L
Post FEV1/FVC ratio: 86 %
Post FEV6/FVC ratio: 100 %
Pre FEV1/FVC ratio: 81 %
Pre FEV6/FVC Ratio: 100 %
RV % pred: 88 %
RV: 1.81 L
TLC % pred: 92 %
TLC: 4.25 L

## 2022-02-11 NOTE — Patient Instructions (Addendum)
Please schedule follow up scheduled with myself in 4 months.  If my schedule is not open yet, we will contact you with a reminder closer to that time. Please call [phone] if you haven't heard from [place] a month before.  ? ?I will prescribe CPAP for you. There might be a wait until you get this machine.  ?I need to see you between 31-90 days of using the CPAP machine.  ?Please call and reschedule your appointment if don't get the CPAP in time.  ? ?Your goal weight to get an inspire device would be 170 lbs for a BMI<32 ? ?

## 2022-02-11 NOTE — Progress Notes (Signed)
PFT done today. 

## 2022-02-11 NOTE — Progress Notes (Signed)
? ?      ?Beth Phelps    [id number]    September 14, 1951 ? ?Primary Care Physician:Vyas, Costella Hatcher, MD ?Date of Appointment: 02/11/2022 ?Established Patient Visit ? ?Chief complaint:   ?Chief Complaint  ?Patient presents with  ? Follow-up  ?  PFT performed today.  Pt states she has been doing okay since last visit. States her breathing is doing okay.  ? ? ? ?HPI: ?Beth Phelps is a 71 y.o. woman with history of tobacco use disorder and breast cancer s/p chemotherapy and radiation.  ? ?Interval Updates: ?Here for follow up for shortness of breath after PFTs. Still feeling tired.  ?Minimal albuterol use.  ?No hospitalizations or ED visits.  ?No cough or wheezing. ?Still interested in inspire device ? have reviewed the patient's family social and past medical history and updated as appropriate.  ? ?Past Medical History:  ?Diagnosis Date  ? Anxiety   ? Breast cancer ([place])   ? Chronic pain   ? Diabetes mellitus   ? type 2  ? Fatty liver   ? GERD (gastroesophageal reflux disease)   ? Herpes   ? Hiatal hernia 11/15/2004  ? small  ? History of asthma   ? History of sleep apnea   ? HTN (hypertension)   ? Hyperlipidemia   ? Migraine   ? Obesity   ? Sleep apnea   ? no CPAP  ? Stage 3 chronic kidney disease ([place])   ? Type 2 diabetes mellitus ([place])   ? ? ?Past Surgical History:  ?Procedure Laterality Date  ? ABDOMINAL HYSTERECTOMY    ? BLADDER SURGERY    ? CHOLECYSTECTOMY    ? COLONOSCOPY  09/2012  ? Dr. Anthony Sar: normal. H/O prior polyps.  ? COLONOSCOPY N/A 07/02/2014  ? Procedure: COLONOSCOPY;  Surgeon: Daneil Dolin, MD;  [facility];  Service: Endoscopy;  Laterality: N/A;  7:30  ? ESOPHAGOGASTRODUODENOSCOPY  11/15/2004  ? Dr. Laural Golden- small sliding hiatal hernia with mild changes of reflux esophagitis  ? MOLE REMOVAL    ? malignant  ? PORT-A-CATH REMOVAL Right 04/07/2021  ? Procedure: REMOVAL PORT-A-CATH;  Surgeon: Erroll Luna, MD;  Location: [facility];  Service: General;  Laterality: Right;  ? PORTACATH PLACEMENT  N/A 11/10/2020  ? Procedure: INSERTION PORT-A-CATH WITH ULTRASOUND GUIDANCE;  Surgeon: Erroll Luna, MD;  Location: [facility];  Service: General;  Laterality: N/A;  ? ? ?Family History  ?Problem Relation Age of Onset  ? Diabetes Father   ? Colon polyps Father   ? Heart disease Neg Hx   ? Colon cancer Neg Hx   ? ? ?Social History  ? ?Occupational History  ? Not on file  ?Tobacco Use  ? Smoking status: Former  ?  Packs/day: 1.00  ?  Years: 20.00  ?  Pack years: 20.00  ?  Types: Cigarettes  ?  Quit date: 07/02/1994  ?  Years since quitting: 27.6  ? Smokeless tobacco: Never  ?Vaping Use  ? Vaping Use: Never used  ?Substance and Sexual Activity  ? Alcohol use: No  ? Drug use: No  ? Sexual activity: Never  ?  Birth control/protection: Surgical  ? ? ? ?Physical Exam: ?Blood pressure 128/72, pulse 70, temperature 97.9 ?F (36.6 ?C), temperature source Oral, height '5\' 1"'$  (1.549 m), weight 262 lb 3.2 oz (118.9 kg), SpO2 98 %. ? ?Gen:      No acute distress ?ENT:  mallampati IV, no nasal polyps, mucus membranes moist ?Lungs:  Dimnished, No increased respiratory effort, symmetric chest wall excursion, clear to auscultation bilaterally, no wheezes or crackles ?CV:         Regular rate and rhythm; no murmurs, rubs, or gallops.  Trace pedal edema ? ? ?Data Reviewed: ?Imaging: ?I have personally reviewed the  ? ?PFTs: ? ? ?  Latest Ref Rng & Units 02/11/2022  ?  9:38 AM  ?PFT Results  ?FVC-Pre L 2.15  P  ?FVC-Predicted Pre % 82  P  ?FVC-Post L 2.29  P  ?FVC-Predicted Post % 88  P  ?Pre FEV1/FVC % % 81  P  ?Post FEV1/FCV % % 86  P  ?FEV1-Pre L 1.75  P  ?FEV1-Predicted Pre % 89  P  ?FEV1-Post L 1.96  P  ?DLCO uncorrected ml/min/mmHg 15.51  P  ?DLCO UNC% % 88  P  ?DLCO corrected ml/min/mmHg 15.51  P  ?DLCO COR %Predicted % 88  P  ?DLVA Predicted % 94  P  ?TLC L 4.25  P  ?TLC % Predicted % 92  P  ?RV % Predicted % 88  P  ?  ?P Preliminary result  ? ?I have personally reviewed the patient's PFTs and normal PFT.  ? ?HSAT shows AHI 16.1  events/hour ? ?Labs: ?Lab Results  ?Component Value Date  ? WBC 11.4 (H) 11/10/2020  ? HGB 14.2 11/10/2020  ? HCT 46.0 11/10/2020  ? MCV 84.4 11/10/2020  ? PLT 300 11/10/2020  ? ? ?Lab Results  ?Component Value Date  ? NA 138 11/10/2020  ? K 4.6 11/10/2020  ? CL 100 11/10/2020  ? CO2 25 11/10/2020  ? ? ? ?Immunization status: ?Immunization History  ?Administered Date(s) Administered  ? Fluad Quad(high Dose 65+) 07/30/2020  ? Influenza Inj Mdck Quad With Preservative 07/09/2018  ? Influenza Split 12/15/2006, 09/03/2009, 07/25/2012, 07/17/2013, 07/22/2014, 07/19/2016, 07/29/2019  ? Influenza, High Dose Seasonal PF 07/17/2021  ? Moderna Sars-Covid-2 Vaccination 03/12/2020  ? Pneumococcal Conjugate-13 01/02/2019  ? Pneumococcal Polysaccharide-23 12/16/2006  ? Tdap 01/04/2019  ? ? ?External Records Personally Reviewed: sleep study ? ?Assessment:  ?OSA untreated ?Shortness of breath ? ? ?Plan/Recommendations: ? ?Start autoCPAP.  ?Follow up 31-90 days after using machine ? ? ?Return to Care: ?Return in about 4 months (around 06/13/2022). ? ? ?Lenice Llamas, MD ?Pulmonary and Critical Care Medicine ?[place] ?Office:[phone] ? ? ? ? ? ?

## 2022-02-13 DIAGNOSIS — E1165 Type 2 diabetes mellitus with hyperglycemia: Secondary | ICD-10-CM | POA: Diagnosis not present

## 2022-02-21 ENCOUNTER — Telehealth: Payer: Self-pay | Admitting: Internal Medicine

## 2022-02-22 NOTE — Telephone Encounter (Signed)
Called the pt and there was no answer- LMTCB    

## 2022-02-23 DIAGNOSIS — M25561 Pain in right knee: Secondary | ICD-10-CM | POA: Diagnosis not present

## 2022-02-23 DIAGNOSIS — I10 Essential (primary) hypertension: Secondary | ICD-10-CM | POA: Diagnosis not present

## 2022-02-23 DIAGNOSIS — Z299 Encounter for prophylactic measures, unspecified: Secondary | ICD-10-CM | POA: Diagnosis not present

## 2022-03-04 DIAGNOSIS — G4733 Obstructive sleep apnea (adult) (pediatric): Secondary | ICD-10-CM | POA: Diagnosis not present

## 2022-03-08 DIAGNOSIS — Z299 Encounter for prophylactic measures, unspecified: Secondary | ICD-10-CM | POA: Diagnosis not present

## 2022-03-08 DIAGNOSIS — N390 Urinary tract infection, site not specified: Secondary | ICD-10-CM | POA: Diagnosis not present

## 2022-03-08 DIAGNOSIS — E1165 Type 2 diabetes mellitus with hyperglycemia: Secondary | ICD-10-CM | POA: Diagnosis not present

## 2022-03-08 DIAGNOSIS — Z789 Other specified health status: Secondary | ICD-10-CM | POA: Diagnosis not present

## 2022-03-08 DIAGNOSIS — N1831 Chronic kidney disease, stage 3a: Secondary | ICD-10-CM | POA: Diagnosis not present

## 2022-03-08 DIAGNOSIS — E113293 Type 2 diabetes mellitus with mild nonproliferative diabetic retinopathy without macular edema, bilateral: Secondary | ICD-10-CM | POA: Diagnosis not present

## 2022-03-15 DIAGNOSIS — E1165 Type 2 diabetes mellitus with hyperglycemia: Secondary | ICD-10-CM | POA: Diagnosis not present

## 2022-03-27 DIAGNOSIS — Z20822 Contact with and (suspected) exposure to covid-19: Secondary | ICD-10-CM | POA: Diagnosis not present

## 2022-03-27 DIAGNOSIS — L03311 Cellulitis of abdominal wall: Secondary | ICD-10-CM | POA: Diagnosis not present

## 2022-03-27 DIAGNOSIS — I10 Essential (primary) hypertension: Secondary | ICD-10-CM | POA: Diagnosis not present

## 2022-03-27 DIAGNOSIS — R509 Fever, unspecified: Secondary | ICD-10-CM | POA: Diagnosis not present

## 2022-03-27 DIAGNOSIS — L02211 Cutaneous abscess of abdominal wall: Secondary | ICD-10-CM | POA: Diagnosis not present

## 2022-03-27 DIAGNOSIS — Z9989 Dependence on other enabling machines and devices: Secondary | ICD-10-CM | POA: Diagnosis not present

## 2022-03-27 DIAGNOSIS — Z853 Personal history of malignant neoplasm of breast: Secondary | ICD-10-CM | POA: Diagnosis not present

## 2022-03-27 DIAGNOSIS — E785 Hyperlipidemia, unspecified: Secondary | ICD-10-CM | POA: Diagnosis not present

## 2022-03-27 DIAGNOSIS — K760 Fatty (change of) liver, not elsewhere classified: Secondary | ICD-10-CM | POA: Diagnosis not present

## 2022-03-27 DIAGNOSIS — N134 Hydroureter: Secondary | ICD-10-CM | POA: Diagnosis not present

## 2022-03-27 DIAGNOSIS — R8281 Pyuria: Secondary | ICD-10-CM | POA: Diagnosis not present

## 2022-03-27 DIAGNOSIS — U071 COVID-19: Secondary | ICD-10-CM | POA: Diagnosis not present

## 2022-03-27 DIAGNOSIS — Z91199 Patient's noncompliance with other medical treatment and regimen due to unspecified reason: Secondary | ICD-10-CM | POA: Diagnosis not present

## 2022-03-27 DIAGNOSIS — Z7901 Long term (current) use of anticoagulants: Secondary | ICD-10-CM | POA: Diagnosis not present

## 2022-03-27 DIAGNOSIS — G4730 Sleep apnea, unspecified: Secondary | ICD-10-CM | POA: Diagnosis not present

## 2022-03-27 DIAGNOSIS — Z7984 Long term (current) use of oral hypoglycemic drugs: Secondary | ICD-10-CM | POA: Diagnosis not present

## 2022-03-27 DIAGNOSIS — Z792 Long term (current) use of antibiotics: Secondary | ICD-10-CM | POA: Diagnosis not present

## 2022-03-27 DIAGNOSIS — E7800 Pure hypercholesterolemia, unspecified: Secondary | ICD-10-CM | POA: Diagnosis not present

## 2022-03-27 DIAGNOSIS — J449 Chronic obstructive pulmonary disease, unspecified: Secondary | ICD-10-CM | POA: Diagnosis not present

## 2022-03-27 DIAGNOSIS — E119 Type 2 diabetes mellitus without complications: Secondary | ICD-10-CM | POA: Diagnosis not present

## 2022-03-27 DIAGNOSIS — Z87891 Personal history of nicotine dependence: Secondary | ICD-10-CM | POA: Diagnosis not present

## 2022-03-28 DIAGNOSIS — R8281 Pyuria: Secondary | ICD-10-CM | POA: Insufficient documentation

## 2022-03-30 DIAGNOSIS — J440 Chronic obstructive pulmonary disease with acute lower respiratory infection: Secondary | ICD-10-CM | POA: Diagnosis not present

## 2022-03-30 DIAGNOSIS — Z299 Encounter for prophylactic measures, unspecified: Secondary | ICD-10-CM | POA: Diagnosis not present

## 2022-03-30 DIAGNOSIS — I10 Essential (primary) hypertension: Secondary | ICD-10-CM | POA: Diagnosis not present

## 2022-03-30 DIAGNOSIS — Z87891 Personal history of nicotine dependence: Secondary | ICD-10-CM | POA: Diagnosis not present

## 2022-03-30 DIAGNOSIS — L0390 Cellulitis, unspecified: Secondary | ICD-10-CM | POA: Diagnosis not present

## 2022-03-30 DIAGNOSIS — L02211 Cutaneous abscess of abdominal wall: Secondary | ICD-10-CM | POA: Diagnosis not present

## 2022-03-30 DIAGNOSIS — G620 Drug-induced polyneuropathy: Secondary | ICD-10-CM | POA: Diagnosis not present

## 2022-04-03 ENCOUNTER — Encounter (HOSPITAL_COMMUNITY): Payer: Self-pay

## 2022-04-03 ENCOUNTER — Emergency Department (HOSPITAL_COMMUNITY)
Admission: EM | Admit: 2022-04-03 | Discharge: 2022-04-03 | Disposition: A | Discharge status: Home / Self Care | Payer: Medicare Other | Attending: Emergency Medicine | Admitting: Emergency Medicine

## 2022-04-03 ENCOUNTER — Other Ambulatory Visit: Payer: Self-pay

## 2022-04-03 DIAGNOSIS — E1122 Type 2 diabetes mellitus with diabetic chronic kidney disease: Secondary | ICD-10-CM | POA: Diagnosis not present

## 2022-04-03 DIAGNOSIS — N183 Chronic kidney disease, stage 3 unspecified: Secondary | ICD-10-CM | POA: Insufficient documentation

## 2022-04-03 DIAGNOSIS — Z7984 Long term (current) use of oral hypoglycemic drugs: Secondary | ICD-10-CM | POA: Insufficient documentation

## 2022-04-03 DIAGNOSIS — Z87891 Personal history of nicotine dependence: Secondary | ICD-10-CM | POA: Insufficient documentation

## 2022-04-03 DIAGNOSIS — L03311 Cellulitis of abdominal wall: Secondary | ICD-10-CM | POA: Insufficient documentation

## 2022-04-03 LAB — CBC WITH DIFFERENTIAL/PLATELET
Abs Immature Granulocytes: 0.03 10*3/uL (ref 0.00–0.07)
Basophils Absolute: 0 10*3/uL (ref 0.0–0.1)
Basophils Relative: 0 %
Eosinophils Absolute: 0.3 10*3/uL (ref 0.0–0.5)
Eosinophils Relative: 4 %
HCT: 37.4 % (ref 36.0–46.0)
Hemoglobin: 12.3 g/dL (ref 12.0–15.0)
Immature Granulocytes: 0 %
Lymphocytes Relative: 16 %
Lymphs Abs: 1.2 10*3/uL (ref 0.7–4.0)
MCH: 29 pg (ref 26.0–34.0)
MCHC: 32.9 g/dL (ref 30.0–36.0)
MCV: 88.2 fL (ref 80.0–100.0)
Monocytes Absolute: 0.5 10*3/uL (ref 0.1–1.0)
Monocytes Relative: 7 %
Neutro Abs: 5.5 10*3/uL (ref 1.7–7.7)
Neutrophils Relative %: 73 %
Platelets: 236 10*3/uL (ref 150–400)
RBC: 4.24 MIL/uL (ref 3.87–5.11)
RDW: 14.3 % (ref 11.5–15.5)
WBC: 7.6 10*3/uL (ref 4.0–10.5)
nRBC: 0 % (ref 0.0–0.2)

## 2022-04-03 LAB — COMPREHENSIVE METABOLIC PANEL
ALT: 29 U/L (ref 0–44)
AST: 30 U/L (ref 15–41)
Albumin: 3.8 g/dL (ref 3.5–5.0)
Alkaline Phosphatase: 85 U/L (ref 38–126)
Anion gap: 8 (ref 5–15)
BUN: 15 mg/dL (ref 8–23)
CO2: 27 mmol/L (ref 22–32)
Calcium: 9.6 mg/dL (ref 8.9–10.3)
Chloride: 101 mmol/L (ref 98–111)
Creatinine, Ser: 0.91 mg/dL (ref 0.44–1.00)
GFR, Estimated: >60 mL/min (ref >60)
Glucose, Bld: 111 mg/dL — ABNORMAL HIGH (ref 70–99)
Potassium: 3.7 mmol/L (ref 3.5–5.1)
Sodium: 136 mmol/L (ref 135–145)
Total Bilirubin: 0.7 mg/dL (ref 0.3–1.2)
Total Protein: 7.6 g/dL (ref 6.5–8.1)

## 2022-04-03 LAB — LACTIC ACID, PLASMA: Lactic Acid, Venous: 1.4 mmol/L (ref 0.5–1.9)

## 2022-04-03 NOTE — Discharge Instructions (Addendum)
Labs very reassuring wound seems to be slowly healing.  Follow-up with your doctor as planned for later this week.  Continue clindamycin.

## 2022-04-03 NOTE — ED Provider Notes (Signed)
[facility] EMERGENCY DEPARTMENT Provider Note   CSN: [id number] Arrival date & time: 04/03/22  1237     History  Chief Complaint  Patient presents with   Cellulitis    Beth Phelps is a 71 y.o. female.  Patient originally admitted at [facility] in [facility] June 11 through June 13th was admitted for abdominal wall cellulitis had CT scan done received IV antibiotics.  Was transition to Augmentin at the time of discharge patient developed a rash so switched to clindamycin.  Patient is concerned that it is getting worse because she has had some burning sensation and is still having a little bit of purulent discharge.  Patient denies any fevers any deep abdominal pain.  Patient was seen by general surgery when she was admitted.  Past medical history significant for diabetes history of sleep apnea obesity chronic pain migraines stage III chronic kidney disease.  Patient's had her gallbladder removed.  Patient former smoker quit 1995.       Home Medications Prior to Admission medications   Medication Sig Start Date End Date Taking? Authorizing Provider  albuterol (VENTOLIN HFA) 108 (90 Base) MCG/ACT inhaler Inhale 1-2 puffs into the lungs every 6 (six) hours as needed for wheezing or shortness of breath. 01/18/21   [provider]  allopurinol (ZYLOPRIM) 100 MG tablet Take 100 mg by mouth daily. 08/16/21   [provider]  ALPRAZolam Duanne Moron) 0.5 MG tablet Take 0.5 mg by mouth 2 (two) times daily as needed for anxiety or sleep. 01/22/20   [provider]  cyclobenzaprine (FLEXERIL) 10 MG tablet Take 1 tablet by mouth every 8 (eight) hours as needed for muscle spasms. 03/16/21   [provider]  esomeprazole (NEXIUM) 40 MG capsule Take 40 mg by mouth daily before breakfast.    [provider]  glimepiride (AMARYL) 2 MG tablet Take 2 mg by mouth daily with breakfast.    [provider]  hydrochlorothiazide 25 MG tablet Take 25 mg by mouth  daily as needed (ankle swelling).    [provider]  ibuprofen (ADVIL) 800 MG tablet Take 1 tablet (800 mg total) by mouth every 8 (eight) hours as needed. 04/07/21   Cornett, Marcello Moores, MD  lisinopril (ZESTRIL) 5 MG tablet Take 20 mg by mouth daily.    [provider]  metFORMIN (GLUCOPHAGE) 1000 MG tablet Take 1,000 mg by mouth 2 (two) times daily with a meal.    [provider]  metoprolol tartrate (LOPRESSOR) 50 MG tablet Take 50 mg by mouth 2 (two) times daily.    [provider]  rosuvastatin (CRESTOR) 5 MG tablet Take 5 mg by mouth every Wednesday. 06/10/20   [provider]      Allergies    Metronidazole and Doxycycline    Review of Systems   Review of Systems  Constitutional:  Negative for chills and fever.  HENT:  Negative for ear pain and sore throat.   Eyes:  Negative for pain and visual disturbance.  Respiratory:  Negative for cough and shortness of breath.   Cardiovascular:  Negative for chest pain and palpitations.  Gastrointestinal:  Negative for abdominal pain, nausea and vomiting.  Genitourinary:  Negative for dysuria and hematuria.  Musculoskeletal:  Negative for arthralgias and back pain.  Skin:  Positive for wound. Negative for color change and rash.  Neurological:  Negative for seizures and syncope.  All other systems reviewed and are negative.   Physical Exam Updated Vital Signs BP 130/87 (BP  Location: Right Wrist)   Pulse 63   Temp 98.2 F (36.8 C) (Oral)   Resp 20   Ht 1.549 m ('5\' 1"'$ )   Wt 120.2 kg   SpO2 97%   BMI 50.07 kg/m  Physical Exam Vitals and nursing note reviewed.  Constitutional:      General: She is not in acute distress.    Appearance: Normal appearance. She is well-developed. She is obese. She is not toxic-appearing.  HENT:     Head: Normocephalic and atraumatic.  Eyes:     Conjunctiva/sclera: Conjunctivae normal.  Cardiovascular:     Rate and Rhythm: Normal rate and regular rhythm.      Heart sounds: No murmur heard. Pulmonary:     Effort: Pulmonary effort is normal. No respiratory distress.     Breath sounds: Normal breath sounds.  Abdominal:     Palpations: Abdomen is soft.     Tenderness: There is no abdominal tenderness.     Comments: In the skin fold on the left lower part of the abdomen there is an area of induration measuring about 2 x 4 cm.  2 punctuate areas with no purulent discharge at this time patient's had some pus has been coming out of that over the last couple days.  May have discharged itself.  No fluctuance.  No significant erythema other than the area where there induration is.  No significant tenderness to palpation of the abdomen or even around the skin wound.  Musculoskeletal:        General: No swelling.     Cervical back: Neck supple.  Skin:    General: Skin is warm and dry.     Capillary Refill: Capillary refill takes less than 2 seconds.  Neurological:     General: No focal deficit present.     Mental Status: She is alert and oriented to person, place, and time.  Psychiatric:        Mood and Affect: Mood normal.     ED Results / Procedures / Treatments   Labs (all labs ordered are listed, but only abnormal results are displayed) Labs Reviewed  COMPREHENSIVE METABOLIC PANEL - Abnormal; Notable for the following components:      Result Value   Glucose, Bld 111 (*)    All other components within normal limits  LACTIC ACID, PLASMA  CBC WITH DIFFERENTIAL/PLATELET  LACTIC ACID, PLASMA  URINALYSIS, ROUTINE W REFLEX MICROSCOPIC    EKG None  Radiology No results found.  Procedures Procedures    Medications Ordered in ED Medications - No data to display  ED Course/ Medical Decision Making/ A&P                           Medical Decision Making Amount and/or Complexity of Data Reviewed Labs: ordered.   Patient actually seems to have pretty good healing of the wound in the left part of the abdomen.  No significant cellulitis.  I  think there was probably some small pus cavities that have come to the surface and drain spontaneously.  Not able to express any purulent discharge at this time.  I think she needs to continue her clindamycin she has follow-up with her primary care doctor on Wednesday of this week.  Patient's labs without any abnormalities lactic acid normal no leukocytosis blood sugars 111 electrolytes are normal hemoglobin 12.3.   Final Clinical Impression(s) / ED Diagnoses Final diagnoses:  Cellulitis of left abdominal wall  Rx / DC Orders ED Discharge Orders     None         Fredia Sorrow, MD 04/03/22 [phone]

## 2022-04-03 NOTE — ED Triage Notes (Signed)
Pt was was admitted at [facility] for cellulitis to her lower abd 1 week ago and was discharged on PO abx. Pt states the infection continues to worsen.   Pt also mentions she tested positive for COVID at that time with the start of the symptoms 2 weeks ago and pt has had symptom resolution.

## 2022-04-04 DIAGNOSIS — G4733 Obstructive sleep apnea (adult) (pediatric): Secondary | ICD-10-CM | POA: Diagnosis not present

## 2022-04-06 DIAGNOSIS — Z713 Dietary counseling and surveillance: Secondary | ICD-10-CM | POA: Diagnosis not present

## 2022-04-06 DIAGNOSIS — I10 Essential (primary) hypertension: Secondary | ICD-10-CM | POA: Diagnosis not present

## 2022-04-06 DIAGNOSIS — G4730 Sleep apnea, unspecified: Secondary | ICD-10-CM | POA: Diagnosis not present

## 2022-04-06 DIAGNOSIS — Z09 Encounter for follow-up examination after completed treatment for conditions other than malignant neoplasm: Secondary | ICD-10-CM | POA: Diagnosis not present

## 2022-04-06 DIAGNOSIS — L02211 Cutaneous abscess of abdominal wall: Secondary | ICD-10-CM | POA: Diagnosis not present

## 2022-04-06 DIAGNOSIS — Z299 Encounter for prophylactic measures, unspecified: Secondary | ICD-10-CM | POA: Diagnosis not present

## 2022-04-13 DIAGNOSIS — N6312 Unspecified lump in the right breast, upper inner quadrant: Secondary | ICD-10-CM | POA: Diagnosis not present

## 2022-04-13 DIAGNOSIS — N6310 Unspecified lump in the right breast, unspecified quadrant: Secondary | ICD-10-CM | POA: Diagnosis not present

## 2022-04-13 DIAGNOSIS — C50911 Malignant neoplasm of unspecified site of right female breast: Secondary | ICD-10-CM | POA: Diagnosis not present

## 2022-04-13 DIAGNOSIS — Z853 Personal history of malignant neoplasm of breast: Secondary | ICD-10-CM | POA: Diagnosis not present

## 2022-04-14 DIAGNOSIS — E1165 Type 2 diabetes mellitus with hyperglycemia: Secondary | ICD-10-CM | POA: Diagnosis not present

## 2022-04-14 NOTE — Telephone Encounter (Signed)
Called and spoke with pt to see if [employer] ever set up her cpap machine and she said they did. Nothing further needed.

## 2022-04-15 DIAGNOSIS — L02211 Cutaneous abscess of abdominal wall: Secondary | ICD-10-CM | POA: Diagnosis not present

## 2022-04-15 DIAGNOSIS — E119 Type 2 diabetes mellitus without complications: Secondary | ICD-10-CM | POA: Diagnosis not present

## 2022-04-15 DIAGNOSIS — I10 Essential (primary) hypertension: Secondary | ICD-10-CM | POA: Diagnosis not present

## 2022-04-15 DIAGNOSIS — C50811 Malignant neoplasm of overlapping sites of right female breast: Secondary | ICD-10-CM | POA: Diagnosis not present

## 2022-04-15 DIAGNOSIS — R600 Localized edema: Secondary | ICD-10-CM | POA: Diagnosis not present

## 2022-04-15 DIAGNOSIS — Z880 Allergy status to penicillin: Secondary | ICD-10-CM | POA: Diagnosis not present

## 2022-04-15 DIAGNOSIS — N261 Atrophy of kidney (terminal): Secondary | ICD-10-CM | POA: Diagnosis not present

## 2022-04-15 DIAGNOSIS — Z87891 Personal history of nicotine dependence: Secondary | ICD-10-CM | POA: Diagnosis not present

## 2022-04-15 DIAGNOSIS — L03311 Cellulitis of abdominal wall: Secondary | ICD-10-CM | POA: Diagnosis not present

## 2022-04-15 DIAGNOSIS — K760 Fatty (change of) liver, not elsewhere classified: Secondary | ICD-10-CM | POA: Diagnosis not present

## 2022-04-15 DIAGNOSIS — E785 Hyperlipidemia, unspecified: Secondary | ICD-10-CM | POA: Diagnosis not present

## 2022-04-15 DIAGNOSIS — N134 Hydroureter: Secondary | ICD-10-CM | POA: Diagnosis not present

## 2022-04-15 DIAGNOSIS — Z7984 Long term (current) use of oral hypoglycemic drugs: Secondary | ICD-10-CM | POA: Diagnosis not present

## 2022-04-21 DIAGNOSIS — R42 Dizziness and giddiness: Secondary | ICD-10-CM | POA: Diagnosis not present

## 2022-04-21 DIAGNOSIS — E860 Dehydration: Secondary | ICD-10-CM | POA: Diagnosis not present

## 2022-04-21 DIAGNOSIS — Z299 Encounter for prophylactic measures, unspecified: Secondary | ICD-10-CM | POA: Diagnosis not present

## 2022-04-21 DIAGNOSIS — N1831 Chronic kidney disease, stage 3a: Secondary | ICD-10-CM | POA: Diagnosis not present

## 2022-04-21 DIAGNOSIS — I10 Essential (primary) hypertension: Secondary | ICD-10-CM | POA: Diagnosis not present

## 2022-04-21 DIAGNOSIS — E1140 Type 2 diabetes mellitus with diabetic neuropathy, unspecified: Secondary | ICD-10-CM | POA: Diagnosis not present

## 2022-05-04 DIAGNOSIS — G4733 Obstructive sleep apnea (adult) (pediatric): Secondary | ICD-10-CM | POA: Diagnosis not present

## 2022-05-13 DIAGNOSIS — Z880 Allergy status to penicillin: Secondary | ICD-10-CM | POA: Diagnosis not present

## 2022-05-13 DIAGNOSIS — L02211 Cutaneous abscess of abdominal wall: Secondary | ICD-10-CM | POA: Diagnosis not present

## 2022-05-13 DIAGNOSIS — C50811 Malignant neoplasm of overlapping sites of right female breast: Secondary | ICD-10-CM | POA: Diagnosis not present

## 2022-05-13 DIAGNOSIS — E785 Hyperlipidemia, unspecified: Secondary | ICD-10-CM | POA: Diagnosis not present

## 2022-05-13 DIAGNOSIS — Z87891 Personal history of nicotine dependence: Secondary | ICD-10-CM | POA: Diagnosis not present

## 2022-05-13 DIAGNOSIS — Z7984 Long term (current) use of oral hypoglycemic drugs: Secondary | ICD-10-CM | POA: Diagnosis not present

## 2022-05-13 DIAGNOSIS — N261 Atrophy of kidney (terminal): Secondary | ICD-10-CM | POA: Diagnosis not present

## 2022-05-13 DIAGNOSIS — N134 Hydroureter: Secondary | ICD-10-CM | POA: Diagnosis not present

## 2022-05-13 DIAGNOSIS — I10 Essential (primary) hypertension: Secondary | ICD-10-CM | POA: Diagnosis not present

## 2022-05-13 DIAGNOSIS — E119 Type 2 diabetes mellitus without complications: Secondary | ICD-10-CM | POA: Diagnosis not present

## 2022-05-13 DIAGNOSIS — L03311 Cellulitis of abdominal wall: Secondary | ICD-10-CM | POA: Diagnosis not present

## 2022-05-13 DIAGNOSIS — K760 Fatty (change of) liver, not elsewhere classified: Secondary | ICD-10-CM | POA: Diagnosis not present

## 2022-05-13 DIAGNOSIS — R600 Localized edema: Secondary | ICD-10-CM | POA: Diagnosis not present

## 2022-05-16 DIAGNOSIS — E1165 Type 2 diabetes mellitus with hyperglycemia: Secondary | ICD-10-CM | POA: Diagnosis not present

## 2022-05-25 DIAGNOSIS — Z299 Encounter for prophylactic measures, unspecified: Secondary | ICD-10-CM | POA: Diagnosis not present

## 2022-05-25 DIAGNOSIS — E1165 Type 2 diabetes mellitus with hyperglycemia: Secondary | ICD-10-CM | POA: Diagnosis not present

## 2022-05-25 DIAGNOSIS — I10 Essential (primary) hypertension: Secondary | ICD-10-CM | POA: Diagnosis not present

## 2022-05-25 DIAGNOSIS — Z713 Dietary counseling and surveillance: Secondary | ICD-10-CM | POA: Diagnosis not present

## 2022-05-31 DIAGNOSIS — N390 Urinary tract infection, site not specified: Secondary | ICD-10-CM | POA: Diagnosis not present

## 2022-05-31 DIAGNOSIS — E113293 Type 2 diabetes mellitus with mild nonproliferative diabetic retinopathy without macular edema, bilateral: Secondary | ICD-10-CM | POA: Diagnosis not present

## 2022-05-31 DIAGNOSIS — J440 Chronic obstructive pulmonary disease with acute lower respiratory infection: Secondary | ICD-10-CM | POA: Diagnosis not present

## 2022-05-31 DIAGNOSIS — I10 Essential (primary) hypertension: Secondary | ICD-10-CM | POA: Diagnosis not present

## 2022-05-31 DIAGNOSIS — Z87891 Personal history of nicotine dependence: Secondary | ICD-10-CM | POA: Diagnosis not present

## 2022-05-31 DIAGNOSIS — J209 Acute bronchitis, unspecified: Secondary | ICD-10-CM | POA: Diagnosis not present

## 2022-05-31 DIAGNOSIS — Z299 Encounter for prophylactic measures, unspecified: Secondary | ICD-10-CM | POA: Diagnosis not present

## 2022-06-04 DIAGNOSIS — G4733 Obstructive sleep apnea (adult) (pediatric): Secondary | ICD-10-CM | POA: Diagnosis not present

## 2022-06-14 ENCOUNTER — Encounter: Payer: Self-pay | Admitting: Internal Medicine

## 2022-06-14 DIAGNOSIS — R350 Frequency of micturition: Secondary | ICD-10-CM | POA: Diagnosis not present

## 2022-06-14 DIAGNOSIS — Z87891 Personal history of nicotine dependence: Secondary | ICD-10-CM | POA: Diagnosis not present

## 2022-06-14 DIAGNOSIS — I10 Essential (primary) hypertension: Secondary | ICD-10-CM | POA: Diagnosis not present

## 2022-06-14 DIAGNOSIS — Z299 Encounter for prophylactic measures, unspecified: Secondary | ICD-10-CM | POA: Diagnosis not present

## 2022-06-14 DIAGNOSIS — N390 Urinary tract infection, site not specified: Secondary | ICD-10-CM | POA: Diagnosis not present

## 2022-06-15 ENCOUNTER — Ambulatory Visit (INDEPENDENT_AMBULATORY_CARE_PROVIDER_SITE_OTHER): Payer: Medicare Other | Admitting: Internal Medicine

## 2022-06-15 ENCOUNTER — Encounter: Payer: Self-pay | Admitting: Internal Medicine

## 2022-06-15 VITALS — BP 126/82 | HR 61 | Temp 98.1°F | Ht 61.0 in | Wt 251.8 lb

## 2022-06-15 DIAGNOSIS — Z9989 Dependence on other enabling machines and devices: Secondary | ICD-10-CM | POA: Diagnosis not present

## 2022-06-15 DIAGNOSIS — G4733 Obstructive sleep apnea (adult) (pediatric): Secondary | ICD-10-CM | POA: Diagnosis not present

## 2022-06-15 DIAGNOSIS — E1165 Type 2 diabetes mellitus with hyperglycemia: Secondary | ICD-10-CM | POA: Diagnosis not present

## 2022-06-15 NOTE — Patient Instructions (Signed)
Please schedule follow up scheduled with myself in 1 year.  If my schedule is not open yet, we will contact you with a reminder closer to that time. Please call [phone] if you haven't heard from [place] a month before.   Keep up the great work using your CPAP machine, and with increasing your mobility with water aerobics. Call me and come see me sooner if you need me.  Good luck with your cataract surgery!

## 2022-06-15 NOTE — Progress Notes (Signed)
Beth Phelps [facility]    [id number]    [birth year]  Primary Care [facility], Beth Hatcher, MD Date of Appointment: 06/15/2022 Established Patient Visit  Chief complaint:   Chief Complaint  Patient presents with   Follow-up    She is doing well with her CPAP and has no concerns.      HPI: Beth Phelps is a 71 y.o. woman with history of tobacco use disorder and breast cancer s/p chemotherapy and radiation.   Interval Updates: Here for follow up after starting CPAP therapy. Feeling much better.   CPAp download reviewed - 100% adherence with suppression of AHI  She has started jogging in the water at the [facility]. Having more energy with better sleep quality.  She is having cataract surgery in the fall.   Would still like to lose weight and consider inspire device.   I have reviewed the patient's family social and past medical history and updated as appropriate.   Past Medical History:  Diagnosis Date   Anxiety    Breast cancer ([place])    Chronic pain    Diabetes mellitus    type 2   Fatty liver    GERD (gastroesophageal reflux disease)    Herpes    Hiatal hernia 11/15/2004   small   History of asthma    History of sleep apnea    HTN (hypertension)    Hyperlipidemia    Migraine    Obesity    Sleep apnea    no CPAP   Stage 3 chronic kidney disease ([facility])    Type 2 diabetes mellitus ([place])     Past Surgical History:  Procedure Laterality Date   ABDOMINAL HYSTERECTOMY     BLADDER SURGERY     CHOLECYSTECTOMY     COLONOSCOPY  09/2012   Dr. Anthony Phelps: normal. H/O prior polyps.   COLONOSCOPY N/A 07/02/2014   Procedure: COLONOSCOPY;  Surgeon: Beth Dolin, MD;  Location: [facility];  Service: Endoscopy;  Laterality: N/A;  7:30   ESOPHAGOGASTRODUODENOSCOPY  11/15/2004   Dr. Laural Phelps- small sliding hiatal hernia with mild changes of reflux esophagitis   MOLE REMOVAL     malignant   PORT-A-CATH REMOVAL Right 04/07/2021   Procedure: REMOVAL PORT-A-CATH;  Surgeon:  Beth Luna, MD;  Location: [place];  Service: General;  Laterality: Right;   PORTACATH PLACEMENT N/A 11/10/2020   Procedure: INSERTION PORT-A-CATH WITH ULTRASOUND GUIDANCE;  Surgeon: Beth Luna, MD;  Location: [place];  Service: General;  Laterality: N/A;    Family History  Problem Relation Age of Onset   Diabetes Father    Colon polyps Father    Heart disease Neg Hx    Colon cancer Neg Hx     Social History   Occupational History   Not on file  Tobacco Use   Smoking status: Former    Packs/day: 1.00    Years: 20.00    Total pack years: 20.00    Types: Cigarettes    Quit date: 07/02/1994    Years since quitting: 27.9   Smokeless tobacco: Never  Vaping Use   Vaping Use: Never used  Substance and Sexual Activity   Alcohol use: No   Drug use: No   Sexual activity: Never    Birth control/protection: Surgical     Physical Exam: Blood pressure 126/82, pulse 61, temperature 98.1 F (36.7 C), temperature source Oral, height '5\' 1"'$  (1.549 m), weight 251 lb 12.8 oz (114.2 kg), SpO2 100 %.  Gen:      No acute distress ENT:   mallampati IV, mmm Lungs:    diminished, clear CV:         RRR no mrg   Data Reviewed: Imaging: I have personally reviewed the   PFTs:     Latest Ref Rng & Units 02/11/2022    9:38 AM  PFT Results  FVC-Pre L 2.15   FVC-Predicted Pre % 82   FVC-Post L 2.29   FVC-Predicted Post % 88   Pre FEV1/FVC % % 81   Post FEV1/FCV % % 86   FEV1-Pre L 1.75   FEV1-Predicted Pre % 89   FEV1-Post L 1.96   DLCO uncorrected ml/min/mmHg 15.51   DLCO UNC% % 88   DLCO corrected ml/min/mmHg 15.51   DLCO COR %Predicted % 88   DLVA Predicted % 94   TLC L 4.25   TLC % Predicted % 92   RV % Predicted % 88    I have personally reviewed the patient's PFTs and normal PFT.   HSAT shows AHI 16.1 events/hour  Labs: Lab Results  Component Value Date   WBC 7.6 04/03/2022   HGB 12.3 04/03/2022   HCT 37.4 04/03/2022   MCV 88.2 04/03/2022   PLT 236  04/03/2022    Lab Results  Component Value Date   NA 136 04/03/2022   K 3.7 04/03/2022   CL 101 04/03/2022   CO2 27 04/03/2022     Immunization status: Immunization History  Administered Date(s) Administered   Fluad Quad(high Dose 65+) 07/30/2020   Influenza Inj Mdck Quad With Preservative 07/09/2018   Influenza Split 12/15/2006, 09/03/2009, 07/25/2012, 07/17/2013, 07/22/2014, 07/19/2016, 07/29/2019   Influenza, High Dose Seasonal PF 07/17/2021   Moderna Sars-Covid-2 Vaccination 03/12/2020   Pneumococcal Conjugate-13 01/02/2019   Pneumococcal Polysaccharide-23 12/16/2006   Tdap 01/04/2019    External Records Personally Reviewed: sleep study  Assessment:  OSA on CPAP   Plan/Recommendations: She shows improvement in clinical symptoms of OSA with use of auto-CPAP 5-15 with a median pressure of 10.2 ch H20. Continue current settings.  Keep up the great work using your CPAP machine, and with increasing your mobility with water aerobics. Call me and come see me sooner if you need me.  Good luck with your cataract surgery!  Regarding upcoming knee and cataract surgery - she does not need any specicif pulmonary pre-op eval for this. Ok to proceed with surgery.    Return to Care: Return in about 1 year (around 06/16/2023).   Beth Llamas, MD Pulmonary and [place]

## 2022-06-23 ENCOUNTER — Ambulatory Visit (INDEPENDENT_AMBULATORY_CARE_PROVIDER_SITE_OTHER): Payer: Medicare Other | Admitting: Podiatry

## 2022-06-23 DIAGNOSIS — E1142 Type 2 diabetes mellitus with diabetic polyneuropathy: Secondary | ICD-10-CM

## 2022-06-23 DIAGNOSIS — L600 Ingrowing nail: Secondary | ICD-10-CM | POA: Diagnosis not present

## 2022-06-23 DIAGNOSIS — M722 Plantar fascial fibromatosis: Secondary | ICD-10-CM | POA: Diagnosis not present

## 2022-06-23 NOTE — Progress Notes (Signed)
  Subjective:  Patient ID: Beth Phelps, female    DOB: [birth year],  MRN: [id number]  Chief Complaint  Patient presents with   Ingrown Toenail    Bilateral ingrown     71 y.o. female presents with the above complaint. History confirmed with patient.  Patient presents with concern for pain at the outside border of both great toes.  She has noticed these ingrown's for weeks to months.  She does note that her nails have been growing abnormally for months related to fungal infection.  She does report she had chemo for breast cancer which seemed to improve the nail fungal problem but the nails continue to bother her.  She has not noticed any redness swelling or drainage from the nail borders but has pain with pressure on the outside borders of both big toes.  She also relates a history of right plantar heel pain.  Has had injections in the past which seem to help.  She is interested in another injection today.  Objective:  Physical Exam: warm, good capillary refill, nail exam onychomycosis of the toenails, ingrown nail at lateral border of right and left hallux, and dystrophic nails, no trophic changes or ulcerative lesions. DP pulses palpable, PT pulses palpable, and protective sensation absent Left Foot: normal exam, no swelling, tenderness, instability; ligaments intact, full range of motion of all ankle/foot joints and tenderness of the lateral border left hallux nail   Right Foot: normal exam, no swelling, tenderness, instability; ligaments intact, full range of motion of all ankle/foot joints and tenderness of the right hallux lateral border.   Also pain with palpation of the plantar medial tubercle and plantar calcaneal spur.   No images are attached to the encounter.  Assessment:   1. Ingrown nail of great toe of right foot   2. Ingrown nail of great toe of left foot   3. Plantar fasciitis of right foot   4. Diabetic polyneuropathy associated with type 2 diabetes mellitus ([place])       Plan:  Patient was evaluated and treated and all questions answered.  Ingrown Nail, bilaterally Lateral border -Patient elects to proceed with minor surgery to remove ingrown toenail today. Consent reviewed and signed by patient. -Ingrown nail excised. See procedure note. -Educated on post-procedure care including soaking. Written instructions provided and reviewed. -Patient to follow up in 2 weeks for nail check.  Procedure: Excision of Ingrown Toenail Location: Bilateral 1st toe lateral nail borders. Anesthesia: Lidocaine 1% plain; 1.5 mL and Marcaine 0.5% plain; 1.5 mL, digital block. Skin Prep: Betadine. Dressing: Silvadene; telfa; dry, sterile, compression dressing. Technique: Following skin prep, the toe was exsanguinated and a tourniquet was secured at the base of the toe. The affected nail border was freed, split with a nail splitter, and excised. Chemical matrixectomy was then performed with phenol and irrigated out with alcohol. The tourniquet was then removed and sterile dressing applied. Disposition: Patient tolerated procedure well. Patient to return in 2 weeks for follow-up.   #Plantar right heel pain Recommend we proceed with a Right heel injection at the insertion of medial band plantar fascia Procedure: Injection Tendon/ligament insertion -  Location: Right plantar fascia at its insertion into plantar heel spur at the glabrous junction; medial approach. Skin Prep: alcohol Injectate: 0.5 cc 0.5% marcaine plain, 1 cc kenalog 10. Disposition: Patient tolerated procedure well. Injection site dressed with a band-aid.   Return in about 2 weeks (around 07/07/2022) for F/u nail avulsion.

## 2022-06-23 NOTE — Patient Instructions (Signed)

## 2022-06-24 DIAGNOSIS — G4733 Obstructive sleep apnea (adult) (pediatric): Secondary | ICD-10-CM | POA: Diagnosis not present

## 2022-07-05 DIAGNOSIS — G4733 Obstructive sleep apnea (adult) (pediatric): Secondary | ICD-10-CM | POA: Diagnosis not present

## 2022-07-14 ENCOUNTER — Ambulatory Visit (INDEPENDENT_AMBULATORY_CARE_PROVIDER_SITE_OTHER): Payer: Medicare Other | Admitting: Podiatry

## 2022-07-14 DIAGNOSIS — L600 Ingrowing nail: Secondary | ICD-10-CM

## 2022-07-14 NOTE — Progress Notes (Signed)
Subjective: Beth Phelps is a 71 y.o. female returns to office today for follow up evaluation after having bilateral hallux bilateral border nail ingrown removal with phenol and alcohol matrixectomy approximately 2 weeks ago. Patient has been soaking using Epsom salt and applying topical antibiotic covered with bandaid daily. Patient denies fevers, chills, nausea, vomiting. Denies any calf pain, chest pain, SOB.   Objective:  Vitals: Reviewed  General: Well developed, nourished, in no acute distress, alert and oriented x3   Dermatology: Skin is warm, dry and supple bilateral.  Bilateral hallux nail border appears to be clean, dry, with mild granular tissue and surrounding scab. There is no surrounding erythema, edema, drainage/purulence. The remaining nails appear unremarkable at this time. There are no other lesions or other signs of infection present.  Neurovascular status: Intact. No lower extremity swelling; No pain with calf compression bilateral.  Musculoskeletal: Decreased tenderness to palpation of the bilateral hallux nail fold(s). Muscular strength within normal limits bilateral.   Assesement and Plan: S/p phenol and alcohol matrixectomy to the bilateral hallux nail bilateral border doing well.   -Continue soaking in epsom salts twice a day followed by antibiotic ointment and a band-aid. Can leave uncovered at night. Continue this until completely healed.  -If the area has not healed in 2 weeks, call the office for follow-up appointment, or sooner if any problems arise.  -Monitor for any signs/symptoms of infection. Call the office immediately if any occur or go directly to the emergency room. Call with any questions/concerns.        Everitt Amber, [place] [facility]                   07/14/2022

## 2022-07-15 DIAGNOSIS — E1165 Type 2 diabetes mellitus with hyperglycemia: Secondary | ICD-10-CM | POA: Diagnosis not present

## 2022-07-25 DIAGNOSIS — H2513 Age-related nuclear cataract, bilateral: Secondary | ICD-10-CM | POA: Diagnosis not present

## 2022-07-25 DIAGNOSIS — H35371 Puckering of macula, right eye: Secondary | ICD-10-CM | POA: Diagnosis not present

## 2022-07-25 DIAGNOSIS — H524 Presbyopia: Secondary | ICD-10-CM | POA: Diagnosis not present

## 2022-07-25 DIAGNOSIS — H2511 Age-related nuclear cataract, right eye: Secondary | ICD-10-CM | POA: Diagnosis not present

## 2022-07-25 DIAGNOSIS — H40051 Ocular hypertension, right eye: Secondary | ICD-10-CM | POA: Diagnosis not present

## 2022-07-25 DIAGNOSIS — H35361 Drusen (degenerative) of macula, right eye: Secondary | ICD-10-CM | POA: Diagnosis not present

## 2022-07-25 DIAGNOSIS — H04123 Dry eye syndrome of bilateral lacrimal glands: Secondary | ICD-10-CM | POA: Diagnosis not present

## 2022-07-28 ENCOUNTER — Encounter (INDEPENDENT_AMBULATORY_CARE_PROVIDER_SITE_OTHER): Payer: Medicare Other | Admitting: Ophthalmology

## 2022-07-28 DIAGNOSIS — H35033 Hypertensive retinopathy, bilateral: Secondary | ICD-10-CM | POA: Diagnosis not present

## 2022-07-28 DIAGNOSIS — H353112 Nonexudative age-related macular degeneration, right eye, intermediate dry stage: Secondary | ICD-10-CM

## 2022-07-28 DIAGNOSIS — H43813 Vitreous degeneration, bilateral: Secondary | ICD-10-CM

## 2022-07-28 DIAGNOSIS — I10 Essential (primary) hypertension: Secondary | ICD-10-CM

## 2022-07-28 DIAGNOSIS — H35371 Puckering of macula, right eye: Secondary | ICD-10-CM | POA: Diagnosis not present

## 2022-08-04 DIAGNOSIS — G4733 Obstructive sleep apnea (adult) (pediatric): Secondary | ICD-10-CM | POA: Diagnosis not present

## 2022-08-09 DIAGNOSIS — H25811 Combined forms of age-related cataract, right eye: Secondary | ICD-10-CM | POA: Diagnosis not present

## 2022-08-09 DIAGNOSIS — H2511 Age-related nuclear cataract, right eye: Secondary | ICD-10-CM | POA: Diagnosis not present

## 2022-08-15 DIAGNOSIS — E1165 Type 2 diabetes mellitus with hyperglycemia: Secondary | ICD-10-CM | POA: Diagnosis not present

## 2022-08-17 DIAGNOSIS — N390 Urinary tract infection, site not specified: Secondary | ICD-10-CM | POA: Diagnosis not present

## 2022-08-17 DIAGNOSIS — G4733 Obstructive sleep apnea (adult) (pediatric): Secondary | ICD-10-CM | POA: Diagnosis not present

## 2022-08-17 DIAGNOSIS — Z299 Encounter for prophylactic measures, unspecified: Secondary | ICD-10-CM | POA: Diagnosis not present

## 2022-08-17 DIAGNOSIS — I10 Essential (primary) hypertension: Secondary | ICD-10-CM | POA: Diagnosis not present

## 2022-08-18 DIAGNOSIS — N390 Urinary tract infection, site not specified: Secondary | ICD-10-CM | POA: Diagnosis not present

## 2022-08-22 DIAGNOSIS — I10 Essential (primary) hypertension: Secondary | ICD-10-CM | POA: Diagnosis not present

## 2022-08-22 DIAGNOSIS — R350 Frequency of micturition: Secondary | ICD-10-CM | POA: Diagnosis not present

## 2022-08-22 DIAGNOSIS — N390 Urinary tract infection, site not specified: Secondary | ICD-10-CM | POA: Diagnosis not present

## 2022-08-22 DIAGNOSIS — Z299 Encounter for prophylactic measures, unspecified: Secondary | ICD-10-CM | POA: Diagnosis not present

## 2022-08-26 DIAGNOSIS — N390 Urinary tract infection, site not specified: Secondary | ICD-10-CM | POA: Diagnosis not present

## 2022-08-26 DIAGNOSIS — R350 Frequency of micturition: Secondary | ICD-10-CM | POA: Diagnosis not present

## 2022-08-26 DIAGNOSIS — I10 Essential (primary) hypertension: Secondary | ICD-10-CM | POA: Diagnosis not present

## 2022-08-26 DIAGNOSIS — Z299 Encounter for prophylactic measures, unspecified: Secondary | ICD-10-CM | POA: Diagnosis not present

## 2022-08-29 DIAGNOSIS — M47816 Spondylosis without myelopathy or radiculopathy, lumbar region: Secondary | ICD-10-CM | POA: Diagnosis not present

## 2022-08-29 DIAGNOSIS — M160 Bilateral primary osteoarthritis of hip: Secondary | ICD-10-CM | POA: Diagnosis not present

## 2022-08-29 DIAGNOSIS — I10 Essential (primary) hypertension: Secondary | ICD-10-CM | POA: Diagnosis not present

## 2022-08-29 DIAGNOSIS — Z299 Encounter for prophylactic measures, unspecified: Secondary | ICD-10-CM | POA: Diagnosis not present

## 2022-08-29 DIAGNOSIS — N390 Urinary tract infection, site not specified: Secondary | ICD-10-CM | POA: Diagnosis not present

## 2022-08-29 DIAGNOSIS — R350 Frequency of micturition: Secondary | ICD-10-CM | POA: Diagnosis not present

## 2022-09-04 DIAGNOSIS — G4733 Obstructive sleep apnea (adult) (pediatric): Secondary | ICD-10-CM | POA: Diagnosis not present

## 2022-09-05 DIAGNOSIS — H25012 Cortical age-related cataract, left eye: Secondary | ICD-10-CM | POA: Diagnosis not present

## 2022-09-05 DIAGNOSIS — H2512 Age-related nuclear cataract, left eye: Secondary | ICD-10-CM | POA: Diagnosis not present

## 2022-09-13 DIAGNOSIS — H2512 Age-related nuclear cataract, left eye: Secondary | ICD-10-CM | POA: Diagnosis not present

## 2022-09-13 DIAGNOSIS — H25042 Posterior subcapsular polar age-related cataract, left eye: Secondary | ICD-10-CM | POA: Diagnosis not present

## 2022-09-13 DIAGNOSIS — H25012 Cortical age-related cataract, left eye: Secondary | ICD-10-CM | POA: Diagnosis not present

## 2022-09-13 DIAGNOSIS — H25812 Combined forms of age-related cataract, left eye: Secondary | ICD-10-CM | POA: Diagnosis not present

## 2022-09-14 DIAGNOSIS — E1165 Type 2 diabetes mellitus with hyperglycemia: Secondary | ICD-10-CM | POA: Diagnosis not present

## 2022-09-23 DIAGNOSIS — G4733 Obstructive sleep apnea (adult) (pediatric): Secondary | ICD-10-CM | POA: Diagnosis not present

## 2022-09-30 DIAGNOSIS — N3020 Other chronic cystitis without hematuria: Secondary | ICD-10-CM | POA: Diagnosis not present

## 2022-09-30 DIAGNOSIS — R3915 Urgency of urination: Secondary | ICD-10-CM | POA: Diagnosis not present

## 2022-09-30 DIAGNOSIS — R350 Frequency of micturition: Secondary | ICD-10-CM | POA: Diagnosis not present

## 2022-10-04 DIAGNOSIS — G4733 Obstructive sleep apnea (adult) (pediatric): Secondary | ICD-10-CM | POA: Diagnosis not present

## 2022-10-13 DIAGNOSIS — H02841 Edema of right upper eyelid: Secondary | ICD-10-CM | POA: Diagnosis not present

## 2022-10-14 DIAGNOSIS — E1165 Type 2 diabetes mellitus with hyperglycemia: Secondary | ICD-10-CM | POA: Diagnosis not present

## 2022-10-20 ENCOUNTER — Encounter (INDEPENDENT_AMBULATORY_CARE_PROVIDER_SITE_OTHER): Payer: Medicare Other | Admitting: Ophthalmology

## 2022-10-21 DIAGNOSIS — J449 Chronic obstructive pulmonary disease, unspecified: Secondary | ICD-10-CM | POA: Diagnosis not present

## 2022-10-21 DIAGNOSIS — I10 Essential (primary) hypertension: Secondary | ICD-10-CM | POA: Diagnosis not present

## 2022-10-21 DIAGNOSIS — E1165 Type 2 diabetes mellitus with hyperglycemia: Secondary | ICD-10-CM | POA: Diagnosis not present

## 2022-10-21 DIAGNOSIS — D692 Other nonthrombocytopenic purpura: Secondary | ICD-10-CM | POA: Diagnosis not present

## 2022-10-21 DIAGNOSIS — Z299 Encounter for prophylactic measures, unspecified: Secondary | ICD-10-CM | POA: Diagnosis not present

## 2022-10-24 DIAGNOSIS — G4733 Obstructive sleep apnea (adult) (pediatric): Secondary | ICD-10-CM | POA: Diagnosis not present

## 2022-10-25 ENCOUNTER — Encounter (INDEPENDENT_AMBULATORY_CARE_PROVIDER_SITE_OTHER): Payer: Medicare Other | Admitting: Ophthalmology

## 2022-10-25 DIAGNOSIS — Z299 Encounter for prophylactic measures, unspecified: Secondary | ICD-10-CM | POA: Diagnosis not present

## 2022-10-25 DIAGNOSIS — J440 Chronic obstructive pulmonary disease with acute lower respiratory infection: Secondary | ICD-10-CM | POA: Diagnosis not present

## 2022-10-25 DIAGNOSIS — I10 Essential (primary) hypertension: Secondary | ICD-10-CM | POA: Diagnosis not present

## 2022-10-25 DIAGNOSIS — E113293 Type 2 diabetes mellitus with mild nonproliferative diabetic retinopathy without macular edema, bilateral: Secondary | ICD-10-CM | POA: Diagnosis not present

## 2022-10-25 DIAGNOSIS — J209 Acute bronchitis, unspecified: Secondary | ICD-10-CM | POA: Diagnosis not present

## 2022-10-25 DIAGNOSIS — B356 Tinea cruris: Secondary | ICD-10-CM | POA: Diagnosis not present

## 2022-10-31 ENCOUNTER — Encounter (INDEPENDENT_AMBULATORY_CARE_PROVIDER_SITE_OTHER): Payer: 59 | Admitting: Ophthalmology

## 2022-10-31 DIAGNOSIS — H43813 Vitreous degeneration, bilateral: Secondary | ICD-10-CM | POA: Diagnosis not present

## 2022-10-31 DIAGNOSIS — H353112 Nonexudative age-related macular degeneration, right eye, intermediate dry stage: Secondary | ICD-10-CM

## 2022-10-31 DIAGNOSIS — H35371 Puckering of macula, right eye: Secondary | ICD-10-CM | POA: Diagnosis not present

## 2022-10-31 DIAGNOSIS — I10 Essential (primary) hypertension: Secondary | ICD-10-CM

## 2022-10-31 DIAGNOSIS — H35033 Hypertensive retinopathy, bilateral: Secondary | ICD-10-CM | POA: Diagnosis not present

## 2022-11-04 DIAGNOSIS — G4733 Obstructive sleep apnea (adult) (pediatric): Secondary | ICD-10-CM | POA: Diagnosis not present

## 2022-11-14 DIAGNOSIS — E1165 Type 2 diabetes mellitus with hyperglycemia: Secondary | ICD-10-CM | POA: Diagnosis not present

## 2022-11-24 DIAGNOSIS — G4733 Obstructive sleep apnea (adult) (pediatric): Secondary | ICD-10-CM | POA: Diagnosis not present

## 2022-11-28 DIAGNOSIS — L819 Disorder of pigmentation, unspecified: Secondary | ICD-10-CM | POA: Diagnosis not present

## 2022-11-28 DIAGNOSIS — Z299 Encounter for prophylactic measures, unspecified: Secondary | ICD-10-CM | POA: Diagnosis not present

## 2022-11-28 DIAGNOSIS — I10 Essential (primary) hypertension: Secondary | ICD-10-CM | POA: Diagnosis not present

## 2022-12-02 DIAGNOSIS — Z299 Encounter for prophylactic measures, unspecified: Secondary | ICD-10-CM | POA: Diagnosis not present

## 2022-12-02 DIAGNOSIS — G620 Drug-induced polyneuropathy: Secondary | ICD-10-CM | POA: Diagnosis not present

## 2022-12-02 DIAGNOSIS — Z0000 Encounter for general adult medical examination without abnormal findings: Secondary | ICD-10-CM | POA: Diagnosis not present

## 2022-12-02 DIAGNOSIS — T451X5A Adverse effect of antineoplastic and immunosuppressive drugs, initial encounter: Secondary | ICD-10-CM | POA: Diagnosis not present

## 2022-12-02 DIAGNOSIS — I10 Essential (primary) hypertension: Secondary | ICD-10-CM | POA: Diagnosis not present

## 2022-12-02 DIAGNOSIS — Z7189 Other specified counseling: Secondary | ICD-10-CM | POA: Diagnosis not present

## 2022-12-02 DIAGNOSIS — Z87891 Personal history of nicotine dependence: Secondary | ICD-10-CM | POA: Diagnosis not present

## 2022-12-05 DIAGNOSIS — G4733 Obstructive sleep apnea (adult) (pediatric): Secondary | ICD-10-CM | POA: Diagnosis not present

## 2022-12-14 DIAGNOSIS — R8271 Bacteriuria: Secondary | ICD-10-CM | POA: Diagnosis not present

## 2022-12-14 DIAGNOSIS — N3000 Acute cystitis without hematuria: Secondary | ICD-10-CM | POA: Diagnosis not present

## 2022-12-14 DIAGNOSIS — R350 Frequency of micturition: Secondary | ICD-10-CM | POA: Diagnosis not present

## 2022-12-14 DIAGNOSIS — E1165 Type 2 diabetes mellitus with hyperglycemia: Secondary | ICD-10-CM | POA: Diagnosis not present

## 2022-12-14 DIAGNOSIS — N3020 Other chronic cystitis without hematuria: Secondary | ICD-10-CM | POA: Diagnosis not present

## 2022-12-17 DIAGNOSIS — Z87891 Personal history of nicotine dependence: Secondary | ICD-10-CM | POA: Diagnosis not present

## 2022-12-17 DIAGNOSIS — K219 Gastro-esophageal reflux disease without esophagitis: Secondary | ICD-10-CM | POA: Diagnosis not present

## 2022-12-17 DIAGNOSIS — Z20822 Contact with and (suspected) exposure to covid-19: Secondary | ICD-10-CM | POA: Diagnosis not present

## 2022-12-17 DIAGNOSIS — J209 Acute bronchitis, unspecified: Secondary | ICD-10-CM | POA: Diagnosis not present

## 2022-12-17 DIAGNOSIS — J40 Bronchitis, not specified as acute or chronic: Secondary | ICD-10-CM | POA: Diagnosis not present

## 2022-12-17 DIAGNOSIS — E119 Type 2 diabetes mellitus without complications: Secondary | ICD-10-CM | POA: Diagnosis not present

## 2022-12-17 DIAGNOSIS — E785 Hyperlipidemia, unspecified: Secondary | ICD-10-CM | POA: Diagnosis not present

## 2022-12-17 DIAGNOSIS — I10 Essential (primary) hypertension: Secondary | ICD-10-CM | POA: Diagnosis not present

## 2022-12-17 DIAGNOSIS — Z1152 Encounter for screening for COVID-19: Secondary | ICD-10-CM | POA: Diagnosis not present

## 2022-12-17 DIAGNOSIS — Z7984 Long term (current) use of oral hypoglycemic drugs: Secondary | ICD-10-CM | POA: Diagnosis not present

## 2022-12-17 DIAGNOSIS — Z79899 Other long term (current) drug therapy: Secondary | ICD-10-CM | POA: Diagnosis not present

## 2022-12-17 DIAGNOSIS — Z853 Personal history of malignant neoplasm of breast: Secondary | ICD-10-CM | POA: Diagnosis not present

## 2022-12-17 DIAGNOSIS — F32A Depression, unspecified: Secondary | ICD-10-CM | POA: Diagnosis not present

## 2022-12-17 DIAGNOSIS — R059 Cough, unspecified: Secondary | ICD-10-CM | POA: Diagnosis not present

## 2023-01-14 DIAGNOSIS — E1165 Type 2 diabetes mellitus with hyperglycemia: Secondary | ICD-10-CM | POA: Diagnosis not present

## 2023-02-14 DIAGNOSIS — E1165 Type 2 diabetes mellitus with hyperglycemia: Secondary | ICD-10-CM | POA: Diagnosis not present

## 2023-02-20 DIAGNOSIS — C44319 Basal cell carcinoma of skin of other parts of face: Secondary | ICD-10-CM | POA: Diagnosis not present

## 2023-02-20 DIAGNOSIS — D485 Neoplasm of uncertain behavior of skin: Secondary | ICD-10-CM | POA: Diagnosis not present

## 2023-02-20 DIAGNOSIS — C44311 Basal cell carcinoma of skin of nose: Secondary | ICD-10-CM | POA: Diagnosis not present

## 2023-02-20 DIAGNOSIS — C44622 Squamous cell carcinoma of skin of right upper limb, including shoulder: Secondary | ICD-10-CM | POA: Diagnosis not present

## 2023-03-08 DIAGNOSIS — C44311 Basal cell carcinoma of skin of nose: Secondary | ICD-10-CM | POA: Diagnosis not present

## 2023-03-14 DIAGNOSIS — I10 Essential (primary) hypertension: Secondary | ICD-10-CM | POA: Diagnosis not present

## 2023-03-14 DIAGNOSIS — Z299 Encounter for prophylactic measures, unspecified: Secondary | ICD-10-CM | POA: Diagnosis not present

## 2023-03-14 DIAGNOSIS — R42 Dizziness and giddiness: Secondary | ICD-10-CM | POA: Diagnosis not present

## 2023-03-15 DIAGNOSIS — R300 Dysuria: Secondary | ICD-10-CM | POA: Diagnosis not present

## 2023-03-15 DIAGNOSIS — R350 Frequency of micturition: Secondary | ICD-10-CM | POA: Diagnosis not present

## 2023-03-15 DIAGNOSIS — R3915 Urgency of urination: Secondary | ICD-10-CM | POA: Diagnosis not present

## 2023-03-15 DIAGNOSIS — N3020 Other chronic cystitis without hematuria: Secondary | ICD-10-CM | POA: Diagnosis not present

## 2023-03-17 DIAGNOSIS — E1165 Type 2 diabetes mellitus with hyperglycemia: Secondary | ICD-10-CM | POA: Diagnosis not present

## 2023-03-22 DIAGNOSIS — Z0000 Encounter for general adult medical examination without abnormal findings: Secondary | ICD-10-CM | POA: Diagnosis not present

## 2023-03-22 DIAGNOSIS — E7800 Pure hypercholesterolemia, unspecified: Secondary | ICD-10-CM | POA: Diagnosis not present

## 2023-03-22 DIAGNOSIS — R5383 Other fatigue: Secondary | ICD-10-CM | POA: Diagnosis not present

## 2023-03-22 DIAGNOSIS — Z79899 Other long term (current) drug therapy: Secondary | ICD-10-CM | POA: Diagnosis not present

## 2023-03-22 DIAGNOSIS — Z299 Encounter for prophylactic measures, unspecified: Secondary | ICD-10-CM | POA: Diagnosis not present

## 2023-03-22 DIAGNOSIS — I10 Essential (primary) hypertension: Secondary | ICD-10-CM | POA: Diagnosis not present

## 2023-03-22 DIAGNOSIS — E1165 Type 2 diabetes mellitus with hyperglycemia: Secondary | ICD-10-CM | POA: Diagnosis not present

## 2023-03-23 LAB — LAB REPORT - SCANNED
A1c: 7.3
Calcium: 9.3
EGFR: 66
TSH: 1.79 (ref 0.41–5.90)

## 2023-03-29 DIAGNOSIS — Z299 Encounter for prophylactic measures, unspecified: Secondary | ICD-10-CM | POA: Diagnosis not present

## 2023-03-29 DIAGNOSIS — M25561 Pain in right knee: Secondary | ICD-10-CM | POA: Diagnosis not present

## 2023-03-29 DIAGNOSIS — N390 Urinary tract infection, site not specified: Secondary | ICD-10-CM | POA: Diagnosis not present

## 2023-03-29 DIAGNOSIS — I10 Essential (primary) hypertension: Secondary | ICD-10-CM | POA: Diagnosis not present

## 2023-03-30 DIAGNOSIS — M1712 Unilateral primary osteoarthritis, left knee: Secondary | ICD-10-CM | POA: Diagnosis not present

## 2023-03-30 DIAGNOSIS — I10 Essential (primary) hypertension: Secondary | ICD-10-CM | POA: Diagnosis not present

## 2023-03-30 DIAGNOSIS — Z299 Encounter for prophylactic measures, unspecified: Secondary | ICD-10-CM | POA: Diagnosis not present

## 2023-03-31 DIAGNOSIS — R350 Frequency of micturition: Secondary | ICD-10-CM | POA: Diagnosis not present

## 2023-03-31 DIAGNOSIS — R3915 Urgency of urination: Secondary | ICD-10-CM | POA: Diagnosis not present

## 2023-03-31 DIAGNOSIS — R300 Dysuria: Secondary | ICD-10-CM | POA: Diagnosis not present

## 2023-03-31 DIAGNOSIS — N3000 Acute cystitis without hematuria: Secondary | ICD-10-CM | POA: Diagnosis not present

## 2023-03-31 DIAGNOSIS — R8271 Bacteriuria: Secondary | ICD-10-CM | POA: Diagnosis not present

## 2023-04-04 DIAGNOSIS — H35371 Puckering of macula, right eye: Secondary | ICD-10-CM | POA: Diagnosis not present

## 2023-04-12 DIAGNOSIS — Z299 Encounter for prophylactic measures, unspecified: Secondary | ICD-10-CM | POA: Diagnosis not present

## 2023-04-12 DIAGNOSIS — I10 Essential (primary) hypertension: Secondary | ICD-10-CM | POA: Diagnosis not present

## 2023-04-12 DIAGNOSIS — M25531 Pain in right wrist: Secondary | ICD-10-CM | POA: Diagnosis not present

## 2023-04-16 DIAGNOSIS — E1165 Type 2 diabetes mellitus with hyperglycemia: Secondary | ICD-10-CM | POA: Diagnosis not present

## 2023-04-18 DIAGNOSIS — C44311 Basal cell carcinoma of skin of nose: Secondary | ICD-10-CM | POA: Diagnosis not present

## 2023-05-02 DIAGNOSIS — Z85828 Personal history of other malignant neoplasm of skin: Secondary | ICD-10-CM | POA: Diagnosis not present

## 2023-05-02 DIAGNOSIS — C44529 Squamous cell carcinoma of skin of other part of trunk: Secondary | ICD-10-CM | POA: Diagnosis not present

## 2023-05-02 DIAGNOSIS — C44629 Squamous cell carcinoma of skin of left upper limb, including shoulder: Secondary | ICD-10-CM | POA: Diagnosis not present

## 2023-05-02 DIAGNOSIS — Z08 Encounter for follow-up examination after completed treatment for malignant neoplasm: Secondary | ICD-10-CM | POA: Diagnosis not present

## 2023-05-10 ENCOUNTER — Telehealth: Payer: Self-pay | Admitting: *Deleted

## 2023-05-10 NOTE — Progress Notes (Signed)
  Care Coordination  Outreach Note  05/10/2023 Name: Beth Phelps MRN: [id number] DOB: Sep 23, 1951   Care Coordination Outreach Attempts: An unsuccessful telephone outreach was attempted today to offer the patient information about available care coordination services.  Follow Up Plan:  Additional outreach attempts will be made to offer the patient care coordination information and services.   Encounter Outcome:  No Answer  Christie Nottingham  Care Coordination Care Guide  Direct Dial: [phone]

## 2023-05-15 NOTE — Progress Notes (Signed)
  Care Coordination  Outreach Note  05/15/2023 Name: Beth Phelps MRN: [id number] DOB: [birth year]   Care Coordination Outreach Attempts: A second unsuccessful outreach was attempted today to offer the patient with information about available care coordination services.  Follow Up Plan:  Additional outreach attempts will be made to offer the patient care coordination information and services.   Encounter Outcome:  No Answer  Christie Nottingham  Care Coordination Care Guide  Direct Dial: [phone]

## 2023-05-17 DIAGNOSIS — E1165 Type 2 diabetes mellitus with hyperglycemia: Secondary | ICD-10-CM | POA: Diagnosis not present

## 2023-05-17 NOTE — Progress Notes (Signed)
  Care Coordination  Outreach Note  05/17/2023 Name: Beth Phelps MRN: [id number] DOB: [birth year]   Care Coordination Outreach Attempts: A third unsuccessful outreach was attempted today to offer the patient with information about available care coordination services.  Follow Up Plan:  No further outreach attempts will be made at this time. We have been unable to contact the patient to offer or enroll patient in care coordination services  Encounter Outcome:  No Answer  Christie Nottingham  Care Coordination Care Guide  Direct Dial: [phone]

## 2023-05-24 DIAGNOSIS — Z85828 Personal history of other malignant neoplasm of skin: Secondary | ICD-10-CM | POA: Diagnosis not present

## 2023-05-24 DIAGNOSIS — Z08 Encounter for follow-up examination after completed treatment for malignant neoplasm: Secondary | ICD-10-CM | POA: Diagnosis not present

## 2023-05-24 DIAGNOSIS — C44729 Squamous cell carcinoma of skin of left lower limb, including hip: Secondary | ICD-10-CM | POA: Diagnosis not present

## 2023-05-26 DIAGNOSIS — Z299 Encounter for prophylactic measures, unspecified: Secondary | ICD-10-CM | POA: Diagnosis not present

## 2023-05-26 DIAGNOSIS — E1165 Type 2 diabetes mellitus with hyperglycemia: Secondary | ICD-10-CM | POA: Diagnosis not present

## 2023-05-26 DIAGNOSIS — M25561 Pain in right knee: Secondary | ICD-10-CM | POA: Diagnosis not present

## 2023-05-26 DIAGNOSIS — M25562 Pain in left knee: Secondary | ICD-10-CM | POA: Diagnosis not present

## 2023-05-26 DIAGNOSIS — M170 Bilateral primary osteoarthritis of knee: Secondary | ICD-10-CM | POA: Diagnosis not present

## 2023-05-26 DIAGNOSIS — R5383 Other fatigue: Secondary | ICD-10-CM | POA: Diagnosis not present

## 2023-05-30 DIAGNOSIS — U071 COVID-19: Secondary | ICD-10-CM | POA: Diagnosis not present

## 2023-05-30 DIAGNOSIS — I10 Essential (primary) hypertension: Secondary | ICD-10-CM | POA: Diagnosis not present

## 2023-05-30 DIAGNOSIS — Z9049 Acquired absence of other specified parts of digestive tract: Secondary | ICD-10-CM | POA: Diagnosis not present

## 2023-05-30 DIAGNOSIS — R059 Cough, unspecified: Secondary | ICD-10-CM | POA: Diagnosis not present

## 2023-05-30 DIAGNOSIS — Z803 Family history of malignant neoplasm of breast: Secondary | ICD-10-CM | POA: Diagnosis not present

## 2023-05-30 DIAGNOSIS — Z853 Personal history of malignant neoplasm of breast: Secondary | ICD-10-CM | POA: Diagnosis not present

## 2023-05-30 DIAGNOSIS — E119 Type 2 diabetes mellitus without complications: Secondary | ICD-10-CM | POA: Diagnosis not present

## 2023-05-30 DIAGNOSIS — Z9221 Personal history of antineoplastic chemotherapy: Secondary | ICD-10-CM | POA: Diagnosis not present

## 2023-06-01 DIAGNOSIS — Z87891 Personal history of nicotine dependence: Secondary | ICD-10-CM | POA: Diagnosis not present

## 2023-06-01 DIAGNOSIS — J441 Chronic obstructive pulmonary disease with (acute) exacerbation: Secondary | ICD-10-CM | POA: Diagnosis not present

## 2023-06-01 DIAGNOSIS — C50811 Malignant neoplasm of overlapping sites of right female breast: Secondary | ICD-10-CM | POA: Diagnosis not present

## 2023-06-01 DIAGNOSIS — Z7984 Long term (current) use of oral hypoglycemic drugs: Secondary | ICD-10-CM | POA: Diagnosis not present

## 2023-06-01 DIAGNOSIS — U071 COVID-19: Secondary | ICD-10-CM | POA: Diagnosis not present

## 2023-06-01 DIAGNOSIS — I10 Essential (primary) hypertension: Secondary | ICD-10-CM | POA: Diagnosis not present

## 2023-06-01 DIAGNOSIS — Z880 Allergy status to penicillin: Secondary | ICD-10-CM | POA: Diagnosis not present

## 2023-06-01 DIAGNOSIS — Z170 Estrogen receptor positive status [ER+]: Secondary | ICD-10-CM | POA: Diagnosis not present

## 2023-06-01 DIAGNOSIS — R059 Cough, unspecified: Secondary | ICD-10-CM | POA: Diagnosis not present

## 2023-06-01 DIAGNOSIS — E119 Type 2 diabetes mellitus without complications: Secondary | ICD-10-CM | POA: Diagnosis not present

## 2023-06-01 DIAGNOSIS — E785 Hyperlipidemia, unspecified: Secondary | ICD-10-CM | POA: Diagnosis not present

## 2023-06-02 DIAGNOSIS — I10 Essential (primary) hypertension: Secondary | ICD-10-CM | POA: Diagnosis not present

## 2023-06-02 DIAGNOSIS — Z299 Encounter for prophylactic measures, unspecified: Secondary | ICD-10-CM | POA: Diagnosis not present

## 2023-06-02 DIAGNOSIS — U071 COVID-19: Secondary | ICD-10-CM | POA: Diagnosis not present

## 2023-06-02 DIAGNOSIS — J449 Chronic obstructive pulmonary disease, unspecified: Secondary | ICD-10-CM | POA: Diagnosis not present

## 2023-06-17 DIAGNOSIS — E1165 Type 2 diabetes mellitus with hyperglycemia: Secondary | ICD-10-CM | POA: Diagnosis not present

## 2023-06-26 DIAGNOSIS — I10 Essential (primary) hypertension: Secondary | ICD-10-CM | POA: Diagnosis not present

## 2023-06-26 DIAGNOSIS — E1165 Type 2 diabetes mellitus with hyperglycemia: Secondary | ICD-10-CM | POA: Diagnosis not present

## 2023-06-26 DIAGNOSIS — Z299 Encounter for prophylactic measures, unspecified: Secondary | ICD-10-CM | POA: Diagnosis not present

## 2023-06-26 LAB — LAB REPORT - SCANNED: A1c: 6.6

## 2023-07-10 DIAGNOSIS — Z08 Encounter for follow-up examination after completed treatment for malignant neoplasm: Secondary | ICD-10-CM | POA: Diagnosis not present

## 2023-07-10 DIAGNOSIS — Z85828 Personal history of other malignant neoplasm of skin: Secondary | ICD-10-CM | POA: Diagnosis not present

## 2023-07-10 DIAGNOSIS — B078 Other viral warts: Secondary | ICD-10-CM | POA: Diagnosis not present

## 2023-07-17 DIAGNOSIS — Z299 Encounter for prophylactic measures, unspecified: Secondary | ICD-10-CM | POA: Diagnosis not present

## 2023-07-17 DIAGNOSIS — E1165 Type 2 diabetes mellitus with hyperglycemia: Secondary | ICD-10-CM | POA: Diagnosis not present

## 2023-07-17 DIAGNOSIS — I10 Essential (primary) hypertension: Secondary | ICD-10-CM | POA: Diagnosis not present

## 2023-07-25 ENCOUNTER — Encounter (INDEPENDENT_AMBULATORY_CARE_PROVIDER_SITE_OTHER): Payer: 59 | Admitting: Ophthalmology

## 2023-08-16 DIAGNOSIS — I10 Essential (primary) hypertension: Secondary | ICD-10-CM | POA: Diagnosis not present

## 2023-08-16 DIAGNOSIS — Z299 Encounter for prophylactic measures, unspecified: Secondary | ICD-10-CM | POA: Diagnosis not present

## 2023-08-16 DIAGNOSIS — G620 Drug-induced polyneuropathy: Secondary | ICD-10-CM | POA: Diagnosis not present

## 2023-08-16 DIAGNOSIS — E1165 Type 2 diabetes mellitus with hyperglycemia: Secondary | ICD-10-CM | POA: Diagnosis not present

## 2023-08-18 DIAGNOSIS — R3915 Urgency of urination: Secondary | ICD-10-CM | POA: Diagnosis not present

## 2023-08-18 DIAGNOSIS — N3020 Other chronic cystitis without hematuria: Secondary | ICD-10-CM | POA: Diagnosis not present

## 2023-08-18 DIAGNOSIS — R350 Frequency of micturition: Secondary | ICD-10-CM | POA: Diagnosis not present

## 2023-08-24 ENCOUNTER — Ambulatory Visit (INDEPENDENT_AMBULATORY_CARE_PROVIDER_SITE_OTHER): Payer: 59 | Admitting: Podiatry

## 2023-08-24 ENCOUNTER — Encounter: Payer: Self-pay | Admitting: Podiatry

## 2023-08-24 VITALS — Ht 61.0 in | Wt 251.8 lb

## 2023-08-24 DIAGNOSIS — M79675 Pain in left toe(s): Secondary | ICD-10-CM

## 2023-08-24 DIAGNOSIS — B351 Tinea unguium: Secondary | ICD-10-CM

## 2023-08-24 DIAGNOSIS — M79674 Pain in right toe(s): Secondary | ICD-10-CM

## 2023-08-25 NOTE — Progress Notes (Signed)
Subjective:   Patient ID: Beth Phelps, female   DOB: 72 y.o.   MRN: [id number]   HPI Patient presents stating she feels like she has ingrown toenails again on her big toes that are bothersome.  Patient points to the lateral borders of each toe.  Also states that her toenails in general get tender   ROS      Objective:  Physical Exam  Neurovascular status intact patient is found to have thickened nailbeds 1-5 both feet which get incurvated in the corners and tender with the hallux specifically more tender     Assessment:  Ingrown toenail deformity with mycotic nail component 1-5 both feet with pain     Plan:  H&P reviewed and if we ever want to do anything permanent for the big toenails I think will be entire nail removal permanently I will get a hold off and today I did debridement of all nailbeds 1-5 both feet Neutra genic bleeding this can be repeated as needed

## 2023-09-15 DIAGNOSIS — E1165 Type 2 diabetes mellitus with hyperglycemia: Secondary | ICD-10-CM | POA: Diagnosis not present

## 2023-09-18 DIAGNOSIS — Z299 Encounter for prophylactic measures, unspecified: Secondary | ICD-10-CM | POA: Diagnosis not present

## 2023-09-18 DIAGNOSIS — E1165 Type 2 diabetes mellitus with hyperglycemia: Secondary | ICD-10-CM | POA: Diagnosis not present

## 2023-09-19 DIAGNOSIS — G4733 Obstructive sleep apnea (adult) (pediatric): Secondary | ICD-10-CM | POA: Diagnosis not present

## 2023-10-03 DIAGNOSIS — H35371 Puckering of macula, right eye: Secondary | ICD-10-CM | POA: Diagnosis not present

## 2023-10-05 DIAGNOSIS — Z299 Encounter for prophylactic measures, unspecified: Secondary | ICD-10-CM | POA: Diagnosis not present

## 2023-10-05 DIAGNOSIS — E1165 Type 2 diabetes mellitus with hyperglycemia: Secondary | ICD-10-CM | POA: Diagnosis not present

## 2023-10-05 DIAGNOSIS — I10 Essential (primary) hypertension: Secondary | ICD-10-CM | POA: Diagnosis not present

## 2023-10-16 DIAGNOSIS — E1165 Type 2 diabetes mellitus with hyperglycemia: Secondary | ICD-10-CM | POA: Diagnosis not present

## 2023-11-02 ENCOUNTER — Encounter: Payer: 59 | Attending: Family Medicine | Admitting: Nutrition

## 2023-11-02 ENCOUNTER — Encounter: Payer: Self-pay | Admitting: Nutrition

## 2023-11-02 VITALS — Ht 61.0 in | Wt 217.0 lb

## 2023-11-02 DIAGNOSIS — K760 Fatty (change of) liver, not elsewhere classified: Secondary | ICD-10-CM | POA: Diagnosis not present

## 2023-11-02 DIAGNOSIS — E66811 Obesity, class 1: Secondary | ICD-10-CM | POA: Insufficient documentation

## 2023-11-02 DIAGNOSIS — E7800 Pure hypercholesterolemia, unspecified: Secondary | ICD-10-CM | POA: Diagnosis not present

## 2023-11-02 DIAGNOSIS — E118 Type 2 diabetes mellitus with unspecified complications: Secondary | ICD-10-CM | POA: Insufficient documentation

## 2023-11-02 DIAGNOSIS — E6609 Other obesity due to excess calories: Secondary | ICD-10-CM | POA: Diagnosis present

## 2023-11-02 NOTE — Patient Instructions (Addendum)
Goals  Eat more whole plant based foods Eat 1 piece of fruit with each meal and 2 vegetables with lunch and dinner Increase dried beans, peas, lentils. Walk to a mile in am and 1 mile in pm on treadmill Cut out cheese and sugar Increase high fiber foods. Have MD check Vit D, B12 and Pre Albumin levels

## 2023-11-02 NOTE — Progress Notes (Signed)
Medical Nutrition Therapy  Appointment Start time:  0930  Appointment End time:  1030  Primary concerns today: Type 2 DM, Obesity, Fatty LIver  Referral diagnosis: E11.32, N18.3, K76 Preferred learning style: No preference  Learning readiness: Ready    NUTRITION ASSESSMENT  "I have been dieting on my own and have lost weight finally. I need to find out how to get rid of my fatty liver." 73 yr old wfemale referred for Type 2 DM, Obesity and Fatty Liver. PCP Ms. Lorette Ang, FNP in [place], [place]. A1C 6.6%, down from 7.1%. Has been taken off of her GLipizide and Metformin and is only taking Mounjero 10 mg a day. Has lost 33 lbs in the last 2 months. Diet recall is very restrictive and insuffient to meet her nutritional needs. She is at high risk for Protein Calorie Malnutrition. She notes her hair is falling out a lot. Has some memory issues.  Walks on treadmill daily for 30 minutes twice a day but distance may only be about 1/2 mile each time. She is drinking 90 oz of water with lemon daily.  Diet is insuffient in protein, calories, CHO, healthy fats, fruits and vegetables. She has been avoiding any food that . Clinical  Wt Readings from Last 3 Encounters:  11/02/23 217 lb (98.4 kg)  08/24/23 251 lb 12.8 oz (114.2 kg)  06/15/22 251 lb 12.8 oz (114.2 kg)   Ht Readings from Last 3 Encounters:  11/02/23 5\' 1"  (1.549 m)  08/24/23 5\' 1"  (1.549 m)  06/15/22 5\' 1"  (1.549 m)   Body mass index is 41 kg/m. @BMIFA @ Facility age limit for growth %iles is 20 years. Facility age limit for growth %iles is 20 years.  Medical Hx:  Past Medical History:  Diagnosis Date   Anxiety    Breast cancer (HCC)    Chronic pain    Diabetes mellitus    type 2   Fatty liver    GERD (gastroesophageal reflux disease)    Herpes    Hiatal hernia 11/15/2004   small   History of asthma    History of sleep apnea    HTN (hypertension)    Hyperlipidemia    Migraine    Obesity    Sleep apnea    no CPAP    Stage 3 chronic kidney disease (HCC)    Type 2 diabetes mellitus (HCC)     Medications:  Current Outpatient Medications on File Prior to Visit  Medication Sig Dispense Refill   albuterol (VENTOLIN HFA) 108 (90 Base) MCG/ACT inhaler Inhale 1-2 puffs into the lungs every 6 (six) hours as needed for wheezing or shortness of breath.     allopurinol (ZYLOPRIM) 100 MG tablet Take 100 mg by mouth daily.     esomeprazole (NEXIUM) 40 MG capsule Take 40 mg by mouth daily before breakfast.     hydrochlorothiazide 25 MG tablet Take 25 mg by mouth daily as needed (ankle swelling).     metoprolol tartrate (LOPRESSOR) 50 MG tablet Take 50 mg by mouth 2 (two) times daily.     polyethylene glycol (MIRALAX / GLYCOLAX) 17 g packet Take 17 g by mouth daily.     rosuvastatin (CRESTOR) 5 MG tablet Take 5 mg by mouth every Wednesday.     tirzepatide (MOUNJARO) 10 MG/0.5ML Pen Inject 10 mg into the skin once a week.     ALPRAZolam (XANAX) 0.5 MG tablet Take 0.5 mg by mouth 2 (two) times daily as needed for anxiety or sleep. (Patient not  taking: Reported on 11/02/2023)     glimepiride (AMARYL) 2 MG tablet Take 2 mg by mouth daily with breakfast. (Patient not taking: Reported on 11/02/2023)     ibuprofen (ADVIL) 800 MG tablet Take 1 tablet (800 mg total) by mouth every 8 (eight) hours as needed. 30 tablet 0   lisinopril (ZESTRIL) 20 MG tablet Take 20 mg by mouth daily. (Patient not taking: Reported on 11/02/2023)     metFORMIN (GLUCOPHAGE) 500 MG tablet Take 500 mg by mouth daily. (Patient not taking: Reported on 11/02/2023)     No current facility-administered medications on file prior to visit.    Labs:A1C 6.6%  Notable Signs/Symptoms: Hair falling out, fatigue,   Lifestyle & Dietary Hx Lives by herself. Eats meals at home.  Estimated daily fluid intake: 80 oz Supplements:  Sleep:  Stress / self-care:  Current average weekly physical activity: Walks some on treadmill  24-Hr Dietary Recall First Meal: B) 2  boiled eggs, 4 unsalted crackers, 2 cups coffee Snack: 16 oz of water with lemon Second Meal: Steak, cabbage and onins, or spinach/onions 16 oz water Snack:  Third Meal: Broccoli and cheese, 16 oz of lemon juice Snack:  hot cocoa with marshmallows, 1 gram crackers Beverages: water  Estimated Energy Needs Calories: 1200 Carbohydrate: 135g Protein: 90g Fat: 33g   NUTRITION DIAGNOSIS  NB-1.1 Food and nutrition-related knowledge deficit As related to DM Type 2, Obesity and fast weight loss.  As evidenced by Poor insuffient diet to meet nutritional needs 30 lbs in 2 months, A1C 6.6% and Fatty Liver, .   NUTRITION INTERVENTION  Nutrition education (E-1) on the following topics:  Nutrition and Diabetes education provided on My Plate, CHO counting, meal planning, portion sizes, timing of meals, avoiding snacks between meals unless having a low blood sugar, target ranges for A1C and blood sugars, signs/symptoms and treatment of hyper/hypoglycemia, monitoring blood sugars, taking medications as prescribed, benefits of exercising 30 minutes per day and prevention of complications of DM.  Lifestyle Medicine  - Whole Food, Plant Predominant Nutrition is highly recommended: Eat Plenty of vegetables, Mushrooms, fruits, Legumes, Whole Grains, Nuts, seeds in lieu of processed meats, processed snacks/pastries red meat, poultry, eggs.    -It is better to avoid simple carbohydrates including: Cakes, Sweet Desserts, Ice Cream, Soda (diet and regular), Sweet Tea, Candies, Chips, Cookies, Store Bought Juices, Alcohol in Excess of  1-2 drinks a day, Lemonade,  Artificial Sweeteners, Doughnuts, Coffee Creamers, "Sugar-free" Products, etc, etc.  This is not a complete list.....  Exercise: If you are able: 30 -60 minutes a day ,4 days a week, or 150 minutes a week.  The longer the better.  Combine stretch, strength, and aerobic activities.  If you were told in the past that you have high risk for cardiovascular  diseases, you may seek evaluation by your heart doctor prior to initiating moderate to intense exercise programs.   Handouts Provided Include  Lifestyle Medicine handouts   Learning Style & Readiness for Change Teaching method utilized: Visual & Auditory  Demonstrated degree of understanding via: Teach Back  Barriers to learning/adherence to lifestyle change: None  Goals Established by Pt Goals  Eat more whole plant based foods Eat 1 piece of fruit with each meal and 2 vegetables with lunch and dinner Increase dried beans, peas, lentils. Walk to a mile in am and 1 mile in pm on treadmill Cut out cheese and sugar Increase high fiber foods.   MONITORING & EVALUATION Dietary intake, weekly physical activity,  and weight in 1 month.  Recommend to check a Vit D level, B12 and pre Albumin level to better assess her nutritional status.  Next Steps  Patient is to work on eating better balanced meals to improve overall nutrition and prevent malnutrition.

## 2023-11-15 DIAGNOSIS — E1165 Type 2 diabetes mellitus with hyperglycemia: Secondary | ICD-10-CM | POA: Diagnosis not present

## 2023-11-18 DIAGNOSIS — G4733 Obstructive sleep apnea (adult) (pediatric): Secondary | ICD-10-CM | POA: Diagnosis not present

## 2023-12-12 ENCOUNTER — Encounter: Payer: 59 | Attending: Family Medicine | Admitting: Nutrition

## 2023-12-12 ENCOUNTER — Encounter: Payer: Self-pay | Admitting: Nutrition

## 2023-12-12 VITALS — Ht 61.0 in | Wt 212.8 lb

## 2023-12-12 DIAGNOSIS — E6609 Other obesity due to excess calories: Secondary | ICD-10-CM | POA: Insufficient documentation

## 2023-12-12 DIAGNOSIS — E66811 Obesity, class 1: Secondary | ICD-10-CM | POA: Insufficient documentation

## 2023-12-12 DIAGNOSIS — E7800 Pure hypercholesterolemia, unspecified: Secondary | ICD-10-CM | POA: Diagnosis not present

## 2023-12-12 DIAGNOSIS — K760 Fatty (change of) liver, not elsewhere classified: Secondary | ICD-10-CM | POA: Diagnosis not present

## 2023-12-12 DIAGNOSIS — E118 Type 2 diabetes mellitus with unspecified complications: Secondary | ICD-10-CM | POA: Insufficient documentation

## 2023-12-12 NOTE — Progress Notes (Signed)
 Medical Nutrition Therapy  Appointment Start time:  0930  Appointment End time:  1030  Primary concerns today: Type 2 DM, Obesity, Fatty LIver  Referral diagnosis: E11.32, N18.3, K76 Preferred learning style: No preference  Learning readiness: Ready    NUTRITION ASSESSMENT  Lost 5 lbs. Lost a total of about 30-35 lbs in the last 3 months. Walks a mile on the treadmill. Has been eating more fruit, nuts and vegetables and whole plant based foods..Cut out hot chocolate. Is eating more dried beans, and chicken and is getting rid of red meat. Likes tumeric in eggs in am. Still taking Mounjero. Has gone down to a size 18 from 22. Clothes are looser and skin is becoming saggy. Still has neuropathy in her feet and limits her ability to walk for long periods of time. Would like to come off her fluid pill and possible cholesterol medication. Blood sugar readings avg is 116 mg/dl for the last 30 days. Estimated A1C is close to 5.7% or below. To see Ms. Woodson next month for labs. .  Goals set previously: Eat more whole plant based foods- working on it. Eat 1 piece of fruit with each meal and 2 vegetables with lunch and dinner- done Increase dried beans, peas, lentils.-done Walk to a mile in am and 1 mile in pm on treadmill-done Cut out cheese and sugar-done Increase high fiber foods.-done Clinical  Wt Readings from Last 3 Encounters:  11/02/23 217 lb (98.4 kg)  08/24/23 251 lb 12.8 oz (114.2 kg)  06/15/22 251 lb 12.8 oz (114.2 kg)   Ht Readings from Last 3 Encounters:  11/02/23 5\' 1"  (1.549 m)  08/24/23 5\' 1"  (1.549 m)  06/15/22 5\' 1"  (1.549 m)   There is no height or weight on file to calculate BMI. @BMIFA @ Facility age limit for growth %iles is 20 years. Facility age limit for growth %iles is 20 years.  Medical Hx:  Past Medical History:  Diagnosis Date   Anxiety    Breast cancer (HCC)    Chronic pain    Diabetes mellitus    type 2   Fatty liver    GERD  (gastroesophageal reflux disease)    Herpes    Hiatal hernia 11/15/2004   small   History of asthma    History of sleep apnea    HTN (hypertension)    Hyperlipidemia    Migraine    Obesity    Sleep apnea    no CPAP   Stage 3 chronic kidney disease (HCC)    Type 2 diabetes mellitus (HCC)     Medications:  Current Outpatient Medications on File Prior to Visit  Medication Sig Dispense Refill   albuterol (VENTOLIN HFA) 108 (90 Base) MCG/ACT inhaler Inhale 1-2 puffs into the lungs every 6 (six) hours as needed for wheezing or shortness of breath.     allopurinol (ZYLOPRIM) 100 MG tablet Take 100 mg by mouth daily.     ALPRAZolam (XANAX) 0.5 MG tablet Take 0.5 mg by mouth 2 (two) times daily as needed for anxiety or sleep. (Patient not taking: Reported on 11/02/2023)     esomeprazole (NEXIUM) 40 MG capsule Take 40 mg by mouth daily before breakfast.     glimepiride (AMARYL) 2 MG tablet Take 2 mg by mouth daily with breakfast. (Patient not taking: Reported on 11/02/2023)     hydrochlorothiazide 25 MG tablet Take 25 mg by mouth daily as needed (ankle swelling).     ibuprofen (ADVIL) 800 MG tablet Take 1  tablet (800 mg total) by mouth every 8 (eight) hours as needed. 30 tablet 0   lisinopril (ZESTRIL) 20 MG tablet Take 20 mg by mouth daily. (Patient not taking: Reported on 11/02/2023)     metFORMIN (GLUCOPHAGE) 500 MG tablet Take 500 mg by mouth daily. (Patient not taking: Reported on 11/02/2023)     metoprolol tartrate (LOPRESSOR) 50 MG tablet Take 50 mg by mouth 2 (two) times daily.     polyethylene glycol (MIRALAX / GLYCOLAX) 17 g packet Take 17 g by mouth daily.     rosuvastatin (CRESTOR) 5 MG tablet Take 5 mg by mouth every Wednesday.     tirzepatide (MOUNJARO) 10 MG/0.5ML Pen Inject 10 mg into the skin once a week.     No current facility-administered medications on file prior to visit.    Labs:A1C 6.6%  Notable Signs/Symptoms: Hair falling out, fatigue,   Lifestyle & Dietary  Hx Lives by herself. Eats meals at home.  Estimated daily fluid intake: 80 oz Supplements:  Sleep:  Stress / self-care:  Current average weekly physical activity: Walks some on treadmill  24-Hr Dietary Recall First Meal: B) 2 boiled eggs, 4 unsalted crackers, 2 cups coffee Snack: 16 oz of water with lemon, fruit, Second Meal: chicken, cabbage and onions, or spinach/onions 16 oz water Snack:  Third Meal: Broccoli , chicken, , 16 oz of lemon juice Snack:  Estimated Energy Needs Calories: 1200 Carbohydrate: 135g Protein: 90g Fat: 33g   NUTRITION DIAGNOSIS  NB-1.1 Food and nutrition-related knowledge deficit As related to DM Type 2, Obesity and fast weight loss.  As evidenced by Poor insuffient diet to meet nutritional needs 30 lbs in 2 months, A1C 6.6% and Fatty Liver, .   NUTRITION INTERVENTION  Nutrition education (E-1) on the following topics:  Nutrition and Diabetes education provided on My Plate, CHO counting, meal planning, portion sizes, timing of meals, avoiding snacks between meals unless having a low blood sugar, target ranges for A1C and blood sugars, signs/symptoms and treatment of hyper/hypoglycemia, monitoring blood sugars, taking medications as prescribed, benefits of exercising 30 minutes per day and prevention of complications of DM.  Lifestyle Medicine  - Whole Food, Plant Predominant Nutrition is highly recommended: Eat Plenty of vegetables, Mushrooms, fruits, Legumes, Whole Grains, Nuts, seeds in lieu of processed meats, processed snacks/pastries red meat, poultry, eggs.    -It is better to avoid simple carbohydrates including: Cakes, Sweet Desserts, Ice Cream, Soda (diet and regular), Sweet Tea, Candies, Chips, Cookies, Store Bought Juices, Alcohol in Excess of  1-2 drinks a day, Lemonade,  Artificial Sweeteners, Doughnuts, Coffee Creamers, "Sugar-free" Products, etc, etc.  This is not a complete list.....  Exercise: If you are able: 30 -60 minutes a day ,4  days a week, or 150 minutes a week.  The longer the better.  Combine stretch, strength, and aerobic activities.  If you were told in the past that you have high risk for cardiovascular diseases, you may seek evaluation by your heart doctor prior to initiating moderate to intense exercise programs.   Handouts Provided Include  Lifestyle Medicine handouts   Learning Style & Readiness for Change Teaching method utilized: Visual & Auditory  Demonstrated degree of understanding via: Teach Back  Barriers to learning/adherence to lifestyle change: None  Goals Established by Pt  Start back at the [facility] times per week Increase fruits, vegetables and high fiber foods  Use hand weights to do chair exercises Stop testing blood sugars   MONITORING & EVALUATION Dietary intake,  weekly physical activity, and weight in 1 month.  Recommend to check a Vit D level, B12 and pre Albumin level to better assess her nutritional status. Also recommend to consider stopping her hydrochlorothiazide and her cholesterol medication since diet improvements and weight loss.  Next Steps  Patient is to work on eating better balanced meals to improve overall nutrition and prevent malnutrition.

## 2023-12-12 NOTE — Patient Instructions (Addendum)
 Goals Start back at the [facility] times per week Increase fruits, vegetables and high fiber foods  Use hand weights to do chair exercises Stop testing blood sugars.

## 2023-12-15 DIAGNOSIS — E1165 Type 2 diabetes mellitus with hyperglycemia: Secondary | ICD-10-CM | POA: Diagnosis not present

## 2023-12-18 DIAGNOSIS — G4733 Obstructive sleep apnea (adult) (pediatric): Secondary | ICD-10-CM | POA: Diagnosis not present

## 2024-01-08 DIAGNOSIS — N3281 Overactive bladder: Secondary | ICD-10-CM | POA: Diagnosis not present

## 2024-01-08 DIAGNOSIS — I10 Essential (primary) hypertension: Secondary | ICD-10-CM | POA: Diagnosis not present

## 2024-01-08 DIAGNOSIS — E1165 Type 2 diabetes mellitus with hyperglycemia: Secondary | ICD-10-CM | POA: Diagnosis not present

## 2024-01-08 DIAGNOSIS — E7800 Pure hypercholesterolemia, unspecified: Secondary | ICD-10-CM | POA: Diagnosis not present

## 2024-01-08 DIAGNOSIS — Z299 Encounter for prophylactic measures, unspecified: Secondary | ICD-10-CM | POA: Diagnosis not present

## 2024-01-14 DIAGNOSIS — E1165 Type 2 diabetes mellitus with hyperglycemia: Secondary | ICD-10-CM | POA: Diagnosis not present

## 2024-02-14 DIAGNOSIS — E1165 Type 2 diabetes mellitus with hyperglycemia: Secondary | ICD-10-CM | POA: Diagnosis not present

## 2024-02-16 DIAGNOSIS — R300 Dysuria: Secondary | ICD-10-CM | POA: Diagnosis not present

## 2024-02-16 DIAGNOSIS — R3915 Urgency of urination: Secondary | ICD-10-CM | POA: Diagnosis not present

## 2024-02-16 DIAGNOSIS — R350 Frequency of micturition: Secondary | ICD-10-CM | POA: Diagnosis not present

## 2024-02-16 DIAGNOSIS — N3020 Other chronic cystitis without hematuria: Secondary | ICD-10-CM | POA: Diagnosis not present

## 2024-03-05 DIAGNOSIS — Z299 Encounter for prophylactic measures, unspecified: Secondary | ICD-10-CM | POA: Diagnosis not present

## 2024-03-05 DIAGNOSIS — R5383 Other fatigue: Secondary | ICD-10-CM | POA: Diagnosis not present

## 2024-03-05 DIAGNOSIS — Z0000 Encounter for general adult medical examination without abnormal findings: Secondary | ICD-10-CM | POA: Diagnosis not present

## 2024-03-05 DIAGNOSIS — I10 Essential (primary) hypertension: Secondary | ICD-10-CM | POA: Diagnosis not present

## 2024-03-05 DIAGNOSIS — Z7189 Other specified counseling: Secondary | ICD-10-CM | POA: Diagnosis not present

## 2024-03-16 DIAGNOSIS — E1165 Type 2 diabetes mellitus with hyperglycemia: Secondary | ICD-10-CM | POA: Diagnosis not present

## 2024-03-29 DIAGNOSIS — L84 Corns and callosities: Secondary | ICD-10-CM | POA: Diagnosis not present

## 2024-03-29 DIAGNOSIS — Z299 Encounter for prophylactic measures, unspecified: Secondary | ICD-10-CM | POA: Diagnosis not present

## 2024-03-29 DIAGNOSIS — G620 Drug-induced polyneuropathy: Secondary | ICD-10-CM | POA: Diagnosis not present

## 2024-03-29 DIAGNOSIS — E1169 Type 2 diabetes mellitus with other specified complication: Secondary | ICD-10-CM | POA: Diagnosis not present

## 2024-03-29 DIAGNOSIS — I10 Essential (primary) hypertension: Secondary | ICD-10-CM | POA: Diagnosis not present

## 2024-04-15 DIAGNOSIS — E1165 Type 2 diabetes mellitus with hyperglycemia: Secondary | ICD-10-CM | POA: Diagnosis not present

## 2024-04-18 DIAGNOSIS — Z1211 Encounter for screening for malignant neoplasm of colon: Secondary | ICD-10-CM | POA: Diagnosis not present

## 2024-04-29 ENCOUNTER — Ambulatory Visit (INDEPENDENT_AMBULATORY_CARE_PROVIDER_SITE_OTHER): Admitting: Podiatry

## 2024-04-29 ENCOUNTER — Encounter: Payer: Self-pay | Admitting: Podiatry

## 2024-04-29 DIAGNOSIS — M79675 Pain in left toe(s): Secondary | ICD-10-CM

## 2024-04-29 DIAGNOSIS — B351 Tinea unguium: Secondary | ICD-10-CM | POA: Diagnosis not present

## 2024-04-29 DIAGNOSIS — M79674 Pain in right toe(s): Secondary | ICD-10-CM

## 2024-04-30 NOTE — Progress Notes (Signed)
 Subjective:   Patient ID: Beth Phelps, female   DOB: 73 y.o.   MRN: [id number]   HPI Patient doing excellent with diabetes A1c under 6 with elongated thickened nailbeds 1-5 both feet   ROS      Objective:  Physical Exam  Neurovascular status intact with excellent control diabetes thick yellow brittle nailbeds 1-5 both feet     Assessment:  Chronic mycotic nail infection with pain 1-5 both feet     Plan:  Debridement of lesions no iatrogenic bleeding reappoint routine care

## 2024-05-16 DIAGNOSIS — E1165 Type 2 diabetes mellitus with hyperglycemia: Secondary | ICD-10-CM | POA: Diagnosis not present

## 2024-05-17 DIAGNOSIS — Z87891 Personal history of nicotine dependence: Secondary | ICD-10-CM | POA: Diagnosis not present

## 2024-05-17 DIAGNOSIS — E785 Hyperlipidemia, unspecified: Secondary | ICD-10-CM | POA: Diagnosis not present

## 2024-05-17 DIAGNOSIS — Z1211 Encounter for screening for malignant neoplasm of colon: Secondary | ICD-10-CM | POA: Diagnosis not present

## 2024-05-17 DIAGNOSIS — Z7984 Long term (current) use of oral hypoglycemic drugs: Secondary | ICD-10-CM | POA: Diagnosis not present

## 2024-05-17 DIAGNOSIS — K635 Polyp of colon: Secondary | ICD-10-CM | POA: Diagnosis not present

## 2024-05-17 DIAGNOSIS — E119 Type 2 diabetes mellitus without complications: Secondary | ICD-10-CM | POA: Diagnosis not present

## 2024-05-17 DIAGNOSIS — D122 Benign neoplasm of ascending colon: Secondary | ICD-10-CM | POA: Diagnosis not present

## 2024-05-17 DIAGNOSIS — Z853 Personal history of malignant neoplasm of breast: Secondary | ICD-10-CM | POA: Diagnosis not present

## 2024-05-17 DIAGNOSIS — G4733 Obstructive sleep apnea (adult) (pediatric): Secondary | ICD-10-CM | POA: Diagnosis not present

## 2024-05-17 DIAGNOSIS — I10 Essential (primary) hypertension: Secondary | ICD-10-CM | POA: Diagnosis not present

## 2024-05-17 DIAGNOSIS — D126 Benign neoplasm of colon, unspecified: Secondary | ICD-10-CM | POA: Diagnosis not present

## 2024-05-17 DIAGNOSIS — Z79899 Other long term (current) drug therapy: Secondary | ICD-10-CM | POA: Diagnosis not present

## 2024-05-17 DIAGNOSIS — I2510 Atherosclerotic heart disease of native coronary artery without angina pectoris: Secondary | ICD-10-CM | POA: Diagnosis not present

## 2024-05-17 DIAGNOSIS — Z7985 Long-term (current) use of injectable non-insulin antidiabetic drugs: Secondary | ICD-10-CM | POA: Diagnosis not present

## 2024-05-17 DIAGNOSIS — D123 Benign neoplasm of transverse colon: Secondary | ICD-10-CM | POA: Diagnosis not present

## 2024-05-17 DIAGNOSIS — Z880 Allergy status to penicillin: Secondary | ICD-10-CM | POA: Diagnosis not present

## 2024-05-17 DIAGNOSIS — K6389 Other specified diseases of intestine: Secondary | ICD-10-CM | POA: Diagnosis not present

## 2024-05-17 DIAGNOSIS — D127 Benign neoplasm of rectosigmoid junction: Secondary | ICD-10-CM | POA: Diagnosis not present

## 2024-05-30 DIAGNOSIS — I10 Essential (primary) hypertension: Secondary | ICD-10-CM | POA: Diagnosis not present

## 2024-05-30 DIAGNOSIS — L84 Corns and callosities: Secondary | ICD-10-CM | POA: Diagnosis not present

## 2024-05-30 DIAGNOSIS — E119 Type 2 diabetes mellitus without complications: Secondary | ICD-10-CM | POA: Diagnosis not present

## 2024-05-30 DIAGNOSIS — Z299 Encounter for prophylactic measures, unspecified: Secondary | ICD-10-CM | POA: Diagnosis not present

## 2024-05-30 DIAGNOSIS — D369 Benign neoplasm, unspecified site: Secondary | ICD-10-CM | POA: Diagnosis not present

## 2024-06-11 ENCOUNTER — Encounter: Payer: Self-pay | Admitting: Nutrition

## 2024-06-11 ENCOUNTER — Encounter: Payer: 59 | Attending: Family Medicine | Admitting: Nutrition

## 2024-06-11 VITALS — Ht 61.0 in | Wt 181.0 lb

## 2024-06-11 DIAGNOSIS — K760 Fatty (change of) liver, not elsewhere classified: Secondary | ICD-10-CM | POA: Diagnosis not present

## 2024-06-11 DIAGNOSIS — E7800 Pure hypercholesterolemia, unspecified: Secondary | ICD-10-CM | POA: Insufficient documentation

## 2024-06-11 DIAGNOSIS — E6609 Other obesity due to excess calories: Secondary | ICD-10-CM | POA: Diagnosis present

## 2024-06-11 DIAGNOSIS — E118 Type 2 diabetes mellitus with unspecified complications: Secondary | ICD-10-CM | POA: Diagnosis not present

## 2024-06-11 DIAGNOSIS — E66811 Other obesity due to excess calories: Secondary | ICD-10-CM | POA: Insufficient documentation

## 2024-06-11 NOTE — Progress Notes (Signed)
 Medical Nutrition Therapy  Appointment Start time:  1050   Appointment End time: 1115  Primary concerns today: Type 2 DM, Obesity, Fatty LIver  Referral diagnosis: E11.32, N18.3, K76 Preferred learning style: No preference  Learning readiness: Ready    NUTRITION ASSESSMENT I just had colonoscopy a few weeks ago and took out 14 ployps. All were fine.  Has lost 31 lbs in the last 6 months. Has lost a total of 70 lbs in the last year. On Moujero 10 mg. Last A1C was felt to be 5.1 or 5.2%. Has reversed her DM. No longer on Crestor. Fluid pill PRN. Feels much better Changes made with eating habits:  Eating 3 meals and eating more fruits, vegetables and whole grains. Cut out junk food, soda,and tea. Just drinks water  with lemon juice. Clothes are down to size 14. Sleeping better.   Ready to have knee surgery.   Clinical  Wt Readings from Last 3 Encounters:  06/11/24 181 lb (82.1 kg)  12/12/23 212 lb 12.8 oz (96.5 kg)  11/02/23 217 lb (98.4 kg)   Ht Readings from Last 3 Encounters:  06/11/24 5' 1 (1.549 m)  12/12/23 5' 1 (1.549 m)  11/02/23 5' 1 (1.549 m)   Body mass index is 34.2 kg/m. @BMIFA @ Facility age limit for growth %iles is 20 years. Facility age limit for growth %iles is 20 years.  Medical Hx:  Past Medical History:  Diagnosis Date   Anxiety    Breast cancer (HCC)    Chronic pain    Diabetes mellitus    type 2   Fatty liver    GERD (gastroesophageal reflux disease)    Herpes    Hiatal hernia 11/15/2004   small   History of asthma    History of sleep apnea    HTN (hypertension)    Hyperlipidemia    Migraine    Obesity    Sleep apnea    no CPAP   Stage 3 chronic kidney disease (HCC)    Type 2 diabetes mellitus (HCC)     Medications:  Current Outpatient Medications on File Prior to Visit  Medication Sig Dispense Refill   albuterol (VENTOLIN HFA) 108 (90 Base) MCG/ACT inhaler Inhale 1-2 puffs into the lungs every 6 (six) hours as needed for  wheezing or shortness of breath.     allopurinol (ZYLOPRIM) 100 MG tablet Take 100 mg by mouth daily.     esomeprazole (NEXIUM) 40 MG capsule Take 40 mg by mouth daily before breakfast.     estradiol (ESTRACE) 0.1 MG/GM vaginal cream Place 1 g vaginally at bedtime.     hydrochlorothiazide  25 MG tablet Take 25 mg by mouth daily as needed (ankle swelling).     ibuprofen  (ADVIL ) 800 MG tablet Take 1 tablet (800 mg total) by mouth every 8 (eight) hours as needed. 30 tablet 0   metoprolol  tartrate (LOPRESSOR ) 50 MG tablet Take 50 mg by mouth 2 (two) times daily.     MYRBETRIQ 50 MG TB24 tablet Take 50 mg by mouth daily.     rosuvastatin (CRESTOR) 5 MG tablet Take 5 mg by mouth every Wednesday.     tirzepatide (MOUNJARO) 10 MG/0.5ML Pen Inject 10 mg into the skin once a week.     No current facility-administered medications on file prior to visit.    Labs:A1C 6.6% now down to 5.1-5.2% per pt  CHOL WNL All labs WNL.  Notable Signs/Symptoms: none Lifestyle & Dietary Hx Lives by herself. Eats meals at home.  Estimated daily fluid intake: 80 oz Supplements:  Sleep:  Stress / self-care:  Current average weekly physical activity: Walks some on treadmill  24-Hr Dietary Recall First Meal: B) 2 boiled eggs, fruit L) steak and cabbage, water  D) Deviled eggs, garlic bread,  Greek yogurt, Drinking water  only with lemon  Estimated Energy Needs Calories: 1200 Carbohydrate: 135g Protein: 90g Fat: 33g   NUTRITION DIAGNOSIS  NB-1.1 Food and nutrition-related knowledge deficit As related to DM Type 2, Obesity and fast weight loss.  As evidenced by Poor insuffient diet to meet nutritional needs 30 lbs in 2 months, A1C 6.6% and Fatty Liver, .   NUTRITION INTERVENTION  Nutrition education (E-1) on the following topics:  Nutrition and Diabetes education provided on My Plate, CHO counting, meal planning, portion sizes, timing of meals, avoiding snacks between meals unless having a low blood sugar,  target ranges for A1C and blood sugars, signs/symptoms and treatment of hyper/hypoglycemia, monitoring blood sugars, taking medications as prescribed, benefits of exercising 30 minutes per day and prevention of complications of DM.  Lifestyle Medicine  - Whole Food, Plant Predominant Nutrition is highly recommended: Eat Plenty of vegetables, Mushrooms, fruits, Legumes, Whole Grains, Nuts, seeds in lieu of processed meats, processed snacks/pastries red meat, poultry, eggs.    -It is better to avoid simple carbohydrates including: Cakes, Sweet Desserts, Ice Cream, Soda (diet and regular), Sweet Tea, Candies, Chips, Cookies, Store Bought Juices, Alcohol in Excess of  1-2 drinks a day, Lemonade,  Artificial Sweeteners, Doughnuts, Coffee Creamers, Sugar-free Products, etc, etc.  This is not a complete list.....  Exercise: If you are able: 30 -60 minutes a day ,4 days a week, or 150 minutes a week.  The longer the better.  Combine stretch, strength, and aerobic activities.  If you were told in the past that you have high risk for cardiovascular diseases, you may seek evaluation by your heart doctor prior to initiating moderate to intense exercise programs.   Handouts Provided Include  Congrats Certificate  Learning Style & Readiness for Change Teaching method utilized: Visual & Auditory  Demonstrated degree of understanding via: Teach Back  Barriers to learning/adherence to lifestyle change: None  Goals Established by Pt Keep up the great job!!! [facility].  MONITORING & EVALUATION Dietary intake, weekly physical activity, and weight in PRN  Next Steps  Patient is to work on eating better balanced meals to improve overall nutrition and prevent malnutrition.

## 2024-06-11 NOTE — Patient Instructions (Signed)
 Keep up the great job!

## 2024-06-15 DIAGNOSIS — E1165 Type 2 diabetes mellitus with hyperglycemia: Secondary | ICD-10-CM | POA: Diagnosis not present

## 2024-06-20 DIAGNOSIS — Z9221 Personal history of antineoplastic chemotherapy: Secondary | ICD-10-CM | POA: Diagnosis not present

## 2024-06-20 DIAGNOSIS — Z853 Personal history of malignant neoplasm of breast: Secondary | ICD-10-CM | POA: Diagnosis not present

## 2024-06-20 DIAGNOSIS — E119 Type 2 diabetes mellitus without complications: Secondary | ICD-10-CM | POA: Diagnosis not present

## 2024-06-20 DIAGNOSIS — Z87891 Personal history of nicotine dependence: Secondary | ICD-10-CM | POA: Diagnosis not present

## 2024-06-20 DIAGNOSIS — I10 Essential (primary) hypertension: Secondary | ICD-10-CM | POA: Diagnosis not present

## 2024-06-20 DIAGNOSIS — M1991 Primary osteoarthritis, unspecified site: Secondary | ICD-10-CM | POA: Diagnosis not present

## 2024-06-20 DIAGNOSIS — Z923 Personal history of irradiation: Secondary | ICD-10-CM | POA: Diagnosis not present

## 2024-06-20 DIAGNOSIS — Z743 Need for continuous supervision: Secondary | ICD-10-CM | POA: Diagnosis not present

## 2024-06-20 DIAGNOSIS — R110 Nausea: Secondary | ICD-10-CM | POA: Diagnosis not present

## 2024-06-20 DIAGNOSIS — K5989 Other specified functional intestinal disorders: Secondary | ICD-10-CM | POA: Diagnosis not present

## 2024-06-20 DIAGNOSIS — Z9049 Acquired absence of other specified parts of digestive tract: Secondary | ICD-10-CM | POA: Diagnosis not present

## 2024-06-20 DIAGNOSIS — R1033 Periumbilical pain: Secondary | ICD-10-CM | POA: Diagnosis not present

## 2024-06-20 DIAGNOSIS — R197 Diarrhea, unspecified: Secondary | ICD-10-CM | POA: Diagnosis not present

## 2024-06-20 DIAGNOSIS — K7689 Other specified diseases of liver: Secondary | ICD-10-CM | POA: Diagnosis not present

## 2024-06-20 DIAGNOSIS — K6389 Other specified diseases of intestine: Secondary | ICD-10-CM | POA: Diagnosis not present

## 2024-06-20 DIAGNOSIS — K529 Noninfective gastroenteritis and colitis, unspecified: Secondary | ICD-10-CM | POA: Diagnosis not present

## 2024-07-04 DIAGNOSIS — Z299 Encounter for prophylactic measures, unspecified: Secondary | ICD-10-CM | POA: Diagnosis not present

## 2024-07-04 DIAGNOSIS — I10 Essential (primary) hypertension: Secondary | ICD-10-CM | POA: Diagnosis not present

## 2024-07-04 DIAGNOSIS — Z9889 Other specified postprocedural states: Secondary | ICD-10-CM | POA: Diagnosis not present

## 2024-07-04 DIAGNOSIS — E1169 Type 2 diabetes mellitus with other specified complication: Secondary | ICD-10-CM | POA: Diagnosis not present

## 2024-07-04 DIAGNOSIS — R197 Diarrhea, unspecified: Secondary | ICD-10-CM | POA: Diagnosis not present

## 2024-07-04 DIAGNOSIS — R109 Unspecified abdominal pain: Secondary | ICD-10-CM | POA: Diagnosis not present

## 2024-07-09 ENCOUNTER — Encounter: Payer: Self-pay | Admitting: Gastroenterology

## 2024-07-16 DIAGNOSIS — E1165 Type 2 diabetes mellitus with hyperglycemia: Secondary | ICD-10-CM | POA: Diagnosis not present

## 2024-08-14 ENCOUNTER — Ambulatory Visit: Payer: Self-pay | Admitting: Gastroenterology

## 2024-08-14 ENCOUNTER — Encounter: Payer: Self-pay | Admitting: *Deleted

## 2024-08-14 ENCOUNTER — Ambulatory Visit (INDEPENDENT_AMBULATORY_CARE_PROVIDER_SITE_OTHER): Admitting: Gastroenterology

## 2024-08-14 ENCOUNTER — Encounter: Payer: Self-pay | Admitting: Gastroenterology

## 2024-08-14 ENCOUNTER — Ambulatory Visit (HOSPITAL_COMMUNITY)
Admission: RE | Admit: 2024-08-14 | Discharge: 2024-08-14 | Disposition: A | Discharge status: Home / Self Care | Source: Ambulatory Visit | Attending: Gastroenterology | Admitting: Gastroenterology

## 2024-08-14 VITALS — BP 133/86 | HR 101 | Temp 97.9°F | Ht 61.0 in | Wt 183.6 lb

## 2024-08-14 DIAGNOSIS — R600 Localized edema: Secondary | ICD-10-CM | POA: Insufficient documentation

## 2024-08-14 DIAGNOSIS — R197 Diarrhea, unspecified: Secondary | ICD-10-CM | POA: Insufficient documentation

## 2024-08-14 DIAGNOSIS — R7689 Other specified abnormal immunological findings in serum: Secondary | ICD-10-CM

## 2024-08-14 NOTE — Progress Notes (Unsigned)
 GI Office Note    Referring Provider: Leavy Beth NOVAK, FNP Primary Care Physician:  [facility] Beth NOVAK, FNP  Primary Gastroenterologist:  Chief Complaint   Chief Complaint  Patient presents with   Diarrhea    Watery diarrhea      History of Present Illness   [facility] is a 73 y.o. female presenting today at the request of Beth Leavy, FNP for further evaluation of diarrhea and abdominal pain.  Seen by PCP 07/04/24 for ED follow up. Presented to ED at [facility] 06/20/24 with complaints of 12 hours of diarrhea, nausea. Had recently visited [facility]. Had to call EMS. She was concerned something wrong with her pancreas.   CT A/P with IV contrast 06/20/24: Fluid-filled loops of small bowel and colon concerning for a nonspecific enteritis. No pneumatosis or pneumoperitoneum.   06/20/24: GIP negative Cdiff PCR negative.  Lipase 27  Sodium 139, potassium 3.9, creatinine 0.66, albumin 3.5, total bilirubin 0.8, alk phos 107, AST 35, ALT 39 White blood cell count 6.9, hemoglobin 14.1, platelets 188. Covid/Flu A/Flu B/RSV negative  Keflex one year. Mounjaro 1 year. Off for 3 weeks [facility] pancreatitis   Discussed the use of AI scribe software for clinical note transcription with the patient, who gave verbal consent to proceed.  Over the past two months, she has experienced three episodes of severe diarrhea, each lasting a couple of days, with more than ten watery stools per day, including nocturnal stools. These episodes are accompanied by nausea but no vomiting. Notes abdominal cramping with diarrhea. During the first episode, she reports that imaging was performed and she was told there was some abnormality in her small intestines. There is no blood in her stools. She was concerned maybe her pancreas was an issues because her mother has had recurrent pancreatitis without known etiology (she is 46).  She has a history of constipation, typically having bowel movements every few days, which  are painful and difficult. She does not to take medications so she really does not take laxatives or stool softeners. She has gerd which is well controlled. No dysphagia.   Her past medical history includes breast cancer treated with chemotherapy and radiation three years ago, during which she developed a blood clot associated with a port-a-cath, necessitating temporary blood thinners. She has been off blood thinners since the removal of the port-a-cath. She has been on Mounjaro for over a year for weight management, which she stopped three weeks ago after onset of nausea/diarrhea. She takes a daily antibiotic, Cefalexin, for recurrent urinary tract infections and has been on it for one year. She also takes a probiotic. She does not take ibuprofen  daily, tries not to use very often.     She reports swelling in her left foot, which began a few days ago, causing tightness and difficulty walking. There is no recent injury to the foot, and her gout typically affects her hands, not her feet.  No vomiting, blood in stools, heartburn, or indigestion. Reports nausea, watery diarrhea, and abdominal cramps.     She had colonoscopy 05/17/24, was not have these episodes at that time.    Prior Data   Colonoscopy 05/17/24:Dr. Duwaine [facility]  normal polyps, needs a repeat in 2 years  Impression:            A digital rectal exam was performed.                         - Two 4 to 6 mm polyps in the transverse colon,                         removed with a cold snare. Resected and retrieved.                         - One 3 mm polyp in the transverse colon, removed with                         a cold biopsy forceps. Resected and retrieved.                         - One 7 mm polyp in the distal ascending colon,                         removed with a cold snare. Resected and retrieved.                         - One 2 mm polyp in the cecum, removed  with a cold                         biopsy forceps. Resected and retrieved.                         - One 4 mm polyp at 60 cm proximal to the anus,                         removed with a cold biopsy forceps. Resected and                         retrieved.                         - One 6 mm polyp at 40 cm proximal to the anus,                         removed with a cold snare. Resected and retrieved.                         - Two 3 to 4 mm polyps at 40 cm proximal to the anus,                         removed with a cold biopsy forceps. Resected and                         retrieved.                         - Five 3 to 4 mm polyps at the recto-sigmoid colon,                         removed with  a cold biopsy forceps. Resected and                         retrieved.   Specimen 1: Transverse Colon Polyp, x3   -  TUBULAR ADENOMA, FRAGMENTS.   Specimen 2: Ascending  Colon Polyp   -  TUBULAR ADENOMA.   Specimen 3: Cecum Polyp   -  COLONIC MUCOSA WITH MILD HYPERPLASTIC/SERRATED CHANGE, INSUFFICIENT FOR A PATHOGNOMONIC DIAGNOSIS OF SESSILE SERRATED LESION.   Specimen 4: Colon, polyp(s), 60cm   -  COLONIC MUCOSA WITH MILD SUPERFICIAL HYPERPLASTIC CHANGE.   Specimen 5: Colon, polyp(s), x3   -  TUBULAR ADENOMA, FRAGMENTS.   -  FRAGMENT OF HYPERPLASTIC/SERRATED POLYP WITH OVERLAPPING FEATURES OF A SESSILE SERRATED LESION AND A HYPERPLASTIC POLYP WITH PROLAPSE EFFECT.   Specimen 6: Colon, polyp(s), x5 recto sigmoid   -  FRAGMENTS OF HYPERPLASTIC POLYPS SOME WITH PROLAPSE EFFECT.   Medications   Current Outpatient Medications  Medication Sig Dispense Refill   albuterol (VENTOLIN HFA) 108 (90 Base) MCG/ACT inhaler Inhale 1-2 puffs into the lungs every 6 (six) hours as needed for wheezing or shortness of breath.     allopurinol (ZYLOPRIM) 100 MG tablet Take 100 mg by mouth daily.     cephALEXin (KEFLEX) 250 MG capsule Take 250 mg by mouth daily.     Cranberry-Vitamin C-Probiotic (FT CRANBERRY PLUS  PROB W/VIT C) 250-30 MG TABS Take by mouth.     esomeprazole (NEXIUM) 40 MG capsule Take 40 mg by mouth daily before breakfast.     estradiol (ESTRACE) 0.1 MG/GM vaginal cream Place 1 g vaginally at bedtime.     hydrochlorothiazide  25 MG tablet Take 25 mg by mouth daily as needed (ankle swelling).     ibuprofen  (ADVIL ) 800 MG tablet Take 1 tablet (800 mg total) by mouth every 8 (eight) hours as needed. (Patient taking differently: Take 200 mg by mouth every 8 (eight) hours as needed.) 30 tablet 0   MYRBETRIQ 50 MG TB24 tablet Take 50 mg by mouth daily.     metoprolol  tartrate (LOPRESSOR ) 50 MG tablet Take 50 mg by mouth 2 (two) times daily. (Patient not taking: Reported on 08/14/2024)     No current facility-administered medications for this visit.    Allergies   Allergies as of 08/14/2024 - Review Complete 08/14/2024  Allergen Reaction Noted   Metronidazole Rash 04/05/2020   Augmentin [amoxicillin-pot clavulanate] Rash 08/14/2024   Bactrim [sulfamethoxazole-trimethoprim] Rash 08/14/2024   Doxycycline Rash 04/05/2020    Past Medical History   Past Medical History:  Diagnosis Date   Anxiety    Breast cancer (HCC)    Chronic pain    COPD (chronic obstructive pulmonary disease) (HCC)    Diabetes mellitus    type 2   Fatty liver    GERD (gastroesophageal reflux disease)    Gout    Herpes    Hiatal hernia 11/15/2004   small   History of asthma    History of sleep apnea    HTN (hypertension)    Hyperlipidemia    Migraine    Obesity    Sleep apnea    no CPAP   Stage 3 chronic kidney disease (HCC)    Type 2 diabetes mellitus (HCC)     Past Surgical History   Past Surgical History:  Procedure Laterality Date   ABDOMINAL HYSTERECTOMY     BLADDER SURGERY     CHOLECYSTECTOMY     COLONOSCOPY  09/2012  Dr. DeMason: normal. H/O prior polyps.   COLONOSCOPY N/A 07/02/2014   Procedure: COLONOSCOPY;  Surgeon: Lamar CHRISTELLA Hollingshead, MD;  Location: [facility];  Service: Endoscopy;   Laterality: N/A;  7:30   ESOPHAGOGASTRODUODENOSCOPY  11/15/2004   Dr. Golda- small sliding hiatal hernia with mild changes of reflux esophagitis   MOLE REMOVAL     malignant   PORT-A-CATH REMOVAL Right 04/07/2021   Procedure: REMOVAL PORT-A-CATH;  Surgeon: Vanderbilt Ned, MD;  Location: [facility] OR;  Service: General;  Laterality: Right;   PORTACATH PLACEMENT N/A 11/10/2020   Procedure: INSERTION PORT-A-CATH WITH ULTRASOUND GUIDANCE;  Surgeon: Vanderbilt Ned, MD;  Location: [facility] OR;  Service: General;  Laterality: N/A;    Past Family History   Family History  Problem Relation Age of Onset   Diabetes Father    Colon polyps Father    Heart disease Neg Hx    Colon cancer Neg Hx     Past Social History   Social History   Socioeconomic History   Marital status: Divorced    Spouse name: Not on file   Number of children: 3   Years of education: Not on file   Highest education level: Not on file  Occupational History   Not on file  Tobacco Use   Smoking status: Former    Current packs/day: 0.00    Average packs/day: 1 pack/day for 20.0 years (20.0 ttl pk-yrs)    Types: Cigarettes    Start date: 07/02/1974    Quit date: 07/02/1994    Years since quitting: 30.1   Smokeless tobacco: Never  Vaping Use   Vaping status: Never Used  Substance and Sexual Activity   Alcohol use: No   Drug use: No   Sexual activity: Never    Birth control/protection: Surgical  Other Topics Concern   Not on file  Social History Narrative   Divorced   Social Drivers of Health   Financial Resource Strain: Low Risk  (03/28/2022)   Received from [facility]   Overall Financial Resource Strain (CARDIA)    Difficulty of Paying Living Expenses: Not hard at all  Food Insecurity: No Food Insecurity (06/20/2024)   Received from [facility]   Hunger Vital Sign    Within the past 12 months, you worried that your food would run out before you got the money to buy more.: Never true    Within the past 12  months, the food you bought just didn't last and you didn't have money to get more.: Never true  Transportation Needs: No Transportation Needs (06/20/2024)   Received from [facility]   PRAPARE - Transportation    Lack of Transportation (Medical): No    Lack of Transportation (Non-Medical): No  Physical Activity: Inactive (01/12/2021)   Received from [facility]   Exercise Vital Sign    On average, how many days per week do you engage in moderate to strenuous exercise (like a brisk walk)?: 0 days    On average, how many minutes do you engage in exercise at this level?: 0 min  Stress: No Stress Concern Present (01/12/2021)   Received from [facility] - Occupational Stress Questionnaire    Feeling of Stress : Only a little  Social Connections: Moderately Isolated (01/12/2021)   Received from [facility]   Social Connection and Isolation Panel    In a typical week, how many times do you talk on the phone  with family, friends, or neighbors?: More than three times a week    How often do you get together with friends or relatives?: Once a week    How often do you attend church or religious services?: More than 4 times per year    Do you belong to any clubs or organizations such as church groups, unions, fraternal or athletic groups, or school groups?: No    How often do you attend meetings of the clubs or organizations you belong to?: Never    Are you married, widowed, divorced, separated, never married, or living with a partner?: Divorced  Intimate Partner Violence: Not At Risk (05/17/2024)   Received from [facility]   Humiliation, Afraid, Rape, and Kick questionnaire    Within the last year, have you been afraid of your partner or ex-partner?: No    Within the last year, have you been humiliated or emotionally abused in other ways by your partner or ex-partner?: No    Within the last year, have you been kicked, hit, slapped, or  otherwise physically hurt by your partner or ex-partner?: No    Within the last year, have you been raped or forced to have any kind of sexual activity by your partner or ex-partner?: No    Review of Systems   General: Negative for anorexia,unintentional  weight loss, fever, chills, fatigue, weakness. Eyes: Negative for vision changes.  ENT: Negative for hoarseness, difficulty swallowing , nasal congestion. CV: Negative for chest pain, angina, palpitations, dyspnea on exertion, peripheral edema.  Respiratory: Negative for dyspnea at rest, dyspnea on exertion, cough, sputum, wheezing.  GI: See history of present illness. GU:  Negative for dysuria, hematuria, urinary incontinence, urinary frequency, nocturnal urination. Recurrent uti. MS: +chronic knee pain. Negative low back pain.  Derm: Negative for rash or itching.  Neuro: Negative for weakness, abnormal sensation, seizure, frequent headaches, memory loss,  confusion.  Psych: Negative for anxiety, depression, suicidal ideation, hallucinations.  Endo: Negative for unusual weight change.  Heme: Negative for bruising or bleeding. Allergy: Negative for rash or hives.  Physical Exam   BP 133/86 (BP Location: Right Arm, Patient Position: Sitting, Cuff Size: Large)   Pulse (!) 101   Temp 97.9 F (36.6 C) (Temporal)   Ht 5' 1 (1.549 m)   Wt 183 lb 9.6 oz (83.3 kg)   BMI 34.69 kg/m    General: Well-nourished, well-developed in no acute distress.  Head: Normocephalic, atraumatic.   Eyes: Conjunctiva pink, no icterus. Mouth: Oropharyngeal mucosa moist and pink  Neck: Supple without thyromegaly, masses, or lymphadenopathy.  Lungs: Clear to auscultation bilaterally.  Heart: Regular rate and rhythm, no murmurs rubs or gallops.  Abdomen: Bowel sounds are normal,  nondistended, no hepatosplenomegaly or masses,  no abdominal bruits or hernia, no rebound or guarding.  Minimal epigastric tenderness Rectal: not performed Extremities: LLE  edema, mostly around the ankle and up mid-calve, exam somewhat limited by body habitus for upper legs. No erythema. No warmth. Nontender. No RLE. No clubbing or deformities.  Neuro: Alert and oriented x 4 , grossly normal neurologically.  Skin: Warm and dry, no rash or jaundice.   Psych: Alert and cooperative, normal mood and affect.  Labs   See above  Imaging Studies   No results found.  Assessment/Plan:     LLE DVT Labs Stools Miralax or benefiber     Sonny RAMAN. Ezzard, MHS, PA-C [facility]

## 2024-08-14 NOTE — Patient Instructions (Signed)
 Ultrasound of your left leg to rule out blood clot. We will be in touch with results as soon as available.  Complete blood work at any time.  Pick up stool container at the lab. If you have another episode of diarrhea, please collect stool and return to the lab.  Consider taking something for constipation. Benefiber two teaspoons daily can be helpful to regulate stools. Miralax (stool softener) is also a great option, you can take one capful every day.

## 2024-08-18 LAB — TSH+FREE T4
Free T4: 1.15 ng/dL (ref 0.82–1.77)
TSH: 0.483 u[IU]/mL (ref 0.450–4.500)

## 2024-08-18 LAB — ALPHA-GAL PANEL
Allergen Lamb IgE: <0.1 kU/L
Beef IgE: <0.1 kU/L
IgE (Immunoglobulin E), Serum: 734 [IU]/mL — AB (ref 6–495)
O215-IgE Alpha-Gal: <0.1 kU/L
Pork IgE: <0.1 kU/L

## 2024-08-18 LAB — TISSUE TRANSGLUTAMINASE, IGA: Transglutaminase IgA: <2 U/mL (ref 0–3)

## 2024-08-18 LAB — IGA: IgA/Immunoglobulin A, Serum: 210 mg/dL (ref 64–422)

## 2024-09-04 ENCOUNTER — Other Ambulatory Visit: Payer: Self-pay

## 2024-09-04 DIAGNOSIS — R7689 Other specified abnormal immunological findings in serum: Secondary | ICD-10-CM

## 2024-12-04 ENCOUNTER — Encounter: Admitting: Primary Care
